# Patient Record
Sex: Female | Born: 1975
Health system: Southern US, Community
[De-identification: ages and names within clinical notes are randomized; demographics above are authoritative.]

## PROBLEM LIST (undated history)

## (undated) DIAGNOSIS — M9252 Juvenile osteochondrosis of tibia and fibula, left leg: Secondary | ICD-10-CM

## (undated) DIAGNOSIS — K0889 Other specified disorders of teeth and supporting structures: Secondary | ICD-10-CM

## (undated) DIAGNOSIS — E282 Polycystic ovarian syndrome: Secondary | ICD-10-CM

## (undated) DIAGNOSIS — G44009 Cluster headache syndrome, unspecified, not intractable: Secondary | ICD-10-CM

## (undated) DIAGNOSIS — D259 Leiomyoma of uterus, unspecified: Secondary | ICD-10-CM

## (undated) DIAGNOSIS — E119 Type 2 diabetes mellitus without complications: Secondary | ICD-10-CM

## (undated) DIAGNOSIS — K219 Gastro-esophageal reflux disease without esophagitis: Secondary | ICD-10-CM

## (undated) DIAGNOSIS — M92522 Juvenile osteochondrosis of tibia tubercle, left leg: Secondary | ICD-10-CM

## (undated) DIAGNOSIS — M1990 Unspecified osteoarthritis, unspecified site: Secondary | ICD-10-CM

## (undated) DIAGNOSIS — J302 Other seasonal allergic rhinitis: Secondary | ICD-10-CM

## (undated) HISTORY — DX: Morbid (severe) obesity due to excess calories: E66.01

## (undated) HISTORY — PX: EXTERNAL FIXATION ARM: SHX1552

## (undated) HISTORY — PX: ABDOMINAL HYSTERECTOMY: SHX81

## (undated) HISTORY — PX: WISDOM TOOTH EXTRACTION: SHX21

## (undated) HISTORY — DX: Polycystic ovarian syndrome: E28.2

---

## 1998-10-24 ENCOUNTER — Emergency Department (HOSPITAL_COMMUNITY): Admission: EM | Admit: 1998-10-24 | Discharge: 1998-10-25 | Payer: Self-pay | Admitting: Emergency Medicine

## 2000-06-11 ENCOUNTER — Encounter: Payer: Self-pay | Admitting: Emergency Medicine

## 2000-06-11 ENCOUNTER — Emergency Department (HOSPITAL_COMMUNITY): Admission: EM | Admit: 2000-06-11 | Discharge: 2000-06-11 | Payer: Self-pay | Admitting: Emergency Medicine

## 2000-08-29 ENCOUNTER — Emergency Department (HOSPITAL_COMMUNITY): Admission: EM | Admit: 2000-08-29 | Discharge: 2000-08-29 | Payer: Self-pay | Admitting: Emergency Medicine

## 2001-04-18 ENCOUNTER — Emergency Department (HOSPITAL_COMMUNITY): Admission: EM | Admit: 2001-04-18 | Discharge: 2001-04-18 | Payer: Self-pay | Admitting: Emergency Medicine

## 2002-07-11 ENCOUNTER — Emergency Department (HOSPITAL_COMMUNITY): Admission: EM | Admit: 2002-07-11 | Discharge: 2002-07-11 | Payer: Self-pay | Admitting: Emergency Medicine

## 2002-09-01 ENCOUNTER — Emergency Department (HOSPITAL_COMMUNITY): Admission: EM | Admit: 2002-09-01 | Discharge: 2002-09-01 | Payer: Self-pay | Admitting: Emergency Medicine

## 2003-05-29 ENCOUNTER — Emergency Department (HOSPITAL_COMMUNITY): Admission: EM | Admit: 2003-05-29 | Discharge: 2003-05-29 | Payer: Self-pay | Admitting: Emergency Medicine

## 2005-05-14 ENCOUNTER — Emergency Department (HOSPITAL_COMMUNITY): Admission: EM | Admit: 2005-05-14 | Discharge: 2005-05-15 | Payer: Self-pay | Admitting: Emergency Medicine

## 2006-08-13 ENCOUNTER — Inpatient Hospital Stay (HOSPITAL_COMMUNITY): Admission: EM | Admit: 2006-08-13 | Discharge: 2006-08-15 | Payer: Self-pay | Admitting: Emergency Medicine

## 2006-08-13 ENCOUNTER — Ambulatory Visit: Payer: Self-pay | Admitting: Internal Medicine

## 2006-08-13 ENCOUNTER — Ambulatory Visit: Payer: Self-pay | Admitting: Infectious Diseases

## 2006-09-01 ENCOUNTER — Ambulatory Visit: Payer: Self-pay | Admitting: Internal Medicine

## 2006-10-20 ENCOUNTER — Ambulatory Visit: Payer: Self-pay | Admitting: Internal Medicine

## 2006-11-30 ENCOUNTER — Ambulatory Visit: Payer: Self-pay | Admitting: Internal Medicine

## 2006-12-07 ENCOUNTER — Ambulatory Visit: Payer: Self-pay | Admitting: Infectious Diseases

## 2007-07-15 ENCOUNTER — Inpatient Hospital Stay (HOSPITAL_COMMUNITY): Admission: AD | Admit: 2007-07-15 | Discharge: 2007-07-15 | Payer: Self-pay | Admitting: Obstetrics & Gynecology

## 2007-07-29 ENCOUNTER — Ambulatory Visit: Payer: Self-pay | Admitting: Obstetrics and Gynecology

## 2007-07-29 ENCOUNTER — Encounter: Payer: Self-pay | Admitting: Obstetrics and Gynecology

## 2007-08-22 ENCOUNTER — Telehealth (INDEPENDENT_AMBULATORY_CARE_PROVIDER_SITE_OTHER): Payer: Self-pay | Admitting: Internal Medicine

## 2007-08-26 ENCOUNTER — Ambulatory Visit: Payer: Self-pay | Admitting: Hospitalist

## 2007-08-26 ENCOUNTER — Telehealth (INDEPENDENT_AMBULATORY_CARE_PROVIDER_SITE_OTHER): Payer: Self-pay | Admitting: *Deleted

## 2007-08-26 DIAGNOSIS — IMO0002 Reserved for concepts with insufficient information to code with codable children: Secondary | ICD-10-CM

## 2007-09-03 ENCOUNTER — Ambulatory Visit: Payer: Self-pay | Admitting: Internal Medicine

## 2007-09-03 ENCOUNTER — Encounter (INDEPENDENT_AMBULATORY_CARE_PROVIDER_SITE_OTHER): Payer: Self-pay | Admitting: *Deleted

## 2007-11-04 ENCOUNTER — Ambulatory Visit: Payer: Self-pay | Admitting: Internal Medicine

## 2007-11-04 ENCOUNTER — Encounter (INDEPENDENT_AMBULATORY_CARE_PROVIDER_SITE_OTHER): Payer: Self-pay | Admitting: Internal Medicine

## 2007-11-04 DIAGNOSIS — R5383 Other fatigue: Secondary | ICD-10-CM

## 2007-11-04 DIAGNOSIS — E282 Polycystic ovarian syndrome: Secondary | ICD-10-CM

## 2007-11-04 DIAGNOSIS — R5381 Other malaise: Secondary | ICD-10-CM

## 2007-11-04 LAB — CONVERTED CEMR LAB
Glucose, Urine, Semiquant: NEGATIVE
Protein, U semiquant: NEGATIVE
Specific Gravity, Urine: >=1.03
Urobilinogen, UA: 0.2
WBC Urine, dipstick: NEGATIVE

## 2007-11-11 ENCOUNTER — Ambulatory Visit: Payer: Self-pay | Admitting: Internal Medicine

## 2007-11-11 ENCOUNTER — Encounter (INDEPENDENT_AMBULATORY_CARE_PROVIDER_SITE_OTHER): Payer: Self-pay | Admitting: Internal Medicine

## 2007-11-12 LAB — CONVERTED CEMR LAB
LH: 3.1 milliintl units/mL
Prolactin: 8.7 ng/mL

## 2007-11-16 ENCOUNTER — Ambulatory Visit (HOSPITAL_COMMUNITY): Admission: RE | Admit: 2007-11-16 | Discharge: 2007-11-16 | Payer: Self-pay | Admitting: Internal Medicine

## 2007-11-16 ENCOUNTER — Ambulatory Visit: Payer: Self-pay | Admitting: Hospitalist

## 2007-11-16 LAB — CONVERTED CEMR LAB: Blood Glucose, Fingerstick: 91

## 2007-11-24 ENCOUNTER — Telehealth: Payer: Self-pay | Admitting: *Deleted

## 2007-11-25 ENCOUNTER — Ambulatory Visit: Payer: Self-pay | Admitting: Internal Medicine

## 2008-01-27 ENCOUNTER — Telehealth: Payer: Self-pay | Admitting: *Deleted

## 2008-02-01 ENCOUNTER — Ambulatory Visit: Payer: Self-pay | Admitting: Internal Medicine

## 2008-02-01 DIAGNOSIS — L0390 Cellulitis, unspecified: Secondary | ICD-10-CM

## 2008-02-01 DIAGNOSIS — R634 Abnormal weight loss: Secondary | ICD-10-CM | POA: Insufficient documentation

## 2008-02-01 DIAGNOSIS — L0291 Cutaneous abscess, unspecified: Secondary | ICD-10-CM | POA: Insufficient documentation

## 2008-02-02 ENCOUNTER — Encounter (INDEPENDENT_AMBULATORY_CARE_PROVIDER_SITE_OTHER): Payer: Self-pay | Admitting: Infectious Diseases

## 2008-02-02 ENCOUNTER — Ambulatory Visit: Payer: Self-pay | Admitting: Internal Medicine

## 2008-02-02 LAB — CONVERTED CEMR LAB
Cholesterol: 165 mg/dL (ref 0–200)
Potassium: 4.3 meq/L (ref 3.5–5.3)
Sodium: 136 meq/L (ref 135–145)
Total CHOL/HDL Ratio: 4.9
Triglycerides: 198 mg/dL — ABNORMAL HIGH (ref <150)
VLDL: 40 mg/dL (ref 0–40)

## 2008-04-13 ENCOUNTER — Telehealth (INDEPENDENT_AMBULATORY_CARE_PROVIDER_SITE_OTHER): Payer: Self-pay | Admitting: *Deleted

## 2008-04-21 ENCOUNTER — Emergency Department (HOSPITAL_COMMUNITY): Admission: EM | Admit: 2008-04-21 | Discharge: 2008-04-21 | Payer: Self-pay | Admitting: Family Medicine

## 2008-04-24 ENCOUNTER — Telehealth: Payer: Self-pay | Admitting: *Deleted

## 2008-04-24 ENCOUNTER — Ambulatory Visit: Payer: Self-pay | Admitting: Internal Medicine

## 2008-04-25 ENCOUNTER — Encounter (INDEPENDENT_AMBULATORY_CARE_PROVIDER_SITE_OTHER): Payer: Self-pay | Admitting: Internal Medicine

## 2008-04-26 ENCOUNTER — Encounter (INDEPENDENT_AMBULATORY_CARE_PROVIDER_SITE_OTHER): Payer: Self-pay | Admitting: *Deleted

## 2008-05-01 ENCOUNTER — Encounter (INDEPENDENT_AMBULATORY_CARE_PROVIDER_SITE_OTHER): Payer: Self-pay | Admitting: Internal Medicine

## 2008-05-04 ENCOUNTER — Ambulatory Visit: Payer: Self-pay | Admitting: *Deleted

## 2008-09-04 ENCOUNTER — Encounter (INDEPENDENT_AMBULATORY_CARE_PROVIDER_SITE_OTHER): Payer: Self-pay | Admitting: Internal Medicine

## 2008-09-04 ENCOUNTER — Ambulatory Visit: Payer: Self-pay | Admitting: Internal Medicine

## 2008-09-04 DIAGNOSIS — R109 Unspecified abdominal pain: Secondary | ICD-10-CM | POA: Insufficient documentation

## 2008-09-04 LAB — HM PAP SMEAR: HM Pap smear: NEGATIVE

## 2008-09-04 LAB — CONVERTED CEMR LAB
CO2: 20 meq/L (ref 19–32)
Calcium: 9.1 mg/dL (ref 8.4–10.5)
Free T4: 1.16 ng/dL (ref 0.89–1.80)
Glucose, Bld: 88 mg/dL (ref 70–99)
HCT: 43.7 % (ref 36.0–46.0)
MCV: 86 fL (ref 78.0–100.0)
Prolactin: 5.1 ng/mL
RBC: 5.08 M/uL (ref 3.87–5.11)
Sodium: 139 meq/L (ref 135–145)
TSH: 2.373 microintl units/mL (ref 0.350–4.50)
Total Bilirubin: 0.5 mg/dL (ref 0.3–1.2)
Total Protein: 7.2 g/dL (ref 6.0–8.3)
WBC: 10.1 10*3/uL (ref 4.0–10.5)

## 2008-09-05 ENCOUNTER — Encounter (INDEPENDENT_AMBULATORY_CARE_PROVIDER_SITE_OTHER): Payer: Self-pay | Admitting: Internal Medicine

## 2008-09-05 LAB — CONVERTED CEMR LAB: Gardnerella vaginalis: NEGATIVE

## 2008-09-13 ENCOUNTER — Encounter (INDEPENDENT_AMBULATORY_CARE_PROVIDER_SITE_OTHER): Payer: Self-pay | Admitting: Internal Medicine

## 2008-09-19 LAB — CONVERTED CEMR LAB: Volume, Urine-CORTUR: 1700 mL

## 2009-03-21 ENCOUNTER — Telehealth (INDEPENDENT_AMBULATORY_CARE_PROVIDER_SITE_OTHER): Payer: Self-pay | Admitting: Internal Medicine

## 2009-03-27 ENCOUNTER — Ambulatory Visit: Payer: Self-pay | Admitting: Internal Medicine

## 2009-03-27 ENCOUNTER — Encounter: Payer: Self-pay | Admitting: Internal Medicine

## 2009-03-28 ENCOUNTER — Encounter: Payer: Self-pay | Admitting: Internal Medicine

## 2009-03-28 LAB — CONVERTED CEMR LAB: Beta hcg, urine, semiquantitative: NEGATIVE

## 2009-03-29 ENCOUNTER — Telehealth: Payer: Self-pay | Admitting: *Deleted

## 2010-03-28 ENCOUNTER — Telehealth: Payer: Self-pay | Admitting: Internal Medicine

## 2010-08-05 ENCOUNTER — Ambulatory Visit: Payer: Self-pay | Admitting: Internal Medicine

## 2010-08-05 DIAGNOSIS — K047 Periapical abscess without sinus: Secondary | ICD-10-CM

## 2010-08-16 ENCOUNTER — Telehealth: Payer: Self-pay | Admitting: *Deleted

## 2011-01-23 NOTE — Progress Notes (Signed)
Summary: refill/gg  Phone Note Refill Request  on Lauria  7, 2011 11:30 AM  Refills Requested: Medication #1:  ULTRAM 50 MG  TABS take one every 4 hours as needed.   Last Refilled: 12/12/2009  Method Requested: Electronic Initial call taken by: Merrie Roof RN,  Mazel  7, 2011 11:30 AM  Follow-up for Phone Call        completed Follow-up by: Mliss Sax MD,  Cyndal  7, 2011 1:14 PM    Prescriptions: ULTRAM 50 MG  TABS (TRAMADOL HCL) take one every 4 hours as needed  #60 x 1   Entered and Authorized by:   Mliss Sax MD   Signed by:   Mliss Sax MD on 03/28/2010   Method used:   Electronically to        [facility] [phone]* (retail)       [address]       [place], [place]  [address]       Ph: [phone]       Fax: [phone]   RxID:   [id number]

## 2011-01-23 NOTE — Assessment & Plan Note (Signed)
Summary: ACUTE/MAGICK/DENTAL PAIN/CH   Vital Signs:  Patient profile:   35 year old female Height:      63.5 inches (161.29 cm) Weight:      271.9 pounds (123.59 kg) BMI:     47.58 Temp:     99.6 degrees F oral Pulse rate:   88 / minute BP sitting:   111 / 70  (right arm)  Vitals Entered By: Chinita Pester RN (August 05, 2010 4:05 PM) CC: Tooth abscess x 2 for 2 months.  Request  ABX/pain med. Needs referral and see Jaynee Eagles to renew card. Is Patient Diabetic? No Pain Assessment Patient in pain? yes     Location: teeth Intensity: 9 Type: aching Onset of pain  Intermittent Nutritional Status BMI of > 30 = obese  Have you ever been in a relationship where you felt threatened, hurt or afraid?No   Does patient need assistance? Functional Status Self care Ambulation Normal   Primary Care Provider:  Carlus Pavlov MD  CC:  Tooth abscess x 2 for 2 months.  Request  ABX/pain med. Needs referral and see Jaynee Eagles to renew card..  History of Present Illness: 35 yo female presents for two really bad abscess toothache for about 1 week.  She was given a trial of Tramadol and Penicillin without any relief from [facility].  She has always been unresponsive to PCN in the past.   She was told to get a referral to see a dentist.    She has a fever off and on, last night 101 with chills.  Neck and shoulder stiffness and cannot eat or sleep in the past week.  She does have a terrible migraine with her period.  Denies N/V, SOB.  Depression History:      The patient denies a depressed mood most of the day and a diminished interest in her usual daily activities.         Preventive Screening-Counseling & Management  Alcohol-Tobacco     Alcohol drinks/day: 3     Alcohol type: SOCIALLY     Smoking Status: current     Packs/Day: 1/2-1     Year Started: started at age 49  Caffeine-Diet-Exercise     Does Patient Exercise: no  Allergies: 1)  ! * Iv Codiene  Review of  Systems       The patient complains of headaches.    Physical Exam  General:  alert, well-developed, well-nourished, and well-hydrated.  alert, well-developed, well-nourished, and well-hydrated.   Mouth:  1 abscess on upper and 1 on lower gingiva with cavities and white exudates and tenderness.  Multiple cavities. Lungs:  normal respiratory effort, no intercostal retractions, no accessory muscle use, normal breath sounds, no crackles, and no wheezes.   Heart:  normal rate, regular rhythm, no murmur, no gallop, no rub, and no JVD.   Abdomen:  soft, non-tender, normal bowel sounds, no distention, no masses, and no guarding.   Neurologic:  alert & oriented X3 and cranial nerves II-XII intact.     Impression & Recommendations:  Problem # 1:  ABSCESS, TOOTH (ICD-522.5) Two tooth abscesses with white exudates.  Tenderness on right mandibular and neck.   Give Clindamycin 150mg  three tablets three times a day x 9 days.   Give Vicodin 5/500 mg take 1-2 tablets by mouth every 4-6 hours as needed for pain Patient will be referred to Dental clinic.  She is waiting for her orange card to be reactivated.  Orders: Dental Referral (  Dentist)  Complete Medication List: 1)  Glucophage 500 Mg Tabs (Metformin hcl) .... Take one tablet three times a day with meals. 2)  Ultram 50 Mg Tabs (Tramadol hcl) .... Take one every 4 hours as needed 3)  Vicodin 5-500 Mg Tabs (Hydrocodone-acetaminophen) .Marland Kitchen.. 1-2 tablet by mouth every 4-6 hours as needed for tooth pain 4)  Cleocin 150 Mg Caps (Clindamycin hcl) .... Take 3 tablets by mouth three times a day  Patient Instructions: 1)  1. Take Clindamycin 150mg  3 tablets 3 times per day x 9 days 2)  2. Take Vicodin 5/500mg  one to two tablets by mouth every 4-6 hours as needed for pain 3)  3. Refer to Dental Clinic Prescriptions: VICODIN 5-500 MG TABS (HYDROCODONE-ACETAMINOPHEN) 1-2 tablet by mouth every 4-6 hours as needed for tooth pain  #60 x 0   Entered and  Authorized by:   Rosana Berger MD   Signed by:   Rosana Berger MD on 08/05/2010   Method used:   Printed then faxed to ...       [facility]  [address] # [phone]* (retail)       [phone]       [place], [place]  [address]       Ph: [phone]       Fax: [phone]   RxID:   [id number] CLEOCIN 150 MG CAPS (CLINDAMYCIN HCL) take 3 tablets by mouth three times a day  #81 x 0   Entered and Authorized by:   Rosana Berger MD   Signed by:   Rosana Berger MD on 08/05/2010   Method used:   Printed then faxed to ...       [facility]  [address] # [phone]* (retail)       [phone] [address]       [place], [place]  [address]       Ph: [phone]       Fax: [phone]   RxID:   [id number]   Prevention & Chronic Care Immunizations   Influenza vaccine: Not documented    Tetanus booster: Not documented    Pneumococcal vaccine: Not documented  Other Screening   Pap smear:  Specimen Adequacy: Satisfactory for evaluation.   Interpretation/Result:Negative for intraepithelial Lesion or Malignancy.     (09/04/2008)   Pap smear due: 08/2009   Smoking status: current  (08/05/2010)   Smoking cessation counseling: yes  (03/27/2009)

## 2011-01-23 NOTE — Progress Notes (Signed)
Summary: dental/ hla  Phone Note Call from Patient   Summary of Call: pt called to ask if dental referral had been done, informed it had and that there is a waiting list and if she cannot wait, possibly another appt in clinic will be needed, she states she will give it some more time and call back if needed Initial call taken by: Marin Roberts RN,  August 16, 2010 12:32 PM

## 2011-02-15 ENCOUNTER — Encounter: Payer: Self-pay | Admitting: Internal Medicine

## 2011-05-06 NOTE — Group Therapy Note (Signed)
NAME:  Whitney Wagner, Whitney Wagner NO.:  [id number]   MEDICAL RECORD NO.:  [id number]          PATIENT TYPE:  WOC   LOCATION:  [facility]                   FACILITY:  [facility]   PHYSICIAN:  Argentina Donovan, MD        DATE OF BIRTH:  01-04-1976   DATE OF SERVICE:  07/29/2007                                  CLINIC NOTE   The patient is a 35 year old gravida 1, para 0-0-1-0, pregnancy 10 years  ago, who comes in having had a cyst on the labia that has since solved  and is just in for a followup. She has had a long history of infections  with MRSA and therefore does not shave her legs.   She said she weighs 269 pounds at 5 feet, 2 inches tall. Blood pressure  is normal at 135/90, pulse 97. Temperature 98.6.  BREASTS: Large pendulous, symmetrical with no masses. No nipple  discharge.  ABDOMEN: Soft and nontender. No masses or organomegaly.  EXTERNAL GENITALIA: Normal.  BUS within normal limits. Vagina is clean,  well-rugated. Cervix is clean. Nulliparous. Uterus is normal size, shape  and consistency. The adnexa could not be well palpated due to the  habitus of the patient. She has many scars on her buttocks and around  the groin area secondary to previous MRSA infection and abscesses.           ______________________________  Argentina Donovan, MD     PR/MEDQ  D:  07/29/2007  T:  07/29/2007  Job:  [id number]

## 2011-05-09 NOTE — Discharge Summary (Signed)
NAME:  Whitney Wagner, Whitney Wagner             ACCOUNT NO.:  [id number]   MEDICAL RECORD NO.:  [id number]          PATIENT TYPE:  INP   LOCATION:  5153                         FACILITY:  [facility]   PHYSICIAN:  C. Ulyess Mort, M.D.DATE OF BIRTH:  19-Aug-1976   DATE OF ADMISSION:  08/13/2006  DATE OF DISCHARGE:  08/15/2006                                 DISCHARGE SUMMARY   ALLERGIES:  CODEINE GIVES PATIENT HALLUCINATIONS.   DISCHARGE DIAGNOSES:  1. Right forearm cellulitis.  2. 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   DISCHARGE MEDICATIONS:  1. TMP-SMX DS 2 tabs p.o. b.i.d. x 10 days  2. Vicodin 5/325 1 tab PO q 4 hours PRN  3. antibacterial soap.   DISPOSITION AND FOLLOWUP:  1. Followup appointment in infectious diseases outpatient clinic with Dr.      Roxan Hockey at [facility].  2. Followup appointment in internal medicine outpatient clinic with Dr.      Reynold Bowen on September 01, 2006 at 3:30 PM.  Check for resolution of      cellulitis and appearance of new lesions.  Assess whether or not more      antibiotic therapy is warranted.   PROCEDURES PERFORMED:  1. EKG showed normal sinus rhythm.  2. CT scan of the right forearm:  Skin and underlying subcutaneous fatty      inflammatory change along the radial side of the forearm distally.  No      evidence of drainable collection or deep space infection.  3. Irrigation and drainage of wrist abscess on the right arm on August 15, 2006.   CONSULTATIONS:  1. Infectious disease, [place]. Roxan Hockey, M.D.  2. General surgery, [place]. Daphine Deutscher, M.D.   ADMITTING HISTORY AND PHYSICAL:  A 35 year old woman with past medical  history significant recurrent MRSA skin infections.  The last outbreak was  in May 2007, at which time patient was put on doxycycline plus Bactrim and  later required an I and D of the right abdominal wall in June 2007 at [facility].  She then had a course of Zyvox.  Lesions reappeared  within  2 weeks of antibiotic discontinuation with a rapidly progressing one on  right forearm.  The extremity was red, warm, indurated, and painful.  Patient still had full usage of her extremity.  No fever, chills, or night  sweats, but had been feeling tired.  Lost 20 pounds since May in the setting  of recurrent skin infections.  Patient had flea-infected cats living inside  her apartment.   PHYSICAL EXAMINATION:  VITAL SIGNS:  Temperature 97.3, blood pressure  112/73, heart rate 76, O2 saturation 99% on room air.  GENERAL:  No acute distress.  HEENT:  PERRLA, EOMI.  No adenopathy.  No thyromegaly.  LUNGS:  CTAB.  CARDIOVASCULAR:  Regular rate and rhythm.  Normal S1, normal S2.  No murmur,  rub, or gallop.  ABDOMEN:  Obese.  Bowel sounds positive.  Soft, nontender, nondistended, no  palpable masses.  EXTREMITIES:  No edema.  Good pulses bilaterally.  SKIN:  Multiple lesions with central scab and  small pus pocket as well as  peripheral erythema plus induration located on abdomen, hips, and legs.  A  similar lesion was observed on the right forearm with a 2 to 3 cm central  induration and about a 10 x 5 cm peripheral erythema surrounding the scab.  LYMPH:  No palpable nodes in axilla or inguinal zones.  NEUROLOGICAL:  Grossly intact and nonfocal.   LABORATORY DATA:  Sodium 138, potassium 3.9, chloride 106, bicarbonate 24,  BUN 6, creatinine 0.7, glucose 120.  White blood count 11.4, hemoglobin  14.5, platelets 331,000, hematocrit 42.3, ANC 8.2, MCV 88.  Bilirubin 0.5,  alkaline phosphatase 51, AST 24, ALT 30, protein 6.7, albumin 3.4, calcium  9.   HOSPITAL COURSE:  Right forearm cellulitis.  Patient was put on IV  vancomycin and Zosyn (piperacillin/tazobactam) empirically.  Underlying  abscess was drained by Dr. Daphine Deutscher with immediate relief of pressure pains on  day #3 of IV antibiotics.  No packing was necessary.  Irrigation and  drainage provided significant symptomatic  relief. Despite the fact that her  cellulitis had somewhat worsened and that she was running a low-grade fever  (was up to 99.0 F when she was discharged), patient insisted that she be  discharged because she was in nicotine withdrawal.   DISCHARGE LABORATORY DATA:  (August 14, 2006)  White blood count 10.2,  hemoglobin 14.2, hematocrit 41.6, platelet count 308,000.  Sodium 140,  potassium 3.9, chloride 108, bicarbonate 27, BUN 5, creatinine 0.8.   VITAL SIGNS:  Temperature 99, heart rate 83 beats per minute, blood pressure  117/68, O2 saturation 96% on room air.      Olene Craven, M.D.  Electronically Signed      C. Ulyess Mort, M.D.  Electronically Signed    MC/MEDQ  D:  08/19/2006  T:  08/19/2006  Job:  [id number]   cc:   [place]. Flavia Shipper., M.D.  [place] Daphine Deutscher, MD

## 2011-05-09 NOTE — Op Note (Signed)
NAME:  Whitney Wagner, Whitney Wagner             ACCOUNT NO.:  [id number]   MEDICAL RECORD NO.:  [id number]          PATIENT TYPE:  INP   LOCATION:  5153                         FACILITY:  [facility]   PHYSICIAN:  [place]. Daphine Deutscher, MD  DATE OF BIRTH:  09-30-1976   DATE OF PROCEDURE:  DATE OF DISCHARGE:  08/15/2006                                 OPERATIVE REPORT   CHIEF COMPLAINT:  History of MRSA infections, recurrent.   PROCEDURE:  I&D of wrist abscess, right arm.   SURGEON:  Dr. Daphine Deutscher   DESCRIPTION OF PROCEDURE:  [name] is a 35 year old white female who  has had multiple recurrent MRSA abscesses and was recently discharge from  [facility] after an abdominal MRSA infection was treated.  She is  admitted here with pain in her arm and over the course of 2 days has  developed bright red, tender area on the wrist.  Despite a CT scan that said  this was not abscess, she was felt to have an abscess by Dr. Roxan Hockey.  I  was called to see her.   I prepped the area with Betadine.  I injected it with 1% lidocaine with a  small needle.  I then unroofed an eschar that was in the midst of this  erythematous region, and then cut down and got pus.  This pus had a grayish  white appearance and was non foul-smelling and appeared like most of MRSA  pus that I have encountered.  The patient achieved immediate relief of pain.  I massaged this and then gently evacuated all I could from this area.  I  dressed it to enable it to drain open into the dressing.  No packing was  used.  The patient will be discharged home on antibiotics and will be  following up with Dr. Roxan Hockey in the ID Clinic.      [place] Daphine Deutscher, MD  Electronically Signed     MBM/MEDQ  D:  08/15/2006  T:  08/17/2006  Job:  [id number]   cc:   [place]. Flavia Shipper., M.D.

## 2011-05-22 ENCOUNTER — Ambulatory Visit (INDEPENDENT_AMBULATORY_CARE_PROVIDER_SITE_OTHER): Payer: Self-pay | Admitting: Internal Medicine

## 2011-05-22 ENCOUNTER — Encounter: Payer: Self-pay | Admitting: Internal Medicine

## 2011-05-22 ENCOUNTER — Telehealth: Payer: Self-pay | Admitting: *Deleted

## 2011-05-22 VITALS — BP 120/60 | HR 108 | Temp 98.4°F | Ht 63.0 in | Wt 265.5 lb

## 2011-05-22 DIAGNOSIS — L0390 Cellulitis, unspecified: Secondary | ICD-10-CM

## 2011-05-22 DIAGNOSIS — A4902 Methicillin resistant Staphylococcus aureus infection, unspecified site: Secondary | ICD-10-CM

## 2011-05-22 DIAGNOSIS — B9562 Methicillin resistant Staphylococcus aureus infection as the cause of diseases classified elsewhere: Secondary | ICD-10-CM | POA: Insufficient documentation

## 2011-05-22 DIAGNOSIS — L0291 Cutaneous abscess, unspecified: Secondary | ICD-10-CM

## 2011-05-22 MED ORDER — SULFAMETHOXAZOLE-TMP DS 800-160 MG PO TABS: 1.0000 | ORAL_TABLET | Freq: Two times a day (BID) | ORAL | Status: AC

## 2011-05-22 MED ORDER — TRAMADOL HCL 50 MG PO TABS: 50.0000 mg | ORAL_TABLET | Freq: Four times a day (QID) | ORAL | Status: DC | PRN

## 2011-05-22 NOTE — Assessment & Plan Note (Signed)
Considering the current and past history and examination showing induration on the medial part of the cellulitis compared to the lateral and no signs of pus drainage now, we'll give her Bactrim double strength twice a day for 10 days for presumed MRSA cellulitis. Explained her to call the clinic if she develops better in next 2 weeks or after completing the antibiotic course. She was understanding and said that she would definitely call because she had this infection multiple times in the past and would know if it's not getting better.

## 2011-05-22 NOTE — Telephone Encounter (Signed)
appt was given

## 2011-05-22 NOTE — Patient Instructions (Signed)
Please make a followup appointment in 3-4 months. If your infection is not controlled in next 2 weeks after completion of Bactrim for 10 days, call the clinic and make an appointment so we can do further procedures. Please take some Advil 400 mg 3 times a day to help the inflammation along with tramadol.

## 2011-05-22 NOTE — Telephone Encounter (Signed)
Pt calls and states she has a boil, cannot talk due to being in workplace, hx MRSA

## 2011-05-22 NOTE — Progress Notes (Signed)
  Subjective:    Patient ID: Whitney Wagner, female    DOB: 05/18/1976, 35 y.o.   MRN: [id number]  HPI Ms. Piasecki is a pleasant 35 year old woman with past medical history of recurrent skin infections who comes to the clinic with right flank boil and rash. She noticed right flank bump on last Saturday, which was tender. There is no rash at that time but then she developed redness in the area which spread laterally and now it's about 8 x 4 cm. She says that this is usual for her to start with a boil than get the rash and she gets antibiotics namely doxycycline or Bactrim, which doesn't work and she develops abscess and finally has to get it drained by I&D. She has been hospitalized for cellulitis requiring IV vancomycin and Zosyn and also Zyvox once. She is nondiabetic but is morbidly obese. She denies any fever, chills, night sweats, recent weight loss, chest pain, shortness of breath, nausea, vomiting, headache or abdominal pain. She does have a few more coils on her right leg but no signs of cellulitis. She uses topical lidocaine spray for it.   Review of Systems    as per history of present illness Objective:   Physical Exam    Constitutional: Vital signs reviewed.  Patient is a well-developed and well-nourished in no acute distress and cooperative with exam. Alert and oriented x3.  Head: Normocephalic and atraumatic Mouth: no erythema or exudates, MMM Eyes: PERRL, EOMI, conjunctivae normal, No scleral icterus.  Neck: Supple, Trachea midline normal ROM, No JVD  Cardiovascular: RRR, S1 normal, S2 normal, no MRG Pulmonary/Chest: CTAB, no wheezes, rales, or rhonchi Abdominal: Soft. Non-tender, non-distended, bowel sounds are normal, no masses, organomegaly, or guarding present.  Musculoskeletal: No joint deformities, erythema, or stiffness, ROM full and no nontender Hematology: no cervical, inginal, or axillary adenopathy.  Neurological: A&O x3, Strenght is normal and symmetric  bilaterally, cranial nerve II-XII are grossly intact, no focal motor deficit, sensory intact to light touch bilaterally.  Skin: Warm, dry and intact. Right flank 8 x 4 cm cellulitis with folliculitis on the medial end of the rash. No open wound or drainage. Moderate tenderness to light touch.  Psychiatric: Normal mood and affect. speech and behavior is normal. Judgment and thought content normal. Cognition and memory are normal.       Assessment & Plan:

## 2011-05-23 ENCOUNTER — Emergency Department (HOSPITAL_COMMUNITY)
Admission: EM | Admit: 2011-05-23 | Discharge: 2011-05-23 | Disposition: A | Discharge status: Home / Self Care | Payer: Self-pay | Attending: Emergency Medicine | Admitting: Emergency Medicine

## 2011-05-23 ENCOUNTER — Emergency Department (HOSPITAL_COMMUNITY): Payer: Self-pay

## 2011-05-23 DIAGNOSIS — R079 Chest pain, unspecified: Secondary | ICD-10-CM | POA: Insufficient documentation

## 2011-05-23 DIAGNOSIS — E669 Obesity, unspecified: Secondary | ICD-10-CM | POA: Insufficient documentation

## 2011-05-23 DIAGNOSIS — R112 Nausea with vomiting, unspecified: Secondary | ICD-10-CM | POA: Insufficient documentation

## 2011-05-23 DIAGNOSIS — L03319 Cellulitis of trunk, unspecified: Secondary | ICD-10-CM | POA: Insufficient documentation

## 2011-05-23 DIAGNOSIS — K5900 Constipation, unspecified: Secondary | ICD-10-CM | POA: Insufficient documentation

## 2011-05-23 DIAGNOSIS — R05 Cough: Secondary | ICD-10-CM | POA: Insufficient documentation

## 2011-05-23 DIAGNOSIS — R059 Cough, unspecified: Secondary | ICD-10-CM | POA: Insufficient documentation

## 2011-05-23 DIAGNOSIS — K2990 Gastroduodenitis, unspecified, without bleeding: Secondary | ICD-10-CM | POA: Insufficient documentation

## 2011-05-23 DIAGNOSIS — K2970 Gastritis, unspecified, without bleeding: Secondary | ICD-10-CM | POA: Insufficient documentation

## 2011-05-23 DIAGNOSIS — L02219 Cutaneous abscess of trunk, unspecified: Secondary | ICD-10-CM | POA: Insufficient documentation

## 2011-05-23 LAB — POCT PREGNANCY, URINE: Preg Test, Ur: NEGATIVE

## 2011-05-23 LAB — COMPREHENSIVE METABOLIC PANEL
ALT: 34 U/L (ref 0–35)
AST: 24 U/L (ref 0–37)
Calcium: 9.1 mg/dL (ref 8.4–10.5)
Creatinine, Ser: 0.54 mg/dL (ref 0.4–1.2)
GFR calc Af Amer: >60 mL/min (ref >60)
Sodium: 137 mEq/L (ref 135–145)
Total Protein: 7.2 g/dL (ref 6.0–8.3)

## 2011-05-23 LAB — TROPONIN I: Troponin I: <0.3 ng/mL (ref <0.30)

## 2011-05-23 LAB — CK TOTAL AND CKMB (NOT AT ARMC): CK, MB: 1.5 ng/mL (ref 0.3–4.0)

## 2011-05-23 LAB — CBC
Hemoglobin: 14.4 g/dL (ref 12.0–15.0)
MCHC: 35 g/dL (ref 30.0–36.0)
Platelets: 293 10*3/uL (ref 150–400)
RDW: 12.1 % (ref 11.5–15.5)

## 2011-08-25 ENCOUNTER — Encounter: Payer: Self-pay | Admitting: Internal Medicine

## 2011-11-21 ENCOUNTER — Ambulatory Visit (INDEPENDENT_AMBULATORY_CARE_PROVIDER_SITE_OTHER): Payer: Self-pay | Admitting: Internal Medicine

## 2011-11-21 ENCOUNTER — Encounter: Payer: Self-pay | Admitting: Internal Medicine

## 2011-11-21 DIAGNOSIS — R109 Unspecified abdominal pain: Secondary | ICD-10-CM

## 2011-11-21 DIAGNOSIS — B9562 Methicillin resistant Staphylococcus aureus infection as the cause of diseases classified elsewhere: Secondary | ICD-10-CM

## 2011-11-21 DIAGNOSIS — R51 Headache: Secondary | ICD-10-CM

## 2011-11-21 DIAGNOSIS — N949 Unspecified condition associated with female genital organs and menstrual cycle: Secondary | ICD-10-CM

## 2011-11-21 DIAGNOSIS — N938 Other specified abnormal uterine and vaginal bleeding: Secondary | ICD-10-CM

## 2011-11-21 DIAGNOSIS — L0390 Cellulitis, unspecified: Secondary | ICD-10-CM

## 2011-11-21 DIAGNOSIS — A4902 Methicillin resistant Staphylococcus aureus infection, unspecified site: Secondary | ICD-10-CM

## 2011-11-21 DIAGNOSIS — L0291 Cutaneous abscess, unspecified: Secondary | ICD-10-CM

## 2011-11-21 DIAGNOSIS — R519 Headache, unspecified: Secondary | ICD-10-CM | POA: Insufficient documentation

## 2011-11-21 MED ORDER — TRAMADOL HCL 50 MG PO TABS: 50.0000 mg | ORAL_TABLET | Freq: Four times a day (QID) | ORAL | Status: DC | PRN

## 2011-11-21 NOTE — Assessment & Plan Note (Signed)
May take Tramadol for headaches associated with heavy periods and PMS

## 2011-11-21 NOTE — Patient Instructions (Addendum)
An appointment will be made at the [facility].  You may take 2 pills of Tramadol every 6 hours as needed for your headaches, abdominal pain, and backaches.

## 2011-11-21 NOTE — Assessment & Plan Note (Signed)
Likely secondary to fibroids will refer to GYN at [facility]

## 2011-11-21 NOTE — Progress Notes (Signed)
  Subjective:    Patient ID: Whitney Wagner, female    DOB: June 26, 1976, 35 y.o.   MRN: [id number]  HPI Reports increased bleeding with periods over the past "year or so".  Mom had fibroids and had hysterectomy at about 35 years old.  Reports "erractic" bleeding, abdominal tenderness and back pains when on period. Period usually starts the first week of the month.  Of note, pt had vaginal ultrasound in 2008 for work-up of possible PCOS which did not demonstrate polycystic ovaries but did show pedunculated fibroid in anterior myometrium.  Reports headaches as well and requesting something stronger than tramadol since it "only takes the edge off". Pt has not seen a gynecologist but would like to "find one" and reports not wanting children the "can have a hysterectomy because I'm not using it anyway".  Her history is significant for chronic abdominal pain and recurrent cellulitis due to MRSA.  Review of Systems  Constitutional: Negative for fever and fatigue.  Respiratory: Negative for shortness of breath.   Genitourinary: Positive for hematuria, vaginal bleeding and pelvic pain.  Musculoskeletal: Positive for back pain.  Neurological: Positive for headaches. Negative for weakness.     Objective:   Physical Exam  Constitutional: She appears well-developed and well-nourished. No distress.  HENT:  Head: Normocephalic and atraumatic.  Cardiovascular: Normal rate, regular rhythm, normal heart sounds and intact distal pulses.   Pulmonary/Chest: Effort normal and breath sounds normal.  Abdominal: Soft. She exhibits no mass. There is tenderness. There is no rebound and no guarding.       Obese, tender to palpation in lower quandrants, left > right  Skin: Skin is warm and dry.       + hirsutism especially on arms          Assessment & Plan:  #1 dysfunctional uterine bleeding:  Pt ruled out for PCOS with prior workup including vaginal ultrasound which demonstrated pedunculated anterior  myometrium fibroid.  Likely increase in number and/or size of fibroids.    #2 abdominal pain and lower back pain: likely secondary to fibroid pain especially given increase in symptoms when patient on her period.  #3 will refer to [facility] for further evaluation and management  #4 declined flu shot

## 2012-03-05 ENCOUNTER — Telehealth: Payer: Self-pay | Admitting: *Deleted

## 2012-03-05 NOTE — Telephone Encounter (Signed)
Pt calls and states she has several of "the places" that look like MRSA, 1 on her R calf is large, red, hard and is tender to touch, others are either healing or very small. She cannot come to clinic today, appt is per chilonb. mon 3/18 at 0815. She is informed that she may use urg care or ED if needed this weekend, she is agreeable

## 2012-03-08 ENCOUNTER — Encounter: Payer: Self-pay | Admitting: Internal Medicine

## 2012-07-26 ENCOUNTER — Telehealth: Payer: Self-pay | Admitting: *Deleted

## 2012-07-26 NOTE — Telephone Encounter (Signed)
Pt calls and request appt, scheduled

## 2012-07-27 ENCOUNTER — Ambulatory Visit (INDEPENDENT_AMBULATORY_CARE_PROVIDER_SITE_OTHER): Payer: Self-pay | Admitting: Internal Medicine

## 2012-07-27 ENCOUNTER — Encounter: Payer: Self-pay | Admitting: Internal Medicine

## 2012-07-27 VITALS — BP 110/78 | HR 82 | Temp 98.5°F | Ht 62.0 in | Wt 259.1 lb

## 2012-07-27 DIAGNOSIS — B9562 Methicillin resistant Staphylococcus aureus infection as the cause of diseases classified elsewhere: Secondary | ICD-10-CM

## 2012-07-27 DIAGNOSIS — L089 Local infection of the skin and subcutaneous tissue, unspecified: Secondary | ICD-10-CM

## 2012-07-27 DIAGNOSIS — Z79899 Other long term (current) drug therapy: Secondary | ICD-10-CM

## 2012-07-27 DIAGNOSIS — A4902 Methicillin resistant Staphylococcus aureus infection, unspecified site: Secondary | ICD-10-CM

## 2012-07-27 DIAGNOSIS — Z8614 Personal history of Methicillin resistant Staphylococcus aureus infection: Secondary | ICD-10-CM

## 2012-07-27 LAB — POCT GLYCOSYLATED HEMOGLOBIN (HGB A1C): Hemoglobin A1C: 5.5

## 2012-07-27 LAB — GLUCOSE, CAPILLARY: Glucose-Capillary: 92 mg/dL (ref 70–99)

## 2012-07-27 MED ORDER — TRAMADOL HCL 50 MG PO TABS: 50.0000 mg | ORAL_TABLET | Freq: Four times a day (QID) | ORAL | Status: DC | PRN

## 2012-07-27 MED ORDER — SULFAMETHOXAZOLE-TRIMETHOPRIM 800-160 MG PO TABS: 1.0000 | ORAL_TABLET | Freq: Two times a day (BID) | ORAL | Status: AC

## 2012-07-27 NOTE — Patient Instructions (Addendum)
1. Will give your Tramadol 60 tablet without refill 2. Bactrim DS 10 day 3. Check your HIV and HGB A1C  Community-Associated MRSA CA-MRSA stands for community-associated methicillin-resistant Staphylococcus aureus. MRSA is a type of bacteria that is resistant to some common antibiotics. It can cause infections in the skin and many other places in the body. Staphylococcus aureus, often called "staph," is a bacteria that normally lives on the skin or in the nose. Staph on the surface of the skin or in the nose does not cause problems. However, if the staph enters the body through a cut, wound, or break in the skin, an infection can happen. Up until recently, infections with the MRSA type of staph mainly occurred in hospitals and other healthcare settings. There are now increasing problems with MRSA infections in the community as well. Infections with MRSA may be very serious or even life-threatening. CA-MRSA is becoming more common. It is known to spread in crowded settings, in jails and prisons, and in situations where there is close skin-to-skin contact, such as during sporting events or in locker rooms. MRSA can be spread through shared items, such as children's toys, razors, towels, or sports equipment.  CAUSES All staph, including MRSA, are normally harmless unless they enter the body through a scratch, cut, or wound, such as with surgery. All staph, including MRSA, can be spread from person-to-person by touching contaminated objects or through direct contact.  MRSA now causes illness in people who have not been in hospitals or other healthcare facilities. Cases of MRSA diseases in the community have been associated with:   Recent antibiotic use.   Sharing contaminated towels or clothes.   Having active skin diseases.   Participating in contact sports.   Living in crowded settings.   Intravenous (IV) drug use.   Community-associated MRSA infections are usually skin infections, but may  cause other severe illnesses.   Staph bacteria are one of the most common causes of skin infection. However, they are also a common cause of pneumonia, bone or joint infections, and bloodstream infections.  DIAGNOSIS Diagnosis of MRSA is done by cultures of fluid samples that may come from:  Swabs taken from cuts or wounds in infected areas.   Nasal swabs.   Saliva or deep cough specimens from the lungs (sputum).   Urine.   Blood.  Many people are "colonized" with MRSA but have no signs of infection. This means that people carry the MRSA germ on their skin or in their nose and may never develop MRSA infection.  TREATMENT  Treatment varies and is based on how serious, how deep, or how extensive the infection is. For example:  Some skin infections, such as a small boil or abscess, may be treated by draining yellowish-white fluid (pus) from the site of the infection.   Deeper or more widespread soft tissue infections are usually treated with surgery to drain pus and with antibiotic medicine given by vein or by mouth. This may be recommended even if you are pregnant.   Serious infections may require a hospital stay.  If antibiotics are given, they may be needed for several weeks. PREVENTION Because many people are colonized with staph, including MRSA, preventing the spread of the bacteria from person-to-person is most important. The best way to prevent the spread of bacteria and other germs is through proper hand washing or by using alcohol-based hand disinfectants. The following are other ways to help prevent MRSA infection within community settings.   Wash your hands  frequently with soap and water for at least 15 seconds. Otherwise, use alcohol-based hand disinfectants when soap and water is not available.   Make sure people who live with you wash their hands often, too.   Do not share personal items. For example, avoid sharing razors and other personal hygiene items, towels, clothing,  and athletic equipment.   Wash and dry your clothes and bedding at the warmest temperatures recommended on the labels.   Keep wounds covered. Pus from infected sores may contain MRSA and other bacteria. Keep cuts and abrasions clean and covered with germ-free (sterile), dry bandages until they are healed.   If you have a wound that appears infected, ask your caregiver if a culture for MRSA and other bacteria should be done.   If you are breastfeeding, talk to your caregiver about MRSA. You may be asked to temporarily stop breastfeeding.  HOME CARE INSTRUCTIONS   Take your antibiotics as directed. Finish them even if you start to feel better.   Avoid close contact with those around you as much as possible. Do not use towels, razors, toothbrushes, bedding, or other items that will be used by others.   To fight the infection, follow your caregiver's instructions for wound care. Wash your hands before and after changing your bandages.   If you have an intravascular device, such as a catheter, make sure you know how to care for it.   Be sure to tell any healthcare providers that you have MRSA so they are aware of your infection.  SEEK IMMEDIATE MEDICAL CARE IF:  The infection appears to be getting worse. Signs include:   Increased warmth, redness, or tenderness around the wound site.   A red line that extends from the infection site.   A dark color in the area around the infection.   Wound drainage that is tan, yellow, or green.   A bad smell coming from the wound.   You feel sick to your stomach (nauseous) and throw up (vomit) or cannot keep medicine down.   You have a fever.   Your baby is older than 3 months with a rectal temperature of 102 F (38.9 C) or higher.   Your baby is 80 months old or younger with a rectal temperature of 100.4 F (38 C) or higher.   You have difficulty breathing.  MAKE SURE YOU:   Understand these instructions.   Will watch your condition.    Will get help right away if you are not doing well or get worse.  Document Released: 03/13/2006 Document Revised: 11/27/2011 Document Reviewed: 03/13/2011 [employer] Patient Information 2012 [employer]eck your HIV and HGB A1C

## 2012-07-27 NOTE — Progress Notes (Signed)
Patient ID: Whitney Wagner, female   DOB: [birth year], 36 y.o.   MRN: [id number]  Subjective:   Patient ID: Josalin JARELYN Wagner female   DOB: Nov 23, 1976 36 y.o.   MRN: [id number]  HPI: Ms.Kristalynn L Kelsay is a 36 y.o. woman with PMH significant recurrent MRSA skin infections/cellulitis/abscesses hospitalized in 2007 with I+D and IV vancomycin and Zosyn tx. She was discharged with Bactrim DS. She reports intermittent mild outbreaks which has been treated with Bactrim DS oral ABX. Last treatment was in May, 2013. She says that this is usual for her to start with a boil than get the rash and she gets antibiotics Bactrim which works well for her. She has tried many antibacterial soap/bath/cream without effect in the past. She has followed with ID Dr. Herbie Baltimore in the past.     She noticed multiple (5-6) skin boils for 2 weeks. Her skin boils are red, tender without drainage, located under her arms and left flank area. The biggest one is measured 2x3 cm induration. No signes of cellulitis.    She is nondiabetic but is morbidly obese. She denies history of sexual transmitted disease. She agrees to check her HIV.  She denies any fever, chills, night sweats, recent weight loss, chest pain, shortness of breath, nausea, vomiting, headache or abdominal pain.   Review of Systems:  Constitutional:  Denies fever, chills, diaphoresis, appetite change and fatigue.   HEENT:  Denies congestion, sore throat, rhinorrhea, sneezing, mouth sores, trouble swallowing, neck pain   Respiratory:  Denies SOB, DOE, cough, and wheezing.   Cardiovascular:  Denies palpitations and leg swelling.   Gastrointestinal:  Denies nausea, vomiting, abdominal pain, diarrhea, constipation, blood in stool and abdominal distention.   Genitourinary:  Denies dysuria, urgency, frequency, hematuria, flank pain and difficulty urinating.   Musculoskeletal:  Denies myalgias, back pain, joint swelling, arthralgias and gait problem.   Skin:  Denies  pallor, rash and wound. Multiple small boils  Neurological:  Denies dizziness, seizures, syncope, weakness, light-headedness, numbness and headaches.    .       Past Medical History  Diagnosis Date  . PCOS (polycystic ovarian syndrome)    Current Outpatient Prescriptions  Medication Sig Dispense Refill  . traMADol (ULTRAM) 50 MG tablet Take 1-2 tablets (50-100 mg total) by mouth every 6 (six) hours as needed.  60 tablet  2   Family History  Problem Relation Age of Onset  . Diabetes Brother   . Hyperlipidemia Brother    History   Social History  . Marital Status: Single    Spouse Name: N/A    Number of Children: N/A  . Years of Education: N/A   Social History Main Topics  . Smoking status: Current Everyday Smoker -- 0.5 packs/day    Types: Cigarettes  . Smokeless tobacco: None  . Alcohol Use: No  . Drug Use: No  . Sexually Active: None   Other Topics Concern  . None   Social History Narrative  . None   Review of Systems: See HPI  Objective:  Physical Exam: Filed Vitals:   07/27/12 1029 07/27/12 1031  BP:  110/78  Pulse: 82   Temp: 98.5 F (36.9 C)   TempSrc: Oral   Height: 5\' 2"  (1.575 m)   Weight: 259 lb 1.6 oz (117.527 kg)   SpO2:  96%   General: alert, well-developed, and cooperative to examination.  Head: normocephalic and atraumatic.  Eyes: vision grossly intact, pupils equal, pupils round, pupils reactive to light, no injection  and anicteric.  Mouth: pharynx pink and moist, no erythema, and no exudates.  Neck: supple, full ROM, no thyromegaly, no JVD, and no carotid bruits.  Lungs: normal respiratory effort, no accessory muscle use, normal breath sounds, no crackles, and no wheezes. Heart: normal rate, regular rhythm, no murmur, no gallop, and no rub.  Abdomen: soft, non-tender, normal bowel sounds, no distention, no guarding, no rebound tenderness, no hepatomegaly, and no splenomegaly.  Msk: no joint swelling, no joint warmth, and no redness  over joints.  Pulses: 2+ DP/PT pulses bilaterally Extremities: No cyanosis, clubbing, edema Neurologic: alert & oriented X3, cranial nerves II-XII intact, strength normal in all extremities, sensation intact to light touch, and gait normal.  Skin: turgor normal. Multiple skin boils noted under her arm and left flank area. Erythema,Tender to touch. No warm to touch or drainage noted. The biggest one measured with 2x3 cm induration. No Fluctuance noted on all of her boils. Psych: Oriented X3, memory intact for recent and remote, normally interactive, good eye contact, not anxious appearing, and not depressed appearing.   Assessment & Plan:

## 2012-07-27 NOTE — Assessment & Plan Note (Addendum)
Multiple skin boils noted without any furtherance. Erythma, tender to touch. No warmth or drainage.  - patient declines any skin cream, bath, or lotion since " They did not work for me."  - Bactrim DS 800/160 1 Tab po BID x 10 days - patient is instructed to follow up as needed.

## 2013-04-28 ENCOUNTER — Inpatient Hospital Stay (HOSPITAL_COMMUNITY): Payer: Self-pay

## 2013-04-28 ENCOUNTER — Encounter (HOSPITAL_COMMUNITY): Payer: Self-pay | Admitting: Family

## 2013-04-28 ENCOUNTER — Inpatient Hospital Stay (HOSPITAL_COMMUNITY)
Admission: AD | Admit: 2013-04-28 | Discharge: 2013-04-28 | Disposition: A | Discharge status: Hospice | Payer: Self-pay | Source: Ambulatory Visit | Attending: Obstetrics & Gynecology | Admitting: Obstetrics & Gynecology

## 2013-04-28 DIAGNOSIS — D219 Benign neoplasm of connective and other soft tissue, unspecified: Secondary | ICD-10-CM

## 2013-04-28 DIAGNOSIS — R109 Unspecified abdominal pain: Secondary | ICD-10-CM | POA: Insufficient documentation

## 2013-04-28 DIAGNOSIS — D259 Leiomyoma of uterus, unspecified: Secondary | ICD-10-CM | POA: Insufficient documentation

## 2013-04-28 LAB — URINALYSIS, ROUTINE W REFLEX MICROSCOPIC
Bilirubin Urine: NEGATIVE
Glucose, UA: NEGATIVE mg/dL
Hgb urine dipstick: NEGATIVE
Ketones, ur: NEGATIVE mg/dL
Leukocytes, UA: NEGATIVE
pH: 6 (ref 5.0–8.0)

## 2013-04-28 LAB — POCT PREGNANCY, URINE: Preg Test, Ur: NEGATIVE

## 2013-04-28 MED ORDER — HYDROMORPHONE HCL PF 1 MG/ML IJ SOLN
1.0000 mg | Freq: Once | INTRAMUSCULAR | Status: AC
  Administered 2013-04-28: 1 mg via INTRAMUSCULAR
  Filled 2013-04-28: qty 1

## 2013-04-28 MED ORDER — OXYCODONE-ACETAMINOPHEN 5-325 MG PO TABS: 2.0000 | ORAL_TABLET | ORAL | Status: DC | PRN

## 2013-04-28 NOTE — MAU Note (Signed)
Severe abd pain, hurts all the way through to back.  Started a couple days ago.  Was told has fibroids.  Has been waiting on Obama care stuff to come through, pain has gotten worse and just can't wait.

## 2013-04-28 NOTE — MAU Note (Signed)
Patient presents to MAU with c/o constant lower abdominal pain radiating to back since yesterday 1400. Reports hx of uterine fibroids. LMP 04/18/13.

## 2013-04-28 NOTE — MAU Provider Note (Signed)
History     CSN: [id number]  Arrival date and time: 04/28/13 1240   First Provider Initiated Contact with Patient 04/28/13 1438      Chief Complaint  Patient presents with  . Abdominal Pain   HPI Whitney Wagner is a 37 y.o. female who presents to MAU today with complaint of severe lower abdominal pain since mid-day yesterday. She states that the pain is getting worse. It is all across her lower abdomen and radiating into her lower back. She has known fibroids, but has been waiting for Obama care to be approved to see a Careers adviser at [facility] clinic. She denies vaginal bleeding or discharge. She has some nausea without vomiting. She has diarrhea, but this is common and chronic for her because of previously diagnosed IBS. She denies fever. She has pain with urination and BM that she describes as lower pelvic pressure.    OB History   Grav Para Term Preterm Abortions TAB SAB Ect Mult Living                  Past Medical History  Diagnosis Date  . PCOS (polycystic ovarian syndrome)     Past Surgical History  Procedure Laterality Date  . External fixation arm    . Knee surgery      Family History  Problem Relation Age of Onset  . Diabetes Brother   . Hyperlipidemia Brother     History  Substance Use Topics  . Smoking status: Current Every Day Smoker -- 0.50 packs/day    Types: Cigarettes  . Smokeless tobacco: Not on file  . Alcohol Use: No    Allergies:  Allergies  Allergen Reactions  . Codeine Hives    No prescriptions prior to admission    Review of Systems  Constitutional: Negative for fever, chills and malaise/fatigue.  Gastrointestinal: Positive for nausea, abdominal pain and diarrhea. Negative for vomiting and constipation.  Genitourinary: Positive for dysuria. Negative for urgency and frequency.       Neg - vaginal bleeding, discharge   Physical Exam   Blood pressure 120/81, pulse 84, temperature 98.4 F (36.9 C), temperature source Oral, resp. rate  18, height 5\' 2"  (1.575 m), weight 260 lb (117.935 kg), last menstrual period 04/18/2013.  Physical Exam  Constitutional: She is oriented to person, place, and time. She appears well-developed and well-nourished. No distress.  HENT:  Head: Normocephalic and atraumatic.  Cardiovascular: Normal rate, regular rhythm and normal heart sounds.   Respiratory: Effort normal and breath sounds normal. No respiratory distress.  GI: Soft. Bowel sounds are normal. She exhibits no distension and no mass. There is tenderness (moderate tenderness to palpation of the lower abdomen). There is no rebound and no guarding.  Neurological: She is alert and oriented to person, place, and time.  Skin: Skin is warm and dry. No erythema.  Psychiatric: She has a normal mood and affect.   Results for orders placed during the hospital encounter of 04/28/13 (from the past 24 hour(s))  URINALYSIS, ROUTINE W REFLEX MICROSCOPIC     Status: None   Collection Time    04/28/13  1:19 PM      Result Value Range   Color, Urine YELLOW  YELLOW   APPearance CLEAR  CLEAR   Specific Gravity, Urine 1.015  1.005 - 1.030   pH 6.0  5.0 - 8.0   Glucose, UA NEGATIVE  NEGATIVE mg/dL   Hgb urine dipstick NEGATIVE  NEGATIVE   Bilirubin Urine NEGATIVE  NEGATIVE  Ketones, ur NEGATIVE  NEGATIVE mg/dL   Protein, ur NEGATIVE  NEGATIVE mg/dL   Urobilinogen, UA 0.2  0.0 - 1.0 mg/dL   Nitrite NEGATIVE  NEGATIVE   Leukocytes, UA NEGATIVE  NEGATIVE  POCT PREGNANCY, URINE     Status: None   Collection Time    04/28/13  1:26 PM      Result Value Range   Preg Test, Ur NEGATIVE  NEGATIVE   US Transvaginal Non-ob  04/28/2013  *RADIOLOGY REPORT*  Clinical Data: Lower abdominal pain.  History of fibroids.  TRANSABDOMINAL AND TRANSVAGINAL ULTRASOUND OF PELVIS Technique:  Both transabdominal and transvaginal ultrasound examinations of the pelvis were performed. Transabdominal technique was performed for global imaging of the pelvis including uterus,  ovaries, adnexal regions, and pelvic cul-de-sac.  It was necessary to proceed with endovaginal exam following the transabdominal exam to visualize the uterus, endometrium, ovaries, and adnexa.  Comparison:  Ultrasound dated 11/16/2007  Findings:  Uterus: 11.1 x 6.3 x 7.2 cm.  There is a 5.6 94.6 x 5.6 cm fibroid in the uterine fundus.  There is also a 6.0 x 5.3 x 5.7 cm fibroid in the lower uterine segment.  Endometrium: Normal.  11.1 cm in thickness.  Right ovary:  Normal.  4.1 x 2.2 x 2.4 cm.  Left ovary: Normal.  2.7 x 2.4 x 1.8 cm.  Other findings: Trace free fluid.  IMPRESSION:  The patient now has two large fibroids in the uterus, a marked change since the prior exam.   Original Report Authenticated By: Francene Boyers, M.D.    US Pelvis Complete  04/28/2013  *RADIOLOGY REPORT*  Clinical Data: Lower abdominal pain.  History of fibroids.  TRANSABDOMINAL AND TRANSVAGINAL ULTRASOUND OF PELVIS Technique:  Both transabdominal and transvaginal ultrasound examinations of the pelvis were performed. Transabdominal technique was performed for global imaging of the pelvis including uterus, ovaries, adnexal regions, and pelvic cul-de-sac.  It was necessary to proceed with endovaginal exam following the transabdominal exam to visualize the uterus, endometrium, ovaries, and adnexa.  Comparison:  Ultrasound dated 11/16/2007  Findings:  Uterus: 11.1 x 6.3 x 7.2 cm.  There is a 5.6 94.6 x 5.6 cm fibroid in the uterine fundus.  There is also a 6.0 x 5.3 x 5.7 cm fibroid in the lower uterine segment.  Endometrium: Normal.  11.1 cm in thickness.  Right ovary:  Normal.  4.1 x 2.2 x 2.4 cm.  Left ovary: Normal.  2.7 x 2.4 x 1.8 cm.  Other findings: Trace free fluid.  IMPRESSION:  The patient now has two large fibroids in the uterus, a marked change since the prior exam.   Original Report Authenticated By: Francene Boyers, M.D.     MAU Course  Procedures None  MDM UA, [place] today 1 mg IM dilaudid given in MAU - some relief of  discomfort Assessment and Plan  A: Uterine fibroids  P: Discharge home Rx for Percocet given to patient. Encouraged Ibuprofen for breakthrough pain Referral to [facility] clinic for follow-up with MD Patient may return to MAU as needed or if her condition were to change or worsen  Freddi Starr, PA-C  04/28/2013, 6:16 PM

## 2013-05-02 NOTE — MAU Provider Note (Signed)
Attestation of Attending Supervision of Advanced Practitioner (CNM/NP): Evaluation and management procedures were performed by the Advanced Practitioner under my supervision and collaboration.  I have reviewed the Advanced Practitioner's note and chart, and I agree with the management and plan.  HARRAWAY-SMITH, Mayanna Garlitz 10:56 AM

## 2013-05-23 ENCOUNTER — Ambulatory Visit (INDEPENDENT_AMBULATORY_CARE_PROVIDER_SITE_OTHER): Payer: BC Managed Care – PPO | Admitting: Obstetrics & Gynecology

## 2013-05-23 ENCOUNTER — Encounter: Payer: Self-pay | Admitting: Obstetrics & Gynecology

## 2013-05-23 VITALS — BP 124/88 | HR 93 | Temp 98.9°F | Ht 62.0 in | Wt 266.2 lb

## 2013-05-23 DIAGNOSIS — N949 Unspecified condition associated with female genital organs and menstrual cycle: Secondary | ICD-10-CM

## 2013-05-23 DIAGNOSIS — D219 Benign neoplasm of connective and other soft tissue, unspecified: Secondary | ICD-10-CM

## 2013-05-23 DIAGNOSIS — D259 Leiomyoma of uterus, unspecified: Secondary | ICD-10-CM

## 2013-05-23 DIAGNOSIS — R102 Pelvic and perineal pain: Secondary | ICD-10-CM

## 2013-05-23 DIAGNOSIS — Z01419 Encounter for gynecological examination (general) (routine) without abnormal findings: Secondary | ICD-10-CM

## 2013-05-23 MED ORDER — TRAMADOL HCL 50 MG PO TABS: 50.0000 mg | ORAL_TABLET | Freq: Four times a day (QID) | ORAL | Status: DC | PRN

## 2013-05-23 NOTE — Progress Notes (Signed)
Subjective:     Patient ID: Whitney Wagner, female   DOB: [birth year], 37 y.o.   MRN: [id number]  HPI Pt is a G0 with h/o heavy bleeding and severely painful menses. PAP 2009.  No h/o mammogram. Patient denies bleeding between cycles. She wants definitive treatment with surgery.  Pt reports that she is unable to tolerate OCP's.      Past Medical History  Diagnosis Date  . PCOS (polycystic ovarian syndrome)   . Fibroids    Past Surgical History  Procedure Laterality Date  . External fixation arm    . Knee surgery     Current Outpatient Prescriptions on File Prior to Visit  Medication Sig Dispense Refill  . cetirizine (ZYRTEC) 10 MG tablet Take 10 mg by mouth daily as needed for allergies.      Marland Kitchen ibuprofen (ADVIL,MOTRIN) 200 MG tablet Take 600-800 mg by mouth every 6 (six) hours as needed for pain.      Marland Kitchen oxyCODONE-acetaminophen (PERCOCET/ROXICET) 5-325 MG per tablet Take 2 tablets by mouth every 4 (four) hours as needed for pain.  15 tablet  0   No current facility-administered medications on file prior to visit.   Allergies  Allergen Reactions  . Codeine Hives   History   Social History  . Marital Status: Single    Spouse Name: N/A    Number of Children: N/A  . Years of Education: N/A   Occupational History  . Not on file.   Social History Main Topics  . Smoking status: Current Every Day Smoker -- 0.50 packs/day    Types: Cigarettes  . Smokeless tobacco: Never Used  . Alcohol Use: No  . Drug Use: No  . Sexually Active: Not on file   Other Topics Concern  . Not on file   Social History Narrative  . No narrative on file   Family History  Problem Relation Age of Onset  . Diabetes Brother   . Hyperlipidemia Brother   . Fibromyalgia Mother   . Cancer Maternal Aunt     breast cancer         Review of Systems     Objective:   Physical Exam BP 124/88  Pulse 93  Temp(Src) 98.9 F (37.2 C) (Oral)  Ht 5\' 2"  (1.575 m)  Wt 266 lb 3.2 oz (120.748 kg)   BMI 48.68 kg/m2  LMP 05/08/2013 PT in NAD Lungs: CTA CV: RRR Abd: soft, NT, ND, obese GU: EGBUS: no lesions Vagina: no blood in vault Cervix: no lesion; no mucopurulent d/c Uterus: palpable uterine mass; difficult to appreciate due to body habitus Adnexa: no masses; non tender       04/28/2013 sono Comparison: Ultrasound dated 11/16/2007  Findings:  Uterus: 11.1 x 6.3 x 7.2 cm. There is a 5.6 94.6 x 5.6 cm fibroid  in the uterine fundus. There is also a 6.0 x 5.3 x 5.7 cm fibroid  in the lower uterine segment.  Endometrium: Normal. 11.1 cm in thickness.  Right ovary: Normal. 4.1 x 2.2 x 2.4 cm.  Left ovary: Normal. 2.7 x 2.4 x 1.8 cm.  Other findings: Trace free fluid.  IMPRESSION:  The patient now has two large fibroids in the uterus, a marked  change since the prior exam.      Assessment:     Pelvic pain, symptomatic uterine fibroids most likely the cause of her menorrhagia pt wants definitve treatment with surgery.  I have informed the patient that I will not be able to  operate on her until August but, she wants robotic surgery and want to wait until that time.     Plan:     Schedule Robot assisted Hyst F/u PAP Ultram prn pain #60 F/u prior vist to discuss surgery in detail prior to hyst

## 2013-05-23 NOTE — Patient Instructions (Signed)
Uterine Fibroid A uterine fibroid is a growth (tumor) that occurs in a woman's uterus. This type of tumor is not cancerous and does not spread out of the uterus. A woman can have one or many fibroids, and the fiboid(s) can become quite large. A fibroid can vary in size, weight, and where it grows in the uterus. Most fibroids do not require medical treatment, but some can cause pain or heavy bleeding during and between periods. CAUSES  A fibroid is the result of a single uterine cell that keeps growing (unregulated), which is different than most cells in the human body. Most cells have a control mechanism that keeps them from reproducing without control.  SYMPTOMS   Bleeding.  Pelvic pain and pressure.  Bladder problems due to the size of the fibroid.  Infertility and miscarriages depending on the size and location of the fibroid. DIAGNOSIS  A diagnosis is made by physical exam. Your caregiver may feel the lumpy tumors during a pelvic exam. Important information regarding size, location, and number of tumors can be gained by having an ultrasound. It is rare that other tests, such as a CT scan or MRI, are needed. TREATMENT   Your caregiver may recommend watchful waiting. This involves getting the fibroid checked by your caregiver to see if the fibroids grow or shrink.   Hormonal treatment or an intrauterine device (IUD) may be prescribed.   Surgery may be needed to remove the fibroids (myomectomy) or the uterus (hysterectomy). This depends on your situation. When fibroids interfere with fertility and a woman wants to become pregnant, a caregiver may recommend having the fibroids removed.  HOME CARE INSTRUCTIONS  Home care depends on how you were treated. In general:   Keep all follow-up appointments with your caregiver.   Only take medicine as told by your caregiver. Do not take aspirin. It can cause bleeding.   If you have excessive periods and soak tampons or pads in a half hour or  less, contact your caregiver immediately. If your periods are troublesome but not so heavy, lie down with your feet raised slightly above your heart. Place cold packs on your lower abdomen.   If your periods are heavy, write down the number of pads or tampons you use per month. Bring this information to your caregiver.   Talk to your caregiver about taking iron pills.   Include green vegetables in your diet.   If you were prescribed a hormonal treatment, take the hormonal medicines as directed.   If you need surgery, ask your caregiver for information on your specific surgery.  SEEK IMMEDIATE MEDICAL CARE IF:  You have pelvic pain or cramps not controlled with medicines.   You have a sudden increase in pelvic pain.   You have an increase of bleeding between and during periods.   You feel lightheaded or have fainting episodes.  MAKE SURE YOU:  Understand these instructions.  Will watch your condition.  Will get help right away if you are not doing well or get worse. Document Released: 12/05/2000 Document Revised: 03/01/2012 Document Reviewed: 12/29/2011 [employer] Patient Information 2014 [employer].  

## 2013-05-24 ENCOUNTER — Encounter: Payer: Self-pay | Admitting: Obstetrics & Gynecology

## 2013-05-26 ENCOUNTER — Inpatient Hospital Stay (HOSPITAL_COMMUNITY): Payer: BC Managed Care – PPO

## 2013-05-26 ENCOUNTER — Observation Stay (HOSPITAL_COMMUNITY)
Admission: AD | Admit: 2013-05-26 | Discharge: 2013-05-27 | Disposition: A | Discharge status: Home / Self Care | Payer: BC Managed Care – PPO | Source: Ambulatory Visit | Attending: Internal Medicine | Admitting: Internal Medicine

## 2013-05-26 ENCOUNTER — Encounter (HOSPITAL_COMMUNITY): Payer: Self-pay | Admitting: General Practice

## 2013-05-26 ENCOUNTER — Ambulatory Visit (INDEPENDENT_AMBULATORY_CARE_PROVIDER_SITE_OTHER): Payer: BC Managed Care – PPO | Admitting: Internal Medicine

## 2013-05-26 VITALS — BP 127/84 | HR 96 | Temp 97.8°F | Ht 62.0 in | Wt 266.2 lb

## 2013-05-26 DIAGNOSIS — R51 Headache: Secondary | ICD-10-CM

## 2013-05-26 DIAGNOSIS — Z720 Tobacco use: Secondary | ICD-10-CM

## 2013-05-26 DIAGNOSIS — Z8614 Personal history of Methicillin resistant Staphylococcus aureus infection: Secondary | ICD-10-CM

## 2013-05-26 DIAGNOSIS — K089 Disorder of teeth and supporting structures, unspecified: Secondary | ICD-10-CM

## 2013-05-26 DIAGNOSIS — K0889 Other specified disorders of teeth and supporting structures: Secondary | ICD-10-CM

## 2013-05-26 DIAGNOSIS — F17200 Nicotine dependence, unspecified, uncomplicated: Secondary | ICD-10-CM

## 2013-05-26 DIAGNOSIS — K029 Dental caries, unspecified: Secondary | ICD-10-CM | POA: Insufficient documentation

## 2013-05-26 DIAGNOSIS — R6884 Jaw pain: Principal | ICD-10-CM | POA: Diagnosis present

## 2013-05-26 HISTORY — DX: Other specified disorders of teeth and supporting structures: K08.89

## 2013-05-26 HISTORY — DX: Leiomyoma of uterus, unspecified: D25.9

## 2013-05-26 HISTORY — DX: Juvenile osteochondrosis of tibia tubercle, left leg: M92.522

## 2013-05-26 HISTORY — DX: Unspecified osteoarthritis, unspecified site: M19.90

## 2013-05-26 HISTORY — DX: Cluster headache syndrome, unspecified, not intractable: G44.009

## 2013-05-26 HISTORY — DX: Juvenile osteochondrosis of tibia and fibula, left leg: M92.52

## 2013-05-26 HISTORY — DX: Gastro-esophageal reflux disease without esophagitis: K21.9

## 2013-05-26 LAB — CBC WITH DIFFERENTIAL/PLATELET
Eosinophils Absolute: 0.3 10*3/uL (ref 0.0–0.7)
Hemoglobin: 14.3 g/dL (ref 12.0–15.0)
Lymphs Abs: 2.7 10*3/uL (ref 0.7–4.0)
MCH: 29.7 pg (ref 26.0–34.0)
Monocytes Relative: 6 % (ref 3–12)
Neutro Abs: 5.6 10*3/uL (ref 1.7–7.7)
Neutrophils Relative %: 60 % (ref 43–77)
Platelets: 244 10*3/uL (ref 150–400)
RBC: 4.81 MIL/uL (ref 3.87–5.11)
WBC: 9.2 10*3/uL (ref 4.0–10.5)

## 2013-05-26 LAB — BASIC METABOLIC PANEL
BUN: 10 mg/dL (ref 6–23)
Chloride: 101 mEq/L (ref 96–112)
GFR calc Af Amer: >90 mL/min (ref >90)
Potassium: 3.4 mEq/L — ABNORMAL LOW (ref 3.5–5.1)

## 2013-05-26 LAB — APTT: aPTT: 26 seconds (ref 24–37)

## 2013-05-26 MED ORDER — NICOTINE 21 MG/24HR TD PT24
21.0000 mg | MEDICATED_PATCH | Freq: Every day | TRANSDERMAL | Status: DC
  Administered 2013-05-26 – 2013-05-27 (×2): 21 mg via TRANSDERMAL
  Filled 2013-05-26 (×3): qty 1

## 2013-05-26 MED ORDER — HEPARIN SODIUM (PORCINE) 5000 UNIT/ML IJ SOLN
5000.0000 [IU] | Freq: Three times a day (TID) | INTRAMUSCULAR | Status: DC
  Administered 2013-05-26: 5000 [IU] via SUBCUTANEOUS
  Filled 2013-05-26 (×3): qty 1

## 2013-05-26 MED ORDER — SODIUM CHLORIDE 0.9 % IJ SOLN: 3.0000 mL | INTRAMUSCULAR | Status: DC | PRN

## 2013-05-26 MED ORDER — SODIUM CHLORIDE 0.9 % IV SOLN: 250.0000 mL | INTRAVENOUS | Status: DC | PRN

## 2013-05-26 MED ORDER — SODIUM CHLORIDE 0.9 % IJ SOLN
3.0000 mL | Freq: Two times a day (BID) | INTRAMUSCULAR | Status: DC
  Administered 2013-05-26: 3 mL via INTRAVENOUS

## 2013-05-26 MED ORDER — HYDROMORPHONE HCL PF 1 MG/ML IJ SOLN
1.0000 mg | INTRAMUSCULAR | Status: DC | PRN
  Administered 2013-05-26 – 2013-05-27 (×2): 1 mg via INTRAVENOUS
  Filled 2013-05-26 (×3): qty 1

## 2013-05-26 NOTE — H&P (Signed)
Hospital Admission Note Date: 05/26/2013  Patient name: Whitney Wagner Medical record number: [id number] Date of birth: 1976-05-24 Age: 37 y.o. Gender: female PCP: Almyra Deforest, MD  Medical Service: IMTS  Attending physician: Dr. Governor Specking Hours (7AM-5PM):  1st Contact: Dr. Elenor Legato Pager: [phone] 2nd Contact: Dr. Suszanne Conners Pager: [phone]  ** If no return call within 15 minutes (after trying both pagers listed below), please call after hours pagers.  After 5 pm or weekends: 1st Contact: Pager: [phone] 2nd Contact: Pager: [phone]   Chief Complaint: Jaw and tooth pain on the right side.  History of Present Illness:  Whitney Wagner is a 37 y.o. female with a history of tobacco abuse and poor dentition requiring multiple extractions (4 teeth and 2 wisdom teeth), who presents with right sided jaw and tooth pain.   Patient reports that she started having right jaw pain at 3 days ago. She describes having a "drilling" sensation in her right jaw that began 3 days ago and has been progressively getting worse. It is radiating to the ear at the same side. There is no ear discharge. The pain is currently a 10/10 pain, unalleviated by hydrocodone or clove oil. Other oral treatments do not help the pain. In fact, patient notes that a tooth brush bristle which "went inside" a cavity in a right molar sent a shooting pain through the same distribution as her current pain. Pain is made better only by a Administrator, sports. Pain is made worse by talking, eating, smoking, and lying down. She reports new onset tinnitus without an apparent decrease in ability to hear. Patient has loss of appetite secondary to difficulty with chewing. She has headaches at baseline which are currently no worse than usually. She acknowledges some right sided neck and shoulder pains. 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   Patient does not have history of diabetes mellitus. She denies fever, chills, headaches,  cough, chest pain, SOB,  abdominal pain,diarrhea, constipation, dysuria, urgency, frequency, hematuria.   Meds:  Medications Prior to Admission  Medication Sig Dispense Refill  . traMADol (ULTRAM) 50 MG tablet Take 1 tablet (50 mg total) by mouth every 6 (six) hours as needed for pain.  60 tablet  0   Allergies: Allergies as of 05/26/2013 - Review Complete 05/26/2013  Allergen Reaction Noted  . Codeine Hives 02/15/2011   Past Medical History  Diagnosis Date  . PCOS (polycystic ovarian syndrome)   . Fibroids    Past Surgical History  Procedure Laterality Date  . External fixation arm    . Knee surgery     Family History  Problem Relation Age of Onset  . Diabetes Brother   . Hyperlipidemia Brother   . Fibromyalgia Mother   . Cancer Maternal Aunt     breast cancer   History   Social History  . Marital Status: Single    Spouse Name: N/A    Number of Children: N/A  . Years of Education: N/A   Occupational History  . Not on file.   Social History Main Topics  . Smoking status: Current Every Day Smoker -- 0.50 packs/day    Types: Cigarettes  . Smokeless tobacco: Never Used  . Alcohol Use: No  . Drug Use: No  . Sexually Active: Not on file  Other Topics Concern  . Not on file   Social History Narrative  . No narrative on file    Review of Systems: Full 14-point review of systems otherwise negative except as noted above in HPI.  Physical Exam: Filed Vitals:     05/26/13 1525   BP:  127/84   Pulse:  96   Temp:  97.8 F (36.6 C)   TempSrc:  Oral   Height:  5\' 2"  (1.575 m)   Weight:  266 lb 3.2 oz (120.748 kg)   SpO2:  98%    General: Not in acute distress, but in obvious pain  HEENT: Normocephalic, without obvious abnormality, atraumatic, normal TM's and external ear canals  both ears, moist mucus membranes. There are multiple dental caries present, missing 4 teeth. no neck adenopathy. There is tenderness to palpation along the right jaw and at the right TMJ. No warmth locally. There is no obvious swelling or abscess over the right jaw.   Cardiac: S1/S2, RRR, No murmurs, gallops or rubs Pulm: Good air movement bilaterally. Clear to auscultation bilaterally. No rales, wheezing, rhonchi or rubs. Abd: Soft, nondistended, nontender, no rebound pain, no organomegaly, BS present Ext: No edema. 2+DP/PT pulse bilaterally Musculoskeletal: No joint deformities, erythema, or stiffness, ROM full Skin: No rashes.  Neuro: Alert and oriented X3, cranial nerves II-XII grossly intact, muscle strength 5/5 in all extremeties, sensation to light touch intact. Brachial reflex 2+ bilaterally. Knee reflex 1+ bilaterally. Psych: Patient is not psychotic, no suicidal or hemocidal ideation.  Lab results: Basic Metabolic Panel: No results found for this basename: NA, K, CL, CO2, GLUCOSE, BUN, CREATININE, CALCIUM, MG, PHOS,  in the last 72 hours Liver Function Tests: No results found for this basename: AST, ALT, ALKPHOS, BILITOT, PROT, ALBUMIN,  in the last 72 hours No results found for this basename: LIPASE, AMYLASE,  in the last 72 hours No results found for this basename: AMMONIA,  in the last 72 hours CBC: No results found for this basename: WBC, NEUTROABS, HGB, HCT, MCV, PLT,  in the last 72 hours Cardiac Enzymes: No results found for this basename: CKTOTAL, CKMB, CKMBINDEX, TROPONINI,  in the last 72 hours BNP: No results found for this basename: PROBNP,  in the last 72 hours D-Dimer: No results found for this basename: DDIMER,  in the last 72 hours CBG: No results found for this basename: GLUCAP,  in the last 72 hours Hemoglobin A1C: No results found for this basename: HGBA1C,  in the last 72 hours Fasting Lipid Panel: No results found for this basename: CHOL, HDL, LDLCALC,  TRIG, CHOLHDL, LDLDIRECT,  in the last 72 hours Thyroid Function Tests: No results found for this basename: TSH, T4TOTAL, FREET4, T3FREE, THYROIDAB,  in the last 72 hours Anemia Panel: No results found for this basename: VITAMINB12, FOLATE, FERRITIN, TIBC, IRON, RETICCTPCT,  in the last 72 hours Coagulation: No results found for this basename: LABPROT, INR,  in the last 72 hours Urine Drug Screen: Drugs of Abuse  No results found for this basename: labopia,  cocainscrnur,  labbenz,  amphetmu,  thcu,  labbarb    Alcohol Level: No results found for this basename: ETH,  in the last 72 hours Urinalysis: No results found for this basename: COLORURINE, APPERANCEUR, LABSPEC, PHURINE, GLUCOSEU, HGBUR, BILIRUBINUR, KETONESUR, PROTEINUR, UROBILINOGEN, NITRITE, LEUKOCYTESUR,  in the last 72 hours Misc. Labs:  Imaging results:  No results found.  Other results:  EKG:   Assessment & Plan by Problem: Principal Problem:   Jaw  pain Active Problems:   Tobacco abuse  Patient is a 37 year old lady with past medical history of tobacco abuse and poor dentition, who presents with a right side jaw pain for 3 days. Since this is direct admission from clinic,  No lab results are available yet.  #: Jaw and tooth pain: Patient failed clinic management for severe pain with oral opioid. The pt has BCBS but it doesn't provide dental coverage. She is not eligible for the dental clinic as she doesn't have the orange card. Currently patient does not have fever or chills. No obvious signs for infection. No signs of abscess formation. CBC is pending. Patient does not have a history of diabetes. No indication for antibiotics at this time.   Plan;  - Admit Med-Surg bed - pain control with IV dilaudid - Obtain Pan-Orex, - Obtain oral surgery consult in the AM. Since There is no oral surgeon on call today, we did not call for consult.  - Pending BMP, CBC, INR and PTT - NPO after midnight.  #: Tobacco abuse:  Patient smoked 0.5-1 pack a day at home. -Nicotine patch -SW consult  #  F/E/N  -NSL -Monitor electrolytes by checking BMP -NPO after MN  # DVT px: Heparin sq  Dispo: Disposition is deferred at this time, awaiting improvement of current medical problems. Anticipated discharge in approximately 3 day(s).   The patient does have a current PCP Almyra Deforest, MD), therefore is requiring [facility]PC follow-up after discharge.   The patient does not have transportation limitations that hinder transportation to clinic appointments.  Signed:  Lorretta Harp, MD PGY2, Internal Medicine Teaching Service Pager: [phone]  05/26/2013, 6:53 PM

## 2013-05-26 NOTE — Progress Notes (Signed)
Subjective:   Patient ID: Whitney Wagner female   DOB: [birth year] 37 y.o.   MRN: [id number]  HPI: [name] is a 37 y.o. female without sig PMHx. She had R molars [facility] a few years ago [facility]. About 6 months ago, she had L molars removed at a dentist's office. Her employer paid for that visit. At that time, an Xray was obtained and she states that fragments from the R molars were present. Pt has had no dental pain for about 6 months ago until 2-34 days ago when she developed pain assoc with the L lower most posterior tooth. The tooth doesn't hurt when touched but is causing pain in her jaw, check, TMJ, and R ear.  When her toothbrush bristle went down into a "hole" in the tooth it caused excruciating pain. Eating also makes the pain worse. The only thing that provides relief is a hand held vibrator applied to the cheek area. She has hydrocodone 5/325 and took up to 2 at a time and it didn't touch the pain. She has not had a fever nor noticed any swelling. She has chronic HA but they are not worse. She has no allergy sxs. Her swallowing is not affected. She has tried clove oil without relief.   She is also noticing R shoulder pain and R arm weakness. 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 A few days prior to the tooth pain, she used that arm more to stuff cushions and her arm pain and weakness has been progressing.   Past Medical History  Diagnosis Date  . PCOS (polycystic ovarian syndrome)   . Fibroids    Current Outpatient Prescriptions  Medication Sig Dispense Refill  . cetirizine (ZYRTEC) 10 MG tablet Take 10 mg by mouth daily as needed for allergies.      Marland Kitchen ibuprofen (ADVIL,MOTRIN) 200 MG tablet Take 600-800 mg by mouth every 6 (six) hours as needed for pain.      Marland Kitchen oxyCODONE-acetaminophen (PERCOCET/ROXICET) 5-325 MG per tablet Take 2 tablets by mouth every 4 (four) hours as needed for pain.  15 tablet  0  . traMADol (ULTRAM) 50 MG  tablet Take 1 tablet (50 mg total) by mouth every 6 (six) hours as needed for pain.  60 tablet  0   No current facility-administered medications for this visit.   Family History  Problem Relation Age of Onset  . Diabetes Brother   . Hyperlipidemia Brother   . Fibromyalgia Mother   . Cancer Maternal Aunt     breast cancer   History   Social History  . Marital Status: Single    Spouse Name: N/A    Number of Children: N/A  . Years of Education: N/A   Social History Main Topics  . Smoking status: Current Every Day Smoker -- 0.50 packs/day    Types: Cigarettes  . Smokeless tobacco: Never Used  . Alcohol Use: No  . Drug Use: No  . Sexually Active: None   Other Topics Concern  . None   Social History Narrative  . None    Objective:  Physical Exam: Filed Vitals:   05/26/13 1525  BP: 127/84  Pulse: 96  Temp: 97.8 F (36.6 C)  TempSrc: Oral  Height: 5\' 2"  (1.575 m)  Weight: 266 lb 3.2 oz (120.748 kg)  SpO2: 98%   General appearance: alert, cooperative and mild distress Head: Normocephalic, without obvious abnormality, atraumatic Eyes: Sclera clear, EOMI  Ears: Nl TM on R, no pain with manipulation of R pinna Nose: Nares normal. Septum midline. Mucosa normal. No drainage or sinus tenderness. Throat: normal findings: lips normal without lesions, buccal mucosa normal, palate normal, tongue midline and normal and oropharynx pink & moist without lesions or evidence of thrush and abnormal findings: dentition: poor Neck: no adenopathy and supple, symmetrical, trachea midline Lungs: clear to auscultation bilaterally Heart: regular rate and rhythm, S1, S2 normal, no murmur, click, rub or gallop Abdomen: + BS Extremities: extremities normal, atraumatic, no cyanosis or edema Skin: Skin color, texture, turgor normal. No rashes or lesions Lymph nodes: Cervical, supraclavicular, and axillary nodes normal. and No cervical, post auricular, mandibular LAD Neurologic: Grossly normal  except 4/5 strength all RUE muscles  Assessment & Plan:   1. Tooth pain - Ideally, we would manage this as an outpt. The pt has BCBS but is doesn't provide dental coverage. She is not eligible for the dental clinic as she doesn't have the orange card. She has tried to manage the pain with oral opioids and has failed. Therefore, admit, obtain panorex, and obtain oral surgery consult in the AM. ABX are not required as there is no evidence of an infxn - no abscess on exam, no fever, no overt swelling.  NPO after midnight just in case. She has never required ABX prior to dental surgery. She has no known cardiac or pul issues although she does smoke  PPD.   2. Tobacco use - can cont to counsel cessation as an outpt.

## 2013-05-26 NOTE — H&P (Signed)
Date: 05/26/2013  Patient name: Whitney Wagner  Medical record number: [id number]  Date of birth: [birth year]   I have seen and evaluated Whitney Wagner Shingles and discussed their care with the Residency Team. [name] is a 37 y.o. female without sig PMHx. She had R molars [facility]. About 6 months ago, she had L molars removed at a dentist's office. Her employer paid for that visit. At that time, an Xray was obtained and she states that fragments from the R molars were present. Pt has had no dental pain for about 6 months ago until 2-34 days ago when she developed pain assoc with the L lower most posterior tooth. The tooth doesn't hurt when touched but is causing pain in her jaw, check, TMJ, and R ear. When her toothbrush bristle went down into a "hole" in the tooth it caused excruciating pain. Eating also makes the pain worse. The only thing that provides relief is a hand held vibrator applied to the cheek area. She has hydrocodone 5/325 and took up to 2 at a time and it didn't touch the pain. She has not had a fever nor noticed any swelling. She has chronic HA but they are not worse. She has no allergy sxs. Her swallowing is not affected. She has tried clove oil without relief.   She is also noticing R shoulder pain and R arm weakness. 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 A few days prior to the tooth pain, she used that arm more to stuff cushions and her arm pain and weakness has been progressing.  Physical Exam: Blood pressure 121/77, pulse 85, temperature 98.6 F (37 C), temperature source Oral, resp. rate 17, last menstrual period 05/08/2013, SpO2 98.00%. General appearance: alert, cooperative and mild distress  Head: Normocephalic, without obvious abnormality, atraumatic  Eyes: Sclera clear, EOMI  Ears: Nl TM on R, no pain with manipulation of R pinna  Nose: Nares normal. Septum midline. Mucosa normal. No drainage or  sinus tenderness.  Throat: normal findings: lips normal without lesions, buccal mucosa normal, palate normal, tongue midline and normal and oropharynx pink & moist without lesions or evidence of thrush and abnormal findings: dentition: poor  Neck: no adenopathy and supple, symmetrical, trachea midline  Lungs: clear to auscultation bilaterally  Heart: regular rate and rhythm, S1, S2 normal, no murmur, click, rub or gallop  Abdomen: + BS  Extremities: extremities normal, atraumatic, no cyanosis or edema  Skin: Skin color, texture, turgor normal. No rashes or lesions  Lymph nodes: Cervical, supraclavicular, and axillary nodes normal. and No cervical, post auricular, mandibular LAD  Neurologic: Grossly normal except 4/5 strength all RUE muscles  Lab results: No results found for this or any previous visit (from the past 24 hour(s)).  Imaging results:  No results found.  Assessment and Plan: I have seen and evaluated the patient as outlined above. I agree with the formulated Assessment and Plan as detailed in the residents' admission note, with the following changes:   1. Uncontrolled tooth pain - Ideally, we would manage this as an outpt. The pt has BCBS but is doesn't provide dental coverage. She is not eligible for the dental clinic as she doesn't have the orange card. She has tried to manage the pain with oral opioids and has failed. Therefore, admit, obtain panorex, and obtain oral surgery consult in the AM. ABX are not required as there is no  evidence of an infxn - no abscess on exam, no fever, no overt swelling.  NPO after midnight just in case. She has never required ABX prior to dental surgery. She has no known cardiac or pul issues although she does smoke  PPD.   2. Tobacco use - can cont to counsel cessation as an outpt.    Burns Spain, MD 6/5/20148:55 PM

## 2013-05-26 NOTE — Progress Notes (Signed)
Report was called to Lauren on [address] [place].  Pt was transferred via wheelchair to [address].  Angelina Ok, RN 05/26/2013 5:35 PM.

## 2013-05-26 NOTE — Progress Notes (Signed)
This is a Psychologist, occupational Note.  The care of the patient was discussed with Dr. Rogelia Boga and the assessment and plan was formulated with their assistance.  Please see their note for official documentation of the patient encounter.   Subjective:   Patient ID: Whitney Wagner female   DOB: [birth year] 37 y.o.   MRN: [id number]  HPI: Whitney Wagner is a 37 y.o. female with a history of poor dentition requiring multiple extractions who presents with 3 days of right sided jaw and ear pains. Whitney Wagner reports a "drilling" sensation in her right ear that began 3 days ago and has been progressively getting worse. The pain is currently a 10/10 pain, unalleviated by hydrocodone or clove oil. Other oral treatments do not help the pain. In fact, patient notes that a tooth brush bristle which "went inside" a cavity in a right molar sent a shooting pain through the same distribution as her current pain. Pain is made better only by a Administrator, sports. Pain is made worse by talking, eating, smoking, and lying down. She reports new onset tinnitus without an apparent decrease in ability to hear. Patient has loss of appetite secondary to difficulty with chewing. She has headaches at baseline which are currently no worse than usually. She acknowledges some right sided neck and shoulder pains. 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 She denies fevers, chills, chest pain, or shortness of breath. There is no associated itchy eyes or rhinorrhea.   Past Medical History  Diagnosis Date  . PCOS (polycystic ovarian syndrome)   . Fibroids    Current Outpatient Prescriptions  Medication Sig Dispense Refill  . cetirizine (ZYRTEC) 10 MG tablet Take 10 mg by mouth daily as needed for allergies.      Marland Kitchen ibuprofen (ADVIL,MOTRIN) 200 MG tablet Take  600-800 mg by mouth every 6 (six) hours as needed for pain.      Marland Kitchen oxyCODONE-acetaminophen (PERCOCET/ROXICET) 5-325 MG per tablet Take 2 tablets by mouth every 4 (four) hours as needed for pain.  15 tablet  0  . traMADol (ULTRAM) 50 MG tablet Take 1 tablet (50 mg total) by mouth every 6 (six) hours as needed for pain.  60 tablet  0   No current facility-administered medications for this visit.   Family History  Problem Relation Age of Onset  . Diabetes Brother   . Hyperlipidemia Brother   . Fibromyalgia Mother   . Cancer Maternal Aunt     breast cancer   History   Social History  . Marital Status: Single    Spouse Name: N/A    Number of Children: N/A  . Years of Education: N/A   Social History Main Topics  . Smoking status: Current Every Day Smoker -- 0.50 packs/day    Types: Cigarettes  . Smokeless tobacco: Never Used  . Alcohol Use: No  . Drug Use: No  . Sexually Active: None   Other Topics Concern  . None   Social History Narrative  . None   Review of Systems: A comprehensive 12 point review of systems was performed and is negative except as stated above. Objective:  Physical Exam: Filed Vitals:   05/26/13 1525  BP: 127/84  Pulse: 96  Temp: 97.8 F (36.6 C)  TempSrc: Oral  Height: 5\' 2"  (1.575 m)  Weight: 266 lb 3.2 oz (120.748 kg)  SpO2: 98%   General appearance: alert, cooperative and mild distress Head: Normocephalic, without obvious abnormality, atraumatic Ears: normal TM's and external ear canals both ears Nose: Nares normal. Septum midline. Mucosa normal. No drainage or sinus tenderness. Throat: multiple dental caries present, specifically noting caries on right molar (30). moist mucus membranes Neck: no adenopathy, supple, symmetrical, trachea midline, thyroid not enlarged, symmetric, no tenderness/mass/nodules and tender to palpation along the right jaw and at the right TMJ Lungs: clear to auscultation bilaterally and normal work of  breathing Heart: regular rate and rhythm, S1, S2 normal, no murmur, click, rub or gallop Abdomen: obese, soft, nontender. bowel sounds present Pulses: 2+ and symmetric Skin: Skin color, texture, turgor normal. No rashes or lesions Neurologic: Motor: strength 4+/5 in right SCM, trapezius, deltoid, bicep, and tricep. Strength 5/5 in left SCM, trapezius, deltoid, bicep, tricep.  Motor exam was limited due to patient reported pain. Assessment & Plan:  Whitney Wagner is a 37 y.o. female with a history of multiple dental caries requiring extraction presenting with 3 days of increasingly severe right sided jaw and ear pain.  Tooth pain Assessment Patient has a history of poor dentition requiring multiple extractions now presenting with 3-4 days of increasing pain in jaw and radiating to her ear. Pain is unrelieved by 10mg  hydrocodone. Patient is unable to obtain dental care in the immediate future due to finances.  Plan Given the patient's lack of dental insurance or orange card in the face of unalleviated pain, we will admit the patient for observation. We suggest IV analgesics to manage her pain and a Panorex (panoramic radiograph) to further evaluate her condition. Would consult oral surgery first thing in the morning for possible extraction.

## 2013-05-26 NOTE — Assessment & Plan Note (Signed)
Assessment Patient has a history of poor dentition requiring multiple extractions now presenting with 3-4 days of increasing pain in jaw and radiating to her ear. Pain is unrelieved by 10mg  hydrocodone. Patient is unable to obtain dental care in the immediate future due to finances.  Plan Given the patient's lack of dental insurance or orange card in the face of unalleviated pain, we will admit the patient for observation. We suggest IV analgesics to manage her pain and a Panorex (panoramic radiograph) to further evaluate her condition. Would consult oral surgery first thing in the morning for possible extraction.

## 2013-05-27 DIAGNOSIS — R51 Headache: Secondary | ICD-10-CM

## 2013-05-27 DIAGNOSIS — Z8614 Personal history of Methicillin resistant Staphylococcus aureus infection: Secondary | ICD-10-CM

## 2013-05-27 LAB — CBC
MCHC: 34.8 g/dL (ref 30.0–36.0)
Platelets: 248 10*3/uL (ref 150–400)
RDW: 12.6 % (ref 11.5–15.5)
WBC: 9.1 10*3/uL (ref 4.0–10.5)

## 2013-05-27 LAB — SURGICAL PCR SCREEN
MRSA, PCR: POSITIVE — AB
Staphylococcus aureus: POSITIVE — AB

## 2013-05-27 MED ORDER — HYDROMORPHONE HCL 2 MG PO TABS: 1.0000 mg | ORAL_TABLET | ORAL | Status: DC | PRN

## 2013-05-27 MED ORDER — MUPIROCIN 2 % EX OINT
1.0000 "application " | TOPICAL_OINTMENT | Freq: Two times a day (BID) | CUTANEOUS | Status: DC
  Administered 2013-05-27 (×2): 1 via NASAL
  Filled 2013-05-27: qty 22

## 2013-05-27 MED ORDER — LIDOCAINE VISCOUS 2 % MT SOLN
15.0000 mL | OROMUCOSAL | Status: DC | PRN
  Filled 2013-05-27: qty 15

## 2013-05-27 MED ORDER — HYDROCODONE-ACETAMINOPHEN 5-325 MG PO TABS
1.0000 | ORAL_TABLET | ORAL | Status: DC | PRN
  Administered 2013-05-27: 2 via ORAL
  Filled 2013-05-27: qty 2

## 2013-05-27 MED ORDER — OXYCODONE-ACETAMINOPHEN 5-325 MG PO TABS
1.0000 | ORAL_TABLET | ORAL | Status: DC | PRN
  Filled 2013-05-27: qty 2

## 2013-05-27 MED ORDER — IBUPROFEN 800 MG PO TABS: 800.0000 mg | ORAL_TABLET | Freq: Four times a day (QID) | ORAL | Status: DC | PRN

## 2013-05-27 MED ORDER — AMOXICILLIN 500 MG PO CAPS: 500.0000 mg | ORAL_CAPSULE | Freq: Three times a day (TID) | ORAL | Status: DC

## 2013-05-27 MED ORDER — CHLORHEXIDINE GLUCONATE CLOTH 2 % EX PADS
6.0000 | MEDICATED_PAD | Freq: Every day | CUTANEOUS | Status: DC
  Administered 2013-05-27: 6 via TOPICAL

## 2013-05-27 MED ORDER — LIDOCAINE VISCOUS 2 % MT SOLN: 15.0000 mL | OROMUCOSAL | Status: DC | PRN

## 2013-05-27 NOTE — Care Management Note (Signed)
  Page 1 of 1   05/27/2013     11:59:00 AM   CARE MANAGEMENT NOTE 05/27/2013  Patient:  Whitney Wagner, Whitney Wagner   Account Number:  [id number]  Date Initiated:  05/27/2013  Documentation initiated by:  Ronny Flurry  Subjective/Objective Assessment:     Action/Plan:   Anticipated DC Date:     Anticipated DC Plan:           Choice offered to / List presented to:             Status of service:   Medicare Important Message given?   (If response is "NO", the following Medicare IM given date fields will be blank) Date Medicare IM given:   Date Additional Medicare IM given:    Discharge Disposition:    Per UR Regulation:    If discussed at Long Length of Stay Meetings, dates discussed:    Comments:  05-27-13 Referral from bedside nurse for follow up dental appointment.  Confirmed with patient she does have BCBS , but does not have dental coverage.  [facility] [phone] . Patient already aware of clinic and has been seen there .  Ronny Flurry RN BSN [phone]

## 2013-05-27 NOTE — Progress Notes (Signed)
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  Protocol implemented.  Lashan Gluth Hayford.RN

## 2013-05-27 NOTE — Progress Notes (Signed)
Patient was given discharge instructions along with prescription. Medications have been called in to pharmacy of choice by discharging physician. Follow up appointment and contact information provided. Patient verbalized understanding of discharge instructions.

## 2013-05-27 NOTE — Progress Notes (Signed)
I spoke with patient in reference to medication allergy and she states percocet causes the hive and itching not hydrocodone. She states she was taking hydrocodone at home for pain prior to admission without any adverse reactions.

## 2013-05-27 NOTE — Progress Notes (Signed)
Internal Medicine Attending  Date: 05/27/2013  Patient name: Whitney Wagner Medical record number: [id number] Date of birth: 12/22/1976 Age: 37 y.o. Gender: female  I saw and evaluated the patient. I reviewed the resident's note by Dr. Lavena Bullion and I agree with the resident's findings and plans as documented in his note.  Dental x-rays showed no definite evidence of periapical abscess; no evidence of fracture  or dislocation.  As noted by Dr. Lavena Bullion, we were unable to arrange dental care as inpatient, and requested case management assistance in arranging outpatient care.

## 2013-05-27 NOTE — Progress Notes (Addendum)
Subjective:    Pt complains of persistent R mandibular molar pain radiating to her R mandible and R TMJ area. Denies fever. No other complaints.   Interval Events: No acute events.    Objective:    Vital Signs:   Temp:  [97.8 F (36.6 C)-98.6 F (37 C)] 98.2 F (36.8 C) (06/06 0516) Pulse Rate:  [74-96] 74 (06/06 0516) Resp:  [16-17] 16 (06/06 0516) BP: (105-127)/(58-84) 105/58 mmHg (06/06 0516) SpO2:  [98 %] 98 % (06/06 0516) Weight:  [266 lb (120.657 kg)-266 lb 3.2 oz (120.748 kg)] 266 lb (120.657 kg) (06/06 0500) Last BM Date: 05/27/13  24-hour weight change: Weight change:   Intake/Output:   Intake/Output Summary (Last 24 hours) at 05/27/13 1231 Last data filed at 05/27/13 0943  Gross per 24 hour  Intake    500 ml  Output      0 ml  Net    500 ml      Physical Exam: General: Vital signs reviewed and noted. Well-developed, well-nourished, in no acute distress; alert, appropriate and cooperative throughout examination.  HEENT: Tenderness to percussion of R mandibular molar. No oral/oropharyngeal edema or erythema.   Lungs:  Normal respiratory effort. Clear to auscultation BL without crackles or wheezes.  Heart: RRR. S1 and S2 normal without gallop, murmur, or rubs.  Abdomen:  BS normoactive. Soft, Nondistended, non-tender.  No masses or organomegaly.  Extremities: No pretibial edema.     Labs:  Basic Metabolic Panel:  Recent Labs Lab 05/26/13 2116  NA 138  K 3.4*  CL 101  CO2 27  GLUCOSE 143*  BUN 10  CREATININE 0.58  CALCIUM 9.4   CBC:  Recent Labs Lab 05/26/13 2000 05/27/13 0450  WBC 9.2 9.1  NEUTROABS 5.6  --   HGB 14.3 13.5  HCT 41.1 38.8  MCV 85.4 86.0  PLT 244 248   Microbiology: Results for orders placed during the hospital encounter of 05/26/13  SURGICAL PCR SCREEN     Status: Abnormal   Collection Time    05/26/13 11:16 PM      Result Value Range Status   MRSA, PCR POSITIVE (*) NEGATIVE Final   Comment: RESULT CALLED TO,  READ BACK BY AND VERIFIED WITH:     S.HAYFORD RN 0124 05/27/13 E.GADDY   Staphylococcus aureus POSITIVE (*) NEGATIVE Final   Comment:            The Xpert SA Assay (FDA     approved for NASAL specimens     in patients over 20 years of age),     is one component of     a comprehensive surveillance     program.  Test performance has     been validated by [employer] for patients greater     than or equal to 57 year old.     It is not intended     to diagnose infection nor to     guide or monitor treatment.     RESULT CALLED TO, READ BACK BY AND VERIFIED WITH:     S.HAYFORD RN 0124 05/27/13 E.GADDY    Coagulation Studies:  Recent Labs  05/26/13 2116  LABPROT 12.8  INR 0.97    Imaging: Dg Orthopantogram  05/26/2013   *RADIOLOGY REPORT*  Clinical Data: Right jaw and neck pain.  ORTHOPANTOGRAM/PANORAMIC  Comparison: None.  Findings: There is no evidence of fracture or dislocation.  The mandible appears intact.  No periapical abscesses are identified.  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  The visualized paranasal sinuses and mastoid air cells are well- aerated.  IMPRESSION: No definite evidence of periapical abscess; no evidence of fracture or dislocation.   Original Report Authenticated By: Tonia Ghent, M.D.       Medications:    Infusions:    Scheduled Medications: . Chlorhexidine Gluconate Cloth  6 each Topical Q0600  . heparin  5,000 Units Subcutaneous Q8H  . mupirocin ointment  1 application Nasal BID  . nicotine  21 mg Transdermal Daily  . sodium chloride  3 mL Intravenous Q12H    PRN Medications: sodium chloride, HYDROmorphone, lidocaine, sodium chloride   Assessment/ Plan:   Tooth pain - Pt appears to have caries of her remaining R mandibular molar. No evidence of abscess. No oral surgeon on call today. Discussed the case with the dentist on call, Dr. Oswaldo Done, who instructs that the patient should  be discharged home with pain medication and instructed to f/u with his clinic on Monday. He does not feel any inpatient intervention would be warranted for this issue. Care management and social work were consulted, and pt was informed of available options for outpatient treatment of this issue, which include returning to the health department versus calling Dr. Jacqualin Combes clinic where she might be able to receive discounted care if an oral surgeon is available on Monday (per Dr. Oswaldo Done) - oral dilaudid PRN (pt states hydrocodone completely ineffective and allergic to percocet; also insists dilaudid is the only pain medication that works for this issue) - ibuprofen PRN - lidocaine viscous solution PRN - f/u with dentistry   DVT PPX - heparin  CODE STATUS - full  CONSULTS PLACED - N/A  DISPO - Discharge home today with close dental f/u as unfortunately the pt's dental issues were unable to be fully addressed during her hospital stay.   SERVICE NEEDED AT DISCHARGE - TO BE DETERMINED DURING HOSPITAL COURSE         Y = Yes, Blank = No PT:   OT:   RN:   Equipment:   Other:      Length of Stay: 1 day(s)   Signed: Elfredia Nevins, MD  PGY-1, Internal Medicine Resident Pager: [phone] (7AM-5PM) 05/27/2013, 12:31 PM     Addendum: discussed the patient's case with Dr. Kristin Bruins who will be attempting to have the patient follow-up in his office within 1-2 weeks to be seen for possible extraction. Dr. Kristin Bruins also recommended a 10-day course of amoxicillin which will be prescribed at time of discharge today. Greatly appreciate Dr. Luretha Murphy helpful input regarding this patient's care and follow-up.

## 2013-05-27 NOTE — Discharge Summary (Signed)
Patient Name: Whitney Wagner  MRN:  [id number]   DOB: 21-Oct-1976   PCP: Almyra Deforest, MD         Date of Admission: 05/26/2013  Date of Discharge: 05/27/2013        Attending Physician: Dr. Meredith Pel      DISCHARGE DIAGNOSES: Tooth pain    DISPOSITION AND FOLLOW-UP: Nery L Carbone is to follow-up with the listed providers as detailed below, at which time, the following should be addressed:   1. Assess patient's complaints of R mandibular pain and consider R mandibular molar extraction.   2. Labs / imaging needed at time of follow-up: N/A  3. Pending labs/ test needing follow-up: N/A    DISCHARGE INSTRUCTIONS: Follow-up Information   Follow up with Sedonia Small., DDS. Call on 05/27/2013. ([facility] (as instructed by care management) to see if you can be seen today. If you cannot be seen today, call or walk-in to Dr. Jacqualin Combes clinic on 05/30/2013 (Monday) to be seen for your dental cavity. )    Contact information:   [address] [place] [address] [phone]       Follow up with Charlynne Pander, DDS. (You will be called with an appointment to be seen within 1-2 weeks. Please be sure to make this appointment if the issue has not already been addressed by another dentist. )    Contact information:   [address] [place] [address] [phone]      Discharge Orders   Future Orders Complete By Expires     Diet - low sodium heart healthy  As directed     Increase activity slowly  As directed         DISCHARGE MEDICATIONS:   Medication List    TAKE these medications       amoxicillin 500 MG capsule  Commonly known as:  AMOXIL  Take 1 capsule (500 mg total) by mouth 3 (three) times daily.     HYDROmorphone 2 MG tablet  Commonly known as:  DILAUDID  Take 0.5-1 tablets (1-2 mg total) by mouth every 4 (four) hours as needed.     ibuprofen 800 MG tablet  Commonly known as:  ADVIL,MOTRIN  Take  1 tablet (800 mg total) by mouth every 6 (six) hours as needed for pain.     lidocaine 2 % solution  Commonly known as:  XYLOCAINE  Take 15 mLs by mouth every 3 (three) hours as needed.     traMADol 50 MG tablet  Commonly known as:  ULTRAM  Take 1 tablet (50 mg total) by mouth every 6 (six) hours as needed for pain.        PROCEDURES PERFORMED:  Dg Orthopantogram  05/26/2013   *RADIOLOGY REPORT*  Clinical Data: Right jaw and neck pain.  ORTHOPANTOGRAM/PANORAMIC  Comparison: None.  Findings: There is no evidence of fracture or dislocation.  The mandible appears intact.  No periapical abscesses are identified. 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  The visualized paranasal sinuses and mastoid air cells are well- aerated.  IMPRESSION: No definite evidence of periapical abscess; no evidence of fracture or dislocation.   Original Report Authenticated By: Tonia Ghent, M.D.   US Transvaginal Non-ob  04/28/2013   *RADIOLOGY REPORT*  Clinical Data: Lower abdominal pain.  History of fibroids.  TRANSABDOMINAL AND TRANSVAGINAL ULTRASOUND OF PELVIS Technique:  Both transabdominal and transvaginal ultrasound examinations  of the pelvis were performed. Transabdominal technique was performed for global imaging of the pelvis including uterus, ovaries, adnexal regions, and pelvic cul-de-sac.  It was necessary to proceed with endovaginal exam following the transabdominal exam to visualize the uterus, endometrium, ovaries, and adnexa.  Comparison:  Ultrasound dated 11/16/2007  Findings:  Uterus: 11.1 x 6.3 x 7.2 cm.  There is a 5.6 94.6 x 5.6 cm fibroid in the uterine fundus.  There is also a 6.0 x 5.3 x 5.7 cm fibroid in the lower uterine segment.  Endometrium: Normal.  11.1 cm in thickness.  Right ovary:  Normal.  4.1 x 2.2 x 2.4 cm.  Left ovary: Normal.  2.7 x 2.4 x 1.8 cm.  Other findings: Trace free fluid.  IMPRESSION:  The patient now has two large  fibroids in the uterus, a marked change since the prior exam.   Original Report Authenticated By: Francene Boyers, M.D.   US Pelvis Complete  04/28/2013   *RADIOLOGY REPORT*  Clinical Data: Lower abdominal pain.  History of fibroids.  TRANSABDOMINAL AND TRANSVAGINAL ULTRASOUND OF PELVIS Technique:  Both transabdominal and transvaginal ultrasound examinations of the pelvis were performed. Transabdominal technique was performed for global imaging of the pelvis including uterus, ovaries, adnexal regions, and pelvic cul-de-sac.  It was necessary to proceed with endovaginal exam following the transabdominal exam to visualize the uterus, endometrium, ovaries, and adnexa.  Comparison:  Ultrasound dated 11/16/2007  Findings:  Uterus: 11.1 x 6.3 x 7.2 cm.  There is a 5.6 94.6 x 5.6 cm fibroid in the uterine fundus.  There is also a 6.0 x 5.3 x 5.7 cm fibroid in the lower uterine segment.  Endometrium: Normal.  11.1 cm in thickness.  Right ovary:  Normal.  4.1 x 2.2 x 2.4 cm.  Left ovary: Normal.  2.7 x 2.4 x 1.8 cm.  Other findings: Trace free fluid.  IMPRESSION:  The patient now has two large fibroids in the uterus, a marked change since the prior exam.   Original Report Authenticated By: Francene Boyers, M.D.       ADMISSION DATA: H&P: Whitney Wagner is a 37 y.o. female with a history of tobacco abuse and poor dentition requiring multiple extractions (4 teeth and 2 wisdom teeth), who presents with right sided jaw and tooth pain.  Patient reports that she started having right jaw pain at 3 days ago. She describes having a "drilling" sensation in her right jaw that began 3 days ago and has been progressively getting worse. It is radiating to the ear at the same side. There is no ear discharge. The pain is currently a 10/10 pain, unalleviated by hydrocodone or clove oil. Other oral treatments do not help the pain. In fact, patient notes that a tooth brush bristle which "went inside" a cavity in a right molar sent  a shooting pain through the same distribution as her current pain. Pain is made better only by a Administrator, sports. Pain is made worse by talking, eating, smoking, and lying down. She reports new onset tinnitus without an apparent decrease in ability to hear. Patient has loss of appetite secondary to difficulty with chewing. She has headaches at baseline which are currently no worse than usually. She acknowledges some right sided neck and shoulder pains. 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  Patient  does not have history of diabetes mellitus. She denies fever, chills, headaches, cough, chest pain, SOB, abdominal pain,diarrhea, constipation, dysuria, urgency, frequency, hematuria.    Physical Exam: General: Not in acute distress, but in obvious pain  HEENT: Normocephalic, without obvious abnormality, atraumatic, normal TM's and external ear canals both ears, moist mucus membranes. There are multiple dental caries present, missing 4 teeth. no neck adenopathy. There is tenderness to palpation along the right jaw and at the right TMJ. No warmth locally. There is no obvious swelling or abscess over the right jaw.  Cardiac: S1/S2, RRR, No murmurs, gallops or rubs  Pulm: Good air movement bilaterally. Clear to auscultation bilaterally. No rales, wheezing, rhonchi or rubs.  Abd: Soft, nondistended, nontender, no rebound pain, no organomegaly, BS present  Ext: No edema. 2+DP/PT pulse bilaterally  Musculoskeletal: No joint deformities, erythema, or stiffness, ROM full  Skin: No rashes.  Neuro: Alert and oriented X3, cranial nerves II-XII grossly intact, muscle strength 5/5 in all extremeties, sensation to light touch intact. Brachial reflex 2+ bilaterally. Knee reflex 1+ bilaterally.  Psych: Patient is not psychotic, no suicidal or hemocidal  ideation.   HOSPITAL COURSE: Tooth pain - patient presented with pain in her remaining R mandibular molar radiating to the R mandible/TMJ. Exam revealed dental caries the R mandibular molar with tenderness to percussion, but was unrevealing of any facial swelling, oral/oropharyngeal edema/erythema or any other abnormalities that would suggest infection outside of the tooth itself. Pt had a panorex performed which was unrevealing of periapical abscess, fracture, or dislocation. No oral surgeon was available on call. Dr. Oswaldo Done who was on call for dentistry recommended discharge and outpatient follow-up at his office on Monday (case discussed mid-day on Friday).  Care management and CSW were consulted to provide additional options, and informed the patient that the only service they were aware of that would fit her needs would be the dental clinic [facility], where the patient has had previous extractions. The case was also discussed with Dr. Lynnell Catalan who stated that as the patient has no dental insurance he would try to have her seen in his clinic ASAP, however this could be up to 1-2 weeks.  The patient has an allergy to oxycodone and states that hydrocodone is ineffective. She insists that dilaudid is the only pain medication that worked for the issue, and was discharged with a small supply of PO dilaudid to take PRN until the extraction can be performed. She was also given a prescription for ibuprofen and lidocaine gel to use as needed. As recommended by Dr. Kristin Bruins, she was discharged on a 10-day course of amoxicillin. At the time of discharge the patient continued to complain of pain but appeared very comfortable in bed while talking on the phone and scrolling through her mobile phone. This was noted while she had received only norco in recent hours (IV dilaudid was d/c'd this AM), thus suspect her pain should be well-controlled on her discharge regimen until she is able to be seen for the  extraction.   DISCHARGE DATA: Vital Signs: BP 105/58  Pulse 74  Temp(Src) 98.2 F (36.8 C) (Oral)  Resp 16  Wt 266 lb (120.657 kg)  BMI 48.64 kg/m2  SpO2 98%  LMP 05/08/2013  Labs: Results for orders placed during the hospital encounter of 05/26/13 (from the past 24 hour(s))  CBC WITH DIFFERENTIAL     Status: None   Collection Time    05/26/13  8:00 PM  Result Value Range   WBC 9.2  4.0 - 10.5 K/uL   RBC 4.81  3.87 - 5.11 MIL/uL   Hemoglobin 14.3  12.0 - 15.0 g/dL   HCT 95.6  21.3 - 08.6 %   MCV 85.4  78.0 - 100.0 fL   MCH 29.7  26.0 - 34.0 pg   MCHC 34.8  30.0 - 36.0 g/dL   RDW 57.8  46.9 - 62.9 %   Platelets 244  150 - 400 K/uL   Neutrophils Relative % 60  43 - 77 %   Neutro Abs 5.6  1.7 - 7.7 K/uL   Lymphocytes Relative 29  12 - 46 %   Lymphs Abs 2.7  0.7 - 4.0 K/uL   Monocytes Relative 6  3 - 12 %   Monocytes Absolute 0.6  0.1 - 1.0 K/uL   Eosinophils Relative 4  0 - 5 %   Eosinophils Absolute 0.3  0.0 - 0.7 K/uL   Basophils Relative 1  0 - 1 %   Basophils Absolute 0.1  0.0 - 0.1 K/uL  BASIC METABOLIC PANEL     Status: Abnormal   Collection Time    05/26/13  9:16 PM      Result Value Range   Sodium 138  135 - 145 mEq/L   Potassium 3.4 (*) 3.5 - 5.1 mEq/L   Chloride 101  96 - 112 mEq/L   CO2 27  19 - 32 mEq/L   Glucose, Bld 143 (*) 70 - 99 mg/dL   BUN 10  6 - 23 mg/dL   Creatinine, Ser 5.28  0.50 - 1.10 mg/dL   Calcium 9.4  8.4 - 41.3 mg/dL   GFR calc non Af Amer >90  >90 mL/min   GFR calc Af Amer >90  >90 mL/min  PROTIME-INR     Status: None   Collection Time    05/26/13  9:16 PM      Result Value Range   Prothrombin Time 12.8  11.6 - 15.2 seconds   INR 0.97  0.00 - 1.49  APTT     Status: None   Collection Time    05/26/13  9:16 PM      Result Value Range   aPTT 26  24 - 37 seconds  SURGICAL PCR SCREEN     Status: Abnormal   Collection Time    05/26/13 11:16 PM      Result Value Range   MRSA, PCR POSITIVE (*) NEGATIVE   Staphylococcus  aureus POSITIVE (*) NEGATIVE  CBC     Status: None   Collection Time    05/27/13  4:50 AM      Result Value Range   WBC 9.1  4.0 - 10.5 K/uL   RBC 4.51  3.87 - 5.11 MIL/uL   Hemoglobin 13.5  12.0 - 15.0 g/dL   HCT 24.4  01.0 - 27.2 %   MCV 86.0  78.0 - 100.0 fL   MCH 29.9  26.0 - 34.0 pg   MCHC 34.8  30.0 - 36.0 g/dL   RDW 53.6  64.4 - 03.4 %   Platelets 248  150 - 400 K/uL     Time spent on discharge: 35 minutes  Services Ordered on Discharge: Y = Yes; Blank = No PT:   OT:   RN:   Equipment:   Other:     Signed: Elfredia Nevins, MD   PGY 1, Internal Medicine Resident 05/27/2013, 5:50 PM

## 2013-06-01 ENCOUNTER — Ambulatory Visit (HOSPITAL_COMMUNITY): Payer: BC Managed Care – PPO | Admitting: Dentistry

## 2013-06-01 ENCOUNTER — Encounter (HOSPITAL_COMMUNITY): Payer: Self-pay | Admitting: Dentistry

## 2013-06-01 VITALS — BP 126/83 | HR 85 | Temp 98.6°F | Ht 62.0 in | Wt 266.0 lb

## 2013-06-01 DIAGNOSIS — K045 Chronic apical periodontitis: Secondary | ICD-10-CM

## 2013-06-01 DIAGNOSIS — K083 Retained dental root: Secondary | ICD-10-CM

## 2013-06-01 DIAGNOSIS — K036 Deposits [accretions] on teeth: Secondary | ICD-10-CM

## 2013-06-01 DIAGNOSIS — K029 Dental caries, unspecified: Secondary | ICD-10-CM

## 2013-06-01 DIAGNOSIS — K0401 Reversible pulpitis: Secondary | ICD-10-CM

## 2013-06-01 DIAGNOSIS — K0530 Chronic periodontitis, unspecified: Secondary | ICD-10-CM

## 2013-06-01 DIAGNOSIS — K08409 Partial loss of teeth, unspecified cause, unspecified class: Secondary | ICD-10-CM

## 2013-06-01 DIAGNOSIS — M264 Malocclusion, unspecified: Secondary | ICD-10-CM

## 2013-06-01 HISTORY — PX: OTHER SURGICAL HISTORY: SHX169

## 2013-06-01 MED ORDER — HYDROMORPHONE HCL 2 MG PO TABS: 1.0000 mg | ORAL_TABLET | ORAL | Status: DC | PRN

## 2013-06-01 NOTE — Progress Notes (Signed)
Patient:            Whitney Wagner Date of Birth:  August 31, 1976 MRN:                [id number]   DATE OF PROCEDURE:  06/01/2013               OPERATIVE REPORT   PREOPERATIVE DIAGNOSES: 1. Acute pulpitis 2. Apical periodontitis 3. Dental caries 4. Multiple retained root segments  POSTOPERATIVE DIAGNOSES: 1. Acute pulpitis 2. Apical periodontitis 3. Dental caries 4. Multiple retained root segments  OPERATIONS: 1. Multiple extraction of tooth numbers 5,12,and 30 with alveoloplasty.   SURGEON: Charlynne Pander, DDS  ASSISTANT: Zettie Pho, (dental assistant)  ANESTHESIA: Local anesthesia only.   MEDICATIONS: 1. Local anesthesia with a total utilization of 4 carpules each containing 34 mg of lidocaine with 0.017 mg of epinephrine.  SPECIMENS: There are three teeth that were discarded.  DRAINS: None  CULTURES: None  COMPLICATIONS: None   ESTIMATED BLOOD LOSS: Less than 10 mls.  INTRAVENOUS FLUIDS: None  INDICATIONS: The patient was recently diagnosed with acute pulpitis with apical periodontitis.  A dental consultation was then requested to evaluate the acute dental pain and provide treatment is indicated.  The patient was examined and treatment planned for extraction tooth numbers 5, 12, and 30 with alveoloplasty as needed.  This treatment plan was formulated to decrease the risks and complications associated with dental infection from affecting the patient's systemic health.  OPERATIVE FINDINGS: Patient was examined operatory #1.  The teeth were identified for extraction. The patient was noted be affected by acute pulpitis, apical periodontitis, multiple retained root segments, and extensive dental caries.   DESCRIPTION OF PROCEDURE: Patient was brought to the main operatory #1. Patient was then placed in the sitting position and the dental chair. Informed consent was obtained and witnessed. The patient was then prepped and draped in the usual manner for  dental medicine procedure. A timeout was performed. The patient was identified and procedures were verified. The oral cavity was then thoroughly examined with the findings noted above. The patient was then ready for dental medicine procedure as follows:  Local anesthesia was then administered sequentially with a total utilization of four carpules each containing 34 mg of lidocaine with 0.017 mg of epinephrine.  The Maxillary left and right quadrants were first approached. Anesthesia was then delivered utilizing infiltration with lidocaine with epinephrine.  A mandibular right inferior alveolar nerve block and long buccal nerve block were then given utilizing the lidocaine with epinephrine. Further infiltration was then utilized with the lidocaine with epinephrine.  At this point in time tooth #30 was approached.  A 15 blade incision was made from the distal of #32 and extended the mesial of #29.  A  surgical flap was then carefully reflected. Appropriate amounts of buccal and interseptal bone were then removed utilizing a surgical handpiece and bur and copious amounts of sterile saline.  Tooth #30 was then subluxated with a series of straight elevators. Tooth #30 that had the coronal portion and mesial root removed with a 23 forceps leaving the distal root remaining. Further bone was then removed with a surgical handpiece and bur and copious amounts of sterile water. The remaining root was then removed with a 151 forceps without further complication. Alveoloplasty some performed utilizing a rongeur and bone file. The tissues were approximated and trimmed appropriately. The surgical site was then irrigated with copious amounts sterile saline. The surgical site was then closed from  the distal of #32 and extended to the distal of #29 utilizing 3-0 chromic gut suture in a continuous interrupted suture technique x1.   At this point in time tooth #5 was approached. A 15 blade incision was made around tooth #5 in a  sulcular fashion. Tooth #5 was then subluxated with a series straight elevators. At this point time a surgical handpiece and bur and copious of sterile water were utilized to remove buccal and palatal bone as needed. The retained root was then removed with a 150 forceps without complications. Alveoloplasty was then performed utilizing a rongeur and bone file. The surgical site was then irrigated with copious amounts sterile saline. The surgical site was then closed from the mesial numbers 4 and extended to the distal of #6 utilizing 3-0 chromic gut suture in a continuous interrupted suture technique x1.  At this point time tooth #12 was approached. 15 blade incision was used in a sulcular fashion around tooth #12.  Tooth #12 was then subluxated with a series straight elevators. Tooth #12 was then removed with a 150 forceps without complications. The socket was curetted and compressed appropriately. Surgical site was then irrigated with copious amounts of sterile saline. The surgical site was then closed in a figure-of-eight fashion utilizing 3-0 chromic gut material.    At this point time, the entire mouth was irrigated with copious amounts of sterile saline. The patient was exam for complications, seeing none, the dental medicine procedure was deemed to be complete.  A series of 4 x 4 gauze were placed in the mouth to aid hemostasis. All counts were correct for the dental medicine procedure. The post-operative blood pressure was 126/84 with a pulse of 84.  Postop instructions were provided in a written and verbal format. Patient was given a prescription for Dilaudid 2 mg #20 tablets. Patient is to take one half to one tablet every 4-6 hours as needed for pain. Patient is to return to clinic in aproximately one week for evaluation for suture removal. Patient is to complete the amoxicillin antibiotic therapy as initially prescribed. After an appropriate amount of time, the patient was discharged to home in a  stable condition.  Charlynne Pander, DDS.

## 2013-06-01 NOTE — Progress Notes (Signed)
DENTAL CONSULTATION  Date of Consultation:  06/01/2013 Patient Name:   Whitney Wagner Date of Birth:   03/04/1976 Medical Record Number: [id number]  VITALS: BP 126/83  Pulse 85  Temp(Src) 98.6 F (37 C) (Oral)  Ht 5\' 2"  (1.575 m)  Wt 266 lb (120.657 kg)  BMI 48.64 kg/m2  LMP 05/08/2013   CHIEF COMPLAINT: Patient referred by Dr. Lavena Bullion for evaluation of acute toothache symptoms coming from the lower right molar.  HPI: Whitney Wagner is a 37 year-old female referred by Dr. Lavena Bullion for a dental consultation. The patient has a history of acute pulpitis symptoms coming from the lower right molar (tooth #30). This has been occurring off and on for the past 2-3 months. The pain increased in severity over the past week.  Patient currently is experiencing acute pain coming from tooth #30. Patient indicates it is currently at an intensity of 7/10 and was previously 10 out of 10 in intensity.The patient was seen By Dr. Rogelia Boga in the internal medicine clinic. The patient was subsequently admitted to [facility] on 05/26/2013. Patient was subsequently discharged on amoxicillin 500 mg every 8 hours as well as Dilaudid pain medication. Patient now presents for followup evaluation and treatment as indicated.   The patient last saw a dentist approximately 6-7 months ago to have several teeth extracted. Patient denies having any complications from those dental extractions. She does not have a regular primary dentist. The patient did not have any partial dentures. The patient is interested in having the lower right molar extracted and possibly the upper right and upper left premolars if possible.   PMH: Past Medical History  Diagnosis Date  . PCOS (polycystic ovarian syndrome)   . Osgood-Schlatter's disease of left knee   . Uterine fibroid   . GERD (gastroesophageal reflux disease)   . Migraines, neuralgic     '~ 3/week" (05/26/2013)  . Arthritis     "beginnings in my hands" (05/26/2013)   . Tooth pain 05/26/2013    "bottom right" (05/26/2013)    PSH: Past Surgical History  Procedure Laterality Date  . External fixation arm Right 1980's  . Knee arthroscopy Left 1980's    "from Osgood-Schlatter disease" (05/26/2013)    ALLERGIES: Allergies  Allergen Reactions  . Codeine Hives    MEDICATIONS: Current Outpatient Prescriptions  Medication Sig Dispense Refill  . amoxicillin (AMOXIL) 500 MG capsule Take 1 capsule (500 mg total) by mouth 3 (three) times daily.  30 capsule  0  . ibuprofen (ADVIL,MOTRIN) 800 MG tablet Take 1 tablet (800 mg total) by mouth every 6 (six) hours as needed for pain.  30 tablet  0  . lidocaine (XYLOCAINE) 2 % solution Take 15 mLs by mouth every 3 (three) hours as needed.  100 mL  0  . HYDROmorphone (DILAUDID) 2 MG tablet Take 0.5-1 tablets (1-2 mg total) by mouth every 4 (four) hours as needed for pain.  20 tablet  0  . traMADol (ULTRAM) 50 MG tablet Take 1 tablet (50 mg total) by mouth every 6 (six) hours as needed for pain.  60 tablet  0   No current facility-administered medications for this visit.    LABS: Lab Results  Component Value Date   WBC 9.1 05/27/2013   HGB 13.5 05/27/2013   HCT 38.8 05/27/2013   MCV 86.0 05/27/2013   PLT 248 05/27/2013      Component Value Date/Time   NA 138 05/26/2013 2116   K 3.4* 05/26/2013 2116  CL 101 05/26/2013 2116   CO2 27 05/26/2013 2116   GLUCOSE 143* 05/26/2013 2116   BUN 10 05/26/2013 2116   CREATININE 0.58 05/26/2013 2116   CALCIUM 9.4 05/26/2013 2116   GFRNONAA >90 05/26/2013 2116   GFRAA >90 05/26/2013 2116   Lab Results  Component Value Date   INR 0.97 05/26/2013   No results found for this basename: PTT    SOCIAL HISTORY: History   Social History  . Marital Status: Single    Spouse Name: N/A    Number of Children: N/A  . Years of Education: N/A   Occupational History  . Not on file.   Social History Main Topics  . Smoking status: Current Every Day Smoker -- 0.50 packs/day for 21 years    Types:  Cigarettes  . Smokeless tobacco: Never Used  . Alcohol Use: No  . Drug Use: No  . Sexually Active: Not Currently   Other Topics Concern  . Not on file   Social History Narrative  . No narrative on file    FAMILY HISTORY: Family History  Problem Relation Age of Onset  . Diabetes Brother   . Hyperlipidemia Brother   . Fibromyalgia Mother   . Cancer Maternal Aunt     breast cancer    DENTAL HISTORY: CHIEF COMPLAINT: Patient referred by Dr. Lavena Bullion for evaluation of acute toothache symptoms coming from the lower right molar.  HPI: Whitney Wagner is a 37 year-old female referred by Dr. Lavena Bullion for a dental consultation. The patient has a history of acute pulpitis symptoms coming from the lower right molar (tooth #30). This has been occurring off and on for the past 2-3 months. The pain increased in severity over the past week.  Patient currently is experiencing acute pain coming from tooth #30. Patient indicates it is currently at an intensity of 7/10 and was previously 10 out of 10 in intensity.The patient was seen By Dr. Rogelia Boga in the internal medicine clinic. The patient was subsequently admitted to [facility] on 05/26/2013. Patient was subsequently discharged on amoxicillin 500 mg every 8 hours as well as Dilaudid pain medication. Patient now presents for followup evaluation and treatment as indicated.   The patient last saw a dentist approximately 6-7 months ago to have several teeth extracted. Patient denies having any complications from those dental extractions. She does not have a regular primary dentist. The patient did not have any partial dentures. The patient is interested in having the lower right molar extracted and possibly the upper right and upper left premolars if possible.   DENTAL EXAMINATION:  GENERAL: The patient is a well-developed, obese female in no acute distress. HEAD AND NECK: There is no palpable submandibular lymphadenopathy. The patient has  some right TMJ tenderness today. INTRAORAL EXAM: The patient has normal saliva. There is no intraoral abscess formation noted. DENTITION: The patient is missing tooth numbers 1, 2, 3, 16, 17, 18, 19, 31, and 32. Tooth numbers 5 and 12 are present as retained root segments. PERIODONTAL: Patient has chronic periodontitis with plaque and calculus accumulations, selective areas gingival recession and no obvious tooth mobility. DENTAL CARIES/SUBOPTIMAL RESTORATIONS: Multiple dental caries are noted. I would need a full series of dental radiographs to rule out other incipient dental caries. ENDODONTIC: Patient with a history of acute pulpitis symptoms. There is periapical pathology and radiolucency associated with tooth #30.  Retained roots numbers 5 and 13 have caries extending into the pulp. CROWN AND BRIDGE: Patient has a  crown on tooth #9 that has fallen off and is lost. PROSTHODONTIC: No partial dentures OCCLUSION: Patient has a poor occlusal scheme secondary to multiple missing teeth, multiple retained root segments, and lack of replacement of missing teeth with dental prostheses.  RADIOGRAPHIC INTERPRETATION: An orthopantogram was obtained along with a periapical radiograph of tooth #30. There are multiple missing teeth. There are multiple retained root segments. Multiple dental caries are noted. Multiple areas of apical pathology and radiolucency are noted. There is incipient to moderate bone loss noted. There is supra-eruption and drifting of the unopposed teeth into the edentulous areas.   ASSESSMENTS: 1. Acute pulpitis symptoms 2. Chronic apical periodontitis 3. Dental caries 4. Multiple retained root segments 5. Chronic periodontitis with bone loss 6. Selective areas of gingival recession 7. Multiple missing teeth 8. Supra-eruption and drifting of the unopposed teeth into the edentulous area 9. Poor occlusal scheme   PLAN/RECOMMENDATIONS: 1. I discussed the risks, benefits, and  complications of various treatment options with the patient in relationship to her medical and dental conditions. We discussed various treatment options to include no treatment, multiple extractions with alveoloplasty, pre-prosthetic surgery as indicated, periodontal therapy, dental restorations, root canal therapy, crown and bridge therapy, implant therapy, and replacement of missing teeth as indicated. The patient currently wishes to proceed with extraction of tooth numbers  5, 12. and 30 with alveoloplasty as indicated.  The patient wishes to proceed with this treatment today. Patient will then followup with a primary dentist of her choice for comprehensive exam, dental radiographs, and treatment plan discussion concerning the additional dental caries noted, periodontal problems, and replacement of missing teeth as indicated.  2. Discussion of findings with medical team and coordination of future medical and dental care.  Charlynne Pander, DDS

## 2013-06-01 NOTE — Patient Instructions (Signed)

## 2013-06-08 ENCOUNTER — Ambulatory Visit (HOSPITAL_COMMUNITY): Payer: Self-pay | Admitting: Dentistry

## 2013-06-08 ENCOUNTER — Encounter (HOSPITAL_COMMUNITY): Payer: Self-pay | Admitting: Dentistry

## 2013-06-08 VITALS — BP 123/78 | HR 105 | Temp 98.8°F

## 2013-06-08 DIAGNOSIS — K029 Dental caries, unspecified: Secondary | ICD-10-CM

## 2013-06-08 DIAGNOSIS — M264 Malocclusion, unspecified: Secondary | ICD-10-CM

## 2013-06-08 DIAGNOSIS — K08409 Partial loss of teeth, unspecified cause, unspecified class: Secondary | ICD-10-CM

## 2013-06-08 DIAGNOSIS — K0530 Chronic periodontitis, unspecified: Secondary | ICD-10-CM

## 2013-06-08 DIAGNOSIS — K036 Deposits [accretions] on teeth: Secondary | ICD-10-CM

## 2013-06-08 NOTE — Patient Instructions (Addendum)
Patient to continue to use salt water rinses as needed to aid healing. Patient was instructed to followup with a primary dentist of her choice for exam, dental x-rays, and treatment plan and discussion to include dental restorations, periodontal therapy, and evaluation for replacement of missing teeth. Patient to call if acute problems arise with existing healing extraction sites.  Charlynne Pander, DDS

## 2013-06-08 NOTE — Progress Notes (Addendum)
POST OPERATIVE NOTE:  06/08/2013 Whitney Wagner [id number]  VITALS: BP 123/78  Pulse 105  Temp(Src) 98.8 F (37.1 C) (Oral)  LMP 05/08/2013  Patient is status post extraction of tooth numbers 5, 12, and 30 with alveoloplasty on 06/01/2013. The patient now presents for evaluation of healing and suture removal as indicated.  SUBJECTIVE: The patient currently denies acute toothache, swellings, or abscesses. Patient with some tenderness to the right mandibular area, but this may be possible tenderness from her temporal mandibular joint dysfunction. Patient is using her ibuprofen 800 mg as directed with good results and good pain relief.  EXAM: No sign of infection, heme, or ooze. Sutures are loosely intact in the mandibular right surgical site. Extraction site of tooth numbers 5 and 12 are healing in by secondary intention. The mandibular right surgical site is healing in with generalized primary closure.  The patient still has multiple dental caries, and multiple missing teeth and need for evaluation by a new primary dentist for an exam, continue periodontal therapy, and evaluation for replacement of missing teeth.  Procedure: 30 second chlorhexidine gluconate rinse. Sutures removed without complications.  ASSESSMENT: Post operative course is consistent with dental procedures performed on 06/01/2013.   PLAN: 1. Continue salt water rinses as needed to aid healing. 2. Schedule appointment with general dentist for an exam, dental x-rays, and treatment planned discussion to include periodontal therapy and replacement of missing teeth as indicated. 3. Call if problems arise with healing from her current dental extraction sites.  Charlynne Pander, DDS

## 2013-07-19 ENCOUNTER — Encounter (HOSPITAL_COMMUNITY): Payer: Self-pay | Admitting: *Deleted

## 2013-07-20 ENCOUNTER — Ambulatory Visit (INDEPENDENT_AMBULATORY_CARE_PROVIDER_SITE_OTHER): Payer: BC Managed Care – PPO | Admitting: Obstetrics & Gynecology

## 2013-07-20 ENCOUNTER — Encounter: Payer: Self-pay | Admitting: Obstetrics & Gynecology

## 2013-07-20 VITALS — BP 125/87 | HR 90 | Ht 62.0 in | Wt 264.5 lb

## 2013-07-20 DIAGNOSIS — D219 Benign neoplasm of connective and other soft tissue, unspecified: Secondary | ICD-10-CM

## 2013-07-20 DIAGNOSIS — D259 Leiomyoma of uterus, unspecified: Secondary | ICD-10-CM

## 2013-07-20 NOTE — Progress Notes (Signed)
  Subjective:    Patient ID: Whitney Wagner, female    DOB: Jan 03, 1976, 37 y.o.   MRN: [id number]  HPI  37 yo S W G0 who is here for her pre op appt. She is scheduled for a RATH/bilateral salpingectomy on 08-01-13 with Dr. Erin Fulling.  She understands the risks of surgery, including, but not to infection, bleeding, DVTs, damage to bowel, bladder, ureters. She wishes to proceed. She is in a same sex relationship and reports dyspareunia with intercourse (with a dildo) for a few years. She is able to achieve orgasm. She reports only rare occasions of GSUI.    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  Her pap smear was normal 6/14. Her periods are heavy and she has 2 occasions of bleeding per month.    Objective:   Physical Exam        Assessment & Plan:  Fibroids and pelvic pain. She is scheduled for RATH/bl salpingectomy. I have recommended that she drink a small bottle of Miralax the night before surgery. All questions were answered.

## 2013-07-27 ENCOUNTER — Encounter (HOSPITAL_COMMUNITY): Payer: Self-pay | Admitting: Pharmacist

## 2013-07-30 ENCOUNTER — Encounter (HOSPITAL_COMMUNITY): Payer: Self-pay | Admitting: Anesthesiology

## 2013-07-30 NOTE — Anesthesia Preprocedure Evaluation (Addendum)
Anesthesia Evaluation  Patient identified by MRN, date of birth, ID band Patient awake    Reviewed: Allergy & Precautions, H&P , NPO status   Airway Mallampati: III TM Distance: >3 FB Neck ROM: Full    Dental no notable dental hx. (+) Teeth Intact and Poor Dentition   Pulmonary former smoker,  Recent smoking cessation 7/14 Snores breath sounds clear to auscultation  Pulmonary exam normal       Cardiovascular negative cardio ROS  Rhythm:Regular Rate:Normal     Neuro/Psych  Headaches, Depression    GI/Hepatic Neg liver ROS, GERD-  Medicated and Controlled,  Endo/Other  Morbid obesity  Renal/GU negative Renal ROS     Musculoskeletal  (+) Arthritis -, Osteoarthritis,    Abdominal (+) + obese,   Peds  Hematology   Anesthesia Other Findings   Reproductive/Obstetrics Menorrhagia Uterine Fibroids PCOS Pelvic Pain                          Anesthesia Physical Anesthesia Plan  ASA: III  Anesthesia Plan: General   Post-op Pain Management:    Induction: Intravenous  Airway Management Planned: Oral ETT  Additional Equipment:   Intra-op Plan:   Post-operative Plan: Extubation in OR  Informed Consent: I have reviewed the patients History and Physical, chart, labs and discussed the procedure including the risks, benefits and alternatives for the proposed anesthesia with the patient or authorized representative who has indicated his/her understanding and acceptance.   Dental advisory given  Plan Discussed with: Anesthesiologist, Surgeon and CRNA  Anesthesia Plan Comments:         Anesthesia Quick Evaluation

## 2013-07-31 MED ORDER — DEXTROSE 5 % IV SOLN
3.0000 g | INTRAVENOUS | Status: DC
  Filled 2013-07-31: qty 3000

## 2013-08-01 ENCOUNTER — Encounter (HOSPITAL_COMMUNITY): Payer: Self-pay | Admitting: Anesthesiology

## 2013-08-01 ENCOUNTER — Encounter (HOSPITAL_COMMUNITY): Payer: Self-pay

## 2013-08-01 ENCOUNTER — Inpatient Hospital Stay (HOSPITAL_COMMUNITY)
Admission: AD | Admit: 2013-08-01 | Discharge: 2013-08-03 | Disposition: A | Discharge status: Home / Self Care | Payer: BC Managed Care – PPO | Source: Ambulatory Visit | Attending: Internal Medicine | Admitting: Internal Medicine

## 2013-08-01 ENCOUNTER — Ambulatory Visit (HOSPITAL_COMMUNITY): Payer: BC Managed Care – PPO

## 2013-08-01 ENCOUNTER — Inpatient Hospital Stay (HOSPITAL_COMMUNITY)
Admission: RE | Admit: 2013-08-01 | Discharge: 2013-08-01 | DRG: 572 | Discharge status: Against Medical Advice | Payer: BC Managed Care – PPO | Source: Ambulatory Visit | Attending: Obstetrics & Gynecology | Admitting: Obstetrics & Gynecology

## 2013-08-01 ENCOUNTER — Ambulatory Visit (HOSPITAL_COMMUNITY): Payer: BC Managed Care – PPO | Admitting: Anesthesiology

## 2013-08-01 ENCOUNTER — Encounter (HOSPITAL_COMMUNITY)
Admission: RE | Disposition: A | Discharge status: Still In Hospital | Payer: Self-pay | Source: Ambulatory Visit | Attending: Obstetrics & Gynecology

## 2013-08-01 ENCOUNTER — Inpatient Hospital Stay (HOSPITAL_COMMUNITY): Payer: BC Managed Care – PPO

## 2013-08-01 DIAGNOSIS — N938 Other specified abnormal uterine and vaginal bleeding: Secondary | ICD-10-CM

## 2013-08-01 DIAGNOSIS — Z8674 Personal history of sudden cardiac arrest: Secondary | ICD-10-CM | POA: Diagnosis not present

## 2013-08-01 DIAGNOSIS — I498 Other specified cardiac arrhythmias: Secondary | ICD-10-CM | POA: Diagnosis present

## 2013-08-01 DIAGNOSIS — I469 Cardiac arrest, cause unspecified: Secondary | ICD-10-CM

## 2013-08-01 DIAGNOSIS — R578 Other shock: Secondary | ICD-10-CM

## 2013-08-01 DIAGNOSIS — D259 Leiomyoma of uterus, unspecified: Principal | ICD-10-CM | POA: Diagnosis present

## 2013-08-01 DIAGNOSIS — J9600 Acute respiratory failure, unspecified whether with hypoxia or hypercapnia: Secondary | ICD-10-CM | POA: Diagnosis not present

## 2013-08-01 DIAGNOSIS — F17200 Nicotine dependence, unspecified, uncomplicated: Secondary | ICD-10-CM | POA: Diagnosis present

## 2013-08-01 DIAGNOSIS — Z720 Tobacco use: Secondary | ICD-10-CM

## 2013-08-01 DIAGNOSIS — D649 Anemia, unspecified: Secondary | ICD-10-CM | POA: Diagnosis present

## 2013-08-01 DIAGNOSIS — Y838 Other surgical procedures as the cause of abnormal reaction of the patient, or of later complication, without mention of misadventure at the time of the procedure: Secondary | ICD-10-CM | POA: Diagnosis not present

## 2013-08-01 DIAGNOSIS — Y9210 Unspecified residential institution as the place of occurrence of the external cause: Secondary | ICD-10-CM | POA: Diagnosis not present

## 2013-08-01 DIAGNOSIS — Z6841 Body Mass Index (BMI) 40.0 and over, adult: Secondary | ICD-10-CM

## 2013-08-01 DIAGNOSIS — E282 Polycystic ovarian syndrome: Secondary | ICD-10-CM | POA: Diagnosis present

## 2013-08-01 DIAGNOSIS — I959 Hypotension, unspecified: Secondary | ICD-10-CM | POA: Diagnosis present

## 2013-08-01 DIAGNOSIS — IMO0002 Reserved for concepts with insufficient information to code with codable children: Secondary | ICD-10-CM | POA: Diagnosis not present

## 2013-08-01 DIAGNOSIS — G8918 Other acute postprocedural pain: Secondary | ICD-10-CM

## 2013-08-01 DIAGNOSIS — K219 Gastro-esophageal reflux disease without esophagitis: Secondary | ICD-10-CM | POA: Diagnosis present

## 2013-08-01 DIAGNOSIS — K661 Hemoperitoneum: Secondary | ICD-10-CM | POA: Diagnosis present

## 2013-08-01 DIAGNOSIS — N920 Excessive and frequent menstruation with regular cycle: Secondary | ICD-10-CM | POA: Diagnosis present

## 2013-08-01 HISTORY — PX: ROBOTIC ASSISTED TOTAL HYSTERECTOMY: SHX6085

## 2013-08-01 HISTORY — DX: Other seasonal allergic rhinitis: J30.2

## 2013-08-01 LAB — COMPREHENSIVE METABOLIC PANEL
ALT: 260 U/L — ABNORMAL HIGH (ref 0–35)
Alkaline Phosphatase: 61 U/L (ref 39–117)
BUN: 10 mg/dL (ref 6–23)
CO2: 18 mEq/L — ABNORMAL LOW (ref 19–32)
CO2: 20 mEq/L (ref 19–32)
Calcium: 8 mg/dL — ABNORMAL LOW (ref 8.4–10.5)
Chloride: 100 mEq/L (ref 96–112)
Creatinine, Ser: 0.68 mg/dL (ref 0.50–1.10)
GFR calc Af Amer: >90 mL/min (ref >90)
GFR calc Af Amer: >90 mL/min (ref >90)
GFR calc non Af Amer: 86 mL/min — ABNORMAL LOW (ref >90)
GFR calc non Af Amer: >90 mL/min (ref >90)
Glucose, Bld: 368 mg/dL — ABNORMAL HIGH (ref 70–99)
Glucose, Bld: 197 mg/dL — ABNORMAL HIGH (ref 70–99)
Potassium: 4.2 mEq/L (ref 3.5–5.1)
Sodium: 132 mEq/L — ABNORMAL LOW (ref 135–145)
Total Bilirubin: 0.1 mg/dL — ABNORMAL LOW (ref 0.3–1.2)
Total Protein: 5.2 g/dL — ABNORMAL LOW (ref 6.0–8.3)

## 2013-08-01 LAB — CBC
Hemoglobin: 10.1 g/dL — ABNORMAL LOW (ref 12.0–15.0)
MCH: 30.4 pg (ref 26.0–34.0)
MCHC: 35.4 g/dL (ref 30.0–36.0)
MCV: 85.2 fL (ref 78.0–100.0)
MCV: 85.9 fL (ref 78.0–100.0)
MCV: 84.9 fL (ref 78.0–100.0)
Platelets: 292 10*3/uL (ref 150–400)
Platelets: 353 10*3/uL (ref 150–400)
Platelets: 373 10*3/uL (ref 150–400)
RBC: 5 MIL/uL (ref 3.87–5.11)
RBC: 3.39 MIL/uL — ABNORMAL LOW (ref 3.87–5.11)
RBC: 3.26 MIL/uL — ABNORMAL LOW (ref 3.87–5.11)
RBC: 3.25 MIL/uL — ABNORMAL LOW (ref 3.87–5.11)
RDW: 12.6 % (ref 11.5–15.5)
RDW: 12.7 % (ref 11.5–15.5)
WBC: 9.4 10*3/uL (ref 4.0–10.5)
WBC: 22 10*3/uL — ABNORMAL HIGH (ref 4.0–10.5)

## 2013-08-01 LAB — GLUCOSE, CAPILLARY
Glucose-Capillary: 255 mg/dL — ABNORMAL HIGH (ref 70–99)
Glucose-Capillary: 174 mg/dL — ABNORMAL HIGH (ref 70–99)
Glucose-Capillary: 175 mg/dL — ABNORMAL HIGH (ref 70–99)

## 2013-08-01 LAB — ABO/RH: ABO/RH(D): B POS

## 2013-08-01 LAB — TROPONIN I
Troponin I: <0.3 ng/mL (ref <0.30)
Troponin I: <0.3 ng/mL (ref <0.30)

## 2013-08-01 LAB — CK TOTAL AND CKMB (NOT AT ARMC)
CK, MB: 1.7 ng/mL (ref 0.3–4.0)
Relative Index: INVALID (ref 0.0–2.5)

## 2013-08-01 LAB — LACTIC ACID, PLASMA
Lactic Acid, Venous: 4 mmol/L — ABNORMAL HIGH (ref 0.5–2.2)
Lactic Acid, Venous: 3.3 mmol/L — ABNORMAL HIGH (ref 0.5–2.2)

## 2013-08-01 LAB — TYPE AND SCREEN
ABO/RH(D): B POS
Antibody Screen: NEGATIVE

## 2013-08-01 LAB — D-DIMER, QUANTITATIVE: D-Dimer, Quant: 1.53 ug/mL-FEU — ABNORMAL HIGH (ref 0.00–0.48)

## 2013-08-01 LAB — PREGNANCY, URINE: Preg Test, Ur: NEGATIVE

## 2013-08-01 LAB — HEMOGLOBIN A1C: Hgb A1c MFr Bld: 5.4 % (ref <5.7)

## 2013-08-01 LAB — MAGNESIUM: Magnesium: 1.7 mg/dL (ref 1.5–2.5)

## 2013-08-01 SURGERY — ROBOTIC ASSISTED TOTAL HYSTERECTOMY
Anesthesia: General | Site: Abdomen | Wound class: Clean Contaminated

## 2013-08-01 MED ORDER — INSULIN ASPART 100 UNIT/ML ~~LOC~~ SOLN
2.0000 [IU] | SUBCUTANEOUS | Status: DC
  Administered 2013-08-01 (×2): 4 [IU] via SUBCUTANEOUS
  Administered 2013-08-02 (×3): 2 [IU] via SUBCUTANEOUS
  Administered 2013-08-02: 4 [IU] via SUBCUTANEOUS
  Administered 2013-08-02: 2 [IU] via SUBCUTANEOUS
  Administered 2013-08-02: 4 [IU] via SUBCUTANEOUS

## 2013-08-01 MED ORDER — MORPHINE SULFATE 2 MG/ML IJ SOLN
1.0000 mg | INTRAMUSCULAR | Status: DC | PRN
  Administered 2013-08-01 – 2013-08-02 (×6): 1 mg via INTRAVENOUS
  Filled 2013-08-01 (×6): qty 1

## 2013-08-01 MED ORDER — EPINEPHRINE HCL 1 MG/ML IJ SOLN
INTRAMUSCULAR | Status: DC | PRN
  Administered 2013-08-01: 1 mg via INTRAVENOUS

## 2013-08-01 MED ORDER — SCOPOLAMINE 1 MG/3DAYS TD PT72
MEDICATED_PATCH | TRANSDERMAL | Status: AC
  Administered 2013-08-01: 1.5 mg via TRANSDERMAL
  Filled 2013-08-01: qty 1

## 2013-08-01 MED ORDER — PHENYLEPHRINE HCL 10 MG/ML IJ SOLN
30.0000 ug/min | INTRAVENOUS | Status: DC
  Filled 2013-08-01: qty 1

## 2013-08-01 MED ORDER — LACTATED RINGERS IV SOLN
INTRAVENOUS | Status: DC
  Administered 2013-08-01 (×3): via INTRAVENOUS

## 2013-08-01 MED ORDER — MIDAZOLAM HCL 2 MG/2ML IJ SOLN
INTRAMUSCULAR | Status: AC
  Filled 2013-08-01: qty 2

## 2013-08-01 MED ORDER — PHENYLEPHRINE HCL 10 MG/ML IJ SOLN
20.0000 mg | INTRAVENOUS | Status: DC | PRN
  Administered 2013-08-01: 100 ug/min via INTRAVENOUS

## 2013-08-01 MED ORDER — PHENYLEPHRINE 40 MCG/ML (10ML) SYRINGE FOR IV PUSH (FOR BLOOD PRESSURE SUPPORT)
PREFILLED_SYRINGE | INTRAVENOUS | Status: AC
  Filled 2013-08-01: qty 5

## 2013-08-01 MED ORDER — INSULIN ASPART 100 UNIT/ML ~~LOC~~ SOLN
2.0000 [IU] | Freq: Once | SUBCUTANEOUS | Status: AC
  Administered 2013-08-01: 2 [IU] via SUBCUTANEOUS

## 2013-08-01 MED ORDER — SODIUM CHLORIDE 0.9 % IV SOLN: INTRAVENOUS | Status: DC

## 2013-08-01 MED ORDER — GLYCOPYRROLATE 0.2 MG/ML IJ SOLN
INTRAMUSCULAR | Status: DC | PRN
  Administered 2013-08-01: 0.4 mg via INTRAVENOUS

## 2013-08-01 MED ORDER — SUCCINYLCHOLINE CHLORIDE 20 MG/ML IJ SOLN
INTRAMUSCULAR | Status: AC
  Filled 2013-08-01: qty 10

## 2013-08-01 MED ORDER — PHENYLEPHRINE HCL 10 MG/ML IJ SOLN
30.0000 ug/min | INTRAMUSCULAR | Status: DC
  Administered 2013-08-01: 30 ug/min via INTRAVENOUS
  Administered 2013-08-01: 45 ug/min via INTRAVENOUS
  Filled 2013-08-01 (×2): qty 1

## 2013-08-01 MED ORDER — PHENYLEPHRINE 40 MCG/ML (10ML) SYRINGE FOR IV PUSH (FOR BLOOD PRESSURE SUPPORT)
PREFILLED_SYRINGE | INTRAVENOUS | Status: AC
  Filled 2013-08-01: qty 15

## 2013-08-01 MED ORDER — SODIUM CHLORIDE 0.9 % IV SOLN: 250.0000 mL | INTRAVENOUS | Status: DC | PRN

## 2013-08-01 MED ORDER — SODIUM CHLORIDE 0.9 % IV SOLN
INTRAVENOUS | Status: DC
  Administered 2013-08-01: 100 mL/h via INTRAVENOUS
  Administered 2013-08-03: 05:00:00 via INTRAVENOUS

## 2013-08-01 MED ORDER — DEXAMETHASONE SODIUM PHOSPHATE 10 MG/ML IJ SOLN
INTRAMUSCULAR | Status: AC
  Filled 2013-08-01: qty 1

## 2013-08-01 MED ORDER — MUPIROCIN 2 % EX OINT: TOPICAL_OINTMENT | Freq: Once | CUTANEOUS | Status: AC

## 2013-08-01 MED ORDER — DEXAMETHASONE SODIUM PHOSPHATE 10 MG/ML IJ SOLN
INTRAMUSCULAR | Status: DC | PRN
  Administered 2013-08-01 (×2): 10 mg via INTRAVENOUS

## 2013-08-01 MED ORDER — MEPERIDINE HCL 25 MG/ML IJ SOLN: 6.2500 mg | INTRAMUSCULAR | Status: DC | PRN

## 2013-08-01 MED ORDER — FENTANYL CITRATE 0.05 MG/ML IJ SOLN
INTRAMUSCULAR | Status: AC
  Filled 2013-08-01: qty 5

## 2013-08-01 MED ORDER — INSULIN ASPART 100 UNIT/ML ~~LOC~~ SOLN: 2.0000 [IU] | SUBCUTANEOUS | Status: DC

## 2013-08-01 MED ORDER — ROPIVACAINE HCL 5 MG/ML IJ SOLN
INTRAMUSCULAR | Status: AC
  Filled 2013-08-01: qty 30

## 2013-08-01 MED ORDER — LIDOCAINE HCL (CARDIAC) 20 MG/ML IV SOLN
INTRAVENOUS | Status: DC | PRN
  Administered 2013-08-01: 100 mg via INTRAVENOUS

## 2013-08-01 MED ORDER — PHENYLEPHRINE HCL 10 MG/ML IJ SOLN
INTRAMUSCULAR | Status: DC | PRN
  Administered 2013-08-01 (×9): 80 ug via INTRAVENOUS

## 2013-08-01 MED ORDER — LACTATED RINGERS IR SOLN
Status: DC | PRN
  Administered 2013-08-01: 3000 mL

## 2013-08-01 MED ORDER — SUCCINYLCHOLINE CHLORIDE 20 MG/ML IJ SOLN
INTRAMUSCULAR | Status: DC | PRN
  Administered 2013-08-01: 100 mg via INTRAVENOUS

## 2013-08-01 MED ORDER — PANTOPRAZOLE SODIUM 40 MG PO TBEC
DELAYED_RELEASE_TABLET | ORAL | Status: AC
  Administered 2013-08-01: 40 mg via ORAL
  Filled 2013-08-01: qty 1

## 2013-08-01 MED ORDER — FAMOTIDINE IN NACL 20-0.9 MG/50ML-% IV SOLN
20.0000 mg | Freq: Two times a day (BID) | INTRAVENOUS | Status: DC
  Administered 2013-08-01 (×2): 20 mg via INTRAVENOUS
  Filled 2013-08-01 (×4): qty 50

## 2013-08-01 MED ORDER — FAMOTIDINE IN NACL 20-0.9 MG/50ML-% IV SOLN: 20.0000 mg | Freq: Two times a day (BID) | INTRAVENOUS | Status: DC

## 2013-08-01 MED ORDER — PHENYLEPHRINE HCL 10 MG/ML IJ SOLN
INTRAMUSCULAR | Status: AC
  Filled 2013-08-01: qty 1

## 2013-08-01 MED ORDER — BUPIVACAINE HCL (PF) 0.5 % IJ SOLN
INTRAMUSCULAR | Status: AC
  Filled 2013-08-01: qty 30

## 2013-08-01 MED ORDER — SCOPOLAMINE 1 MG/3DAYS TD PT72: 1.0000 | MEDICATED_PATCH | TRANSDERMAL | Status: DC

## 2013-08-01 MED ORDER — FENTANYL CITRATE 0.05 MG/ML IJ SOLN: 25.0000 ug | INTRAMUSCULAR | Status: DC | PRN

## 2013-08-01 MED ORDER — MIDAZOLAM HCL 5 MG/5ML IJ SOLN
INTRAMUSCULAR | Status: DC | PRN
  Administered 2013-08-01: 2 mg via INTRAVENOUS
  Administered 2013-08-01 (×2): 1 mg via INTRAVENOUS

## 2013-08-01 MED ORDER — ATROPINE SULFATE 0.4 MG/ML IJ SOLN
INTRAMUSCULAR | Status: DC | PRN
  Administered 2013-08-01: 0.4 mg via INTRAVENOUS

## 2013-08-01 MED ORDER — ONDANSETRON HCL 4 MG/2ML IJ SOLN
INTRAMUSCULAR | Status: DC | PRN
  Administered 2013-08-01: 4 mg via INTRAVENOUS

## 2013-08-01 MED ORDER — NEOSTIGMINE METHYLSULFATE 1 MG/ML IJ SOLN
INTRAMUSCULAR | Status: DC | PRN
  Administered 2013-08-01: 3 mg via INTRAVENOUS

## 2013-08-01 MED ORDER — BUPIVACAINE HCL (PF) 0.5 % IJ SOLN
INTRAMUSCULAR | Status: DC | PRN
  Administered 2013-08-01: 30 mL

## 2013-08-01 MED ORDER — PROPOFOL 10 MG/ML IV EMUL
INTRAVENOUS | Status: AC
  Filled 2013-08-01: qty 20

## 2013-08-01 MED ORDER — MUPIROCIN 2 % EX OINT
TOPICAL_OINTMENT | CUTANEOUS | Status: AC
  Administered 2013-08-01: 1 via NASAL
  Filled 2013-08-01: qty 22

## 2013-08-01 MED ORDER — EPINEPHRINE HCL 0.1 MG/ML IJ SOSY
PREFILLED_SYRINGE | INTRAMUSCULAR | Status: AC
  Filled 2013-08-01: qty 10

## 2013-08-01 MED ORDER — ROCURONIUM BROMIDE 100 MG/10ML IV SOLN
INTRAVENOUS | Status: DC | PRN
  Administered 2013-08-01: 50 mg via INTRAVENOUS

## 2013-08-01 MED ORDER — METOCLOPRAMIDE HCL 5 MG/ML IJ SOLN: 10.0000 mg | Freq: Once | INTRAMUSCULAR | Status: DC | PRN

## 2013-08-01 MED ORDER — OXYCODONE-ACETAMINOPHEN 5-325 MG PO TABS
1.0000 | ORAL_TABLET | Freq: Four times a day (QID) | ORAL | Status: DC | PRN
  Administered 2013-08-01 – 2013-08-02 (×3): 2 via ORAL
  Administered 2013-08-02 (×2): 1 via ORAL
  Administered 2013-08-03 (×2): 2 via ORAL
  Filled 2013-08-01 (×2): qty 2
  Filled 2013-08-01: qty 1
  Filled 2013-08-01 (×2): qty 2
  Filled 2013-08-01: qty 1
  Filled 2013-08-01: qty 2

## 2013-08-01 MED ORDER — LIDOCAINE HCL (CARDIAC) 20 MG/ML IV SOLN
INTRAVENOUS | Status: AC
  Filled 2013-08-01: qty 5

## 2013-08-01 MED ORDER — LACTATED RINGERS IV SOLN
INTRAVENOUS | Status: DC
  Administered 2013-08-01: 125 mL/h via INTRAVENOUS
  Administered 2013-08-01 (×2): via INTRAVENOUS

## 2013-08-01 MED ORDER — DEXTROSE 5 % IV SOLN
3.0000 g | INTRAVENOUS | Status: DC | PRN
  Administered 2013-08-01: 3 g via INTRAVENOUS

## 2013-08-01 MED ORDER — PROPOFOL 10 MG/ML IV BOLUS
INTRAVENOUS | Status: DC | PRN
  Administered 2013-08-01: 200 mg via INTRAVENOUS

## 2013-08-01 MED ORDER — PHENYLEPHRINE HCL 10 MG/ML IJ SOLN
30.0000 ug/min | INTRAVENOUS | Status: DC
  Administered 2013-08-01: 150 ug/min via INTRAVENOUS
  Filled 2013-08-01: qty 2

## 2013-08-01 MED ORDER — FENTANYL CITRATE 0.05 MG/ML IJ SOLN
INTRAMUSCULAR | Status: DC | PRN
  Administered 2013-08-01: 25 ug via INTRAVENOUS
  Administered 2013-08-01: 50 ug via INTRAVENOUS
  Administered 2013-08-01 (×2): 25 ug via INTRAVENOUS
  Administered 2013-08-01 (×2): 50 ug via INTRAVENOUS
  Administered 2013-08-01: 25 ug via INTRAVENOUS

## 2013-08-01 MED ORDER — ROCURONIUM BROMIDE 50 MG/5ML IV SOLN
INTRAVENOUS | Status: AC
  Filled 2013-08-01: qty 1

## 2013-08-01 MED ORDER — PANTOPRAZOLE SODIUM 40 MG PO TBEC: 40.0000 mg | DELAYED_RELEASE_TABLET | Freq: Once | ORAL | Status: AC

## 2013-08-01 MED ORDER — ONDANSETRON HCL 4 MG/2ML IJ SOLN
INTRAMUSCULAR | Status: AC
  Filled 2013-08-01: qty 2

## 2013-08-01 SURGICAL SUPPLY — 66 items
ADH SKN CLS APL DERMABOND .7 (GAUZE/BANDAGES/DRESSINGS) ×1
APL SKNCLS STERI-STRIP NONHPOA (GAUZE/BANDAGES/DRESSINGS) ×1
BAG URINE DRAINAGE (UROLOGICAL SUPPLIES) ×2 IMPLANT
BARRIER ADHS 3X4 INTERCEED (GAUZE/BANDAGES/DRESSINGS) IMPLANT
BENZOIN TINCTURE PRP APPL 2/3 (GAUZE/BANDAGES/DRESSINGS) ×2 IMPLANT
BRR ADH 4X3 ABS CNTRL BYND (GAUZE/BANDAGES/DRESSINGS)
CATH FOLEY 3WAY  5CC 16FR (CATHETERS) ×1
CATH FOLEY 3WAY 5CC 16FR (CATHETERS) ×1 IMPLANT
CLOTH BEACON ORANGE TIMEOUT ST (SAFETY) ×2 IMPLANT
CONT PATH 16OZ SNAP LID 3702 (MISCELLANEOUS) ×2 IMPLANT
COVER MAYO STAND STRL (DRAPES) ×2 IMPLANT
COVER TABLE BACK 60X90 (DRAPES) ×4 IMPLANT
COVER TIP SHEARS 8 DVNC (MISCELLANEOUS) ×1 IMPLANT
COVER TIP SHEARS 8MM DA VINCI (MISCELLANEOUS) ×1
DECANTER SPIKE VIAL GLASS SM (MISCELLANEOUS) ×2 IMPLANT
DERMABOND ADVANCED (GAUZE/BANDAGES/DRESSINGS) ×1
DERMABOND ADVANCED .7 DNX12 (GAUZE/BANDAGES/DRESSINGS) ×1 IMPLANT
DEVICE TROCAR PUNCTURE CLOSURE (ENDOMECHANICALS) IMPLANT
DRAPE HUG U DISPOSABLE (DRAPE) ×2 IMPLANT
DRAPE LG THREE QUARTER DISP (DRAPES) ×4 IMPLANT
DRAPE WARM FLUID 44X44 (DRAPE) ×2 IMPLANT
ELECT REM PT RETURN 9FT ADLT (ELECTROSURGICAL) ×2
ELECTRODE REM PT RTRN 9FT ADLT (ELECTROSURGICAL) ×1 IMPLANT
EVACUATOR SMOKE 8.L (FILTER) ×2 IMPLANT
GAUZE VASELINE 3X9 (GAUZE/BANDAGES/DRESSINGS) IMPLANT
GLOVE BIO SURGEON STRL SZ7 (GLOVE) ×4 IMPLANT
GLOVE BIOGEL PI IND STRL 7.0 (GLOVE) ×2 IMPLANT
GLOVE BIOGEL PI INDICATOR 7.0 (GLOVE) ×2
GOWN STRL REIN XL XLG (GOWN DISPOSABLE) ×12 IMPLANT
KIT ACCESSORY DA VINCI DISP (KITS) ×1
KIT ACCESSORY DVNC DISP (KITS) ×1 IMPLANT
LEGGING LITHOTOMY PAIR STRL (DRAPES) ×2 IMPLANT
NEEDLE INSUFFLATION 120MM (ENDOMECHANICALS) ×2 IMPLANT
OCCLUDER COLPOPNEUMO (BALLOONS) ×2 IMPLANT
PACK LAVH (CUSTOM PROCEDURE TRAY) ×2 IMPLANT
PAD PREP 24X48 CUFFED NSTRL (MISCELLANEOUS) ×4 IMPLANT
PLUG CATH AND CAP STER (CATHETERS) ×2 IMPLANT
PROTECTOR NERVE ULNAR (MISCELLANEOUS) ×4 IMPLANT
SET CYSTO W/LG BORE CLAMP LF (SET/KITS/TRAYS/PACK) IMPLANT
SET IRRIG TUBING LAPAROSCOPIC (IRRIGATION / IRRIGATOR) ×2 IMPLANT
SOLUTION ELECTROLUBE (MISCELLANEOUS) ×2 IMPLANT
STRIP CLOSURE SKIN 1/2X4 (GAUZE/BANDAGES/DRESSINGS) ×2 IMPLANT
SURGIFLO W/THROMBIN 8M KIT (HEMOSTASIS) IMPLANT
SUT MNCRL AB 4-0 PS2 18 (SUTURE) ×4 IMPLANT
SUT VIC AB 0 CT1 27 (SUTURE) ×4
SUT VIC AB 0 CT1 27XBRD ANBCTR (SUTURE) ×2 IMPLANT
SUT VIC AB 0 CT1 27XBRD ANTBC (SUTURE) IMPLANT
SUT VICRYL 0 UR6 27IN ABS (SUTURE) ×4 IMPLANT
SUT VLOC 180 0 9IN  GS21 (SUTURE)
SUT VLOC 180 0 9IN GS21 (SUTURE) IMPLANT
SYR 50ML LL SCALE MARK (SYRINGE) ×2 IMPLANT
SYSTEM CONVERTIBLE TROCAR (TROCAR) IMPLANT
TIP RUMI ORANGE 6.7MMX12CM (TIP) IMPLANT
TIP UTERINE 5.1X6CM LAV DISP (MISCELLANEOUS) IMPLANT
TIP UTERINE 6.7X10CM GRN DISP (MISCELLANEOUS) IMPLANT
TIP UTERINE 6.7X6CM WHT DISP (MISCELLANEOUS) IMPLANT
TIP UTERINE 6.7X8CM BLUE DISP (MISCELLANEOUS) IMPLANT
TOWEL OR 17X24 6PK STRL BLUE (TOWEL DISPOSABLE) ×4 IMPLANT
TROCAR 12M 150ML BLUNT (TROCAR) ×2 IMPLANT
TROCAR DILATING TIP 12MM 150MM (ENDOMECHANICALS) ×2 IMPLANT
TROCAR DISP BLADELESS 8 DVNC (TROCAR) ×2 IMPLANT
TROCAR DISP BLADELESS 8MM (TROCAR) ×2
TROCAR XCEL 12X100 BLDLESS (ENDOMECHANICALS) IMPLANT
TROCAR XCEL NON-BLD 5MMX100MML (ENDOMECHANICALS) ×2 IMPLANT
TUBING FILTER THERMOFLATOR (ELECTROSURGICAL) ×2 IMPLANT
WATER STERILE IRR 1000ML POUR (IV SOLUTION) ×6 IMPLANT

## 2013-08-01 NOTE — Transfer of Care (Signed)
Immediate Anesthesia Transfer of Care Note  Patient: Whitney Wagner  Procedure(s) Performed: Procedure(s): ROBOTIC ASSISTED TOTAL HYSTERECTOMY (N/A)  Patient Location: PACU  Anesthesia Type:General  Level of Consciousness: awake, alert  and oriented  Airway & Oxygen Therapy: Patient Spontanous Breathing and Patient connected to face mask oxygen  Post-op Assessment: Report given to PACU RN, Post -op Vital signs reviewed and stable and Patient moving all extremities  Post vital signs: Reviewed and stable  Complications: No apparent anesthesia complications

## 2013-08-01 NOTE — Progress Notes (Signed)
[facility] here to take over care of patient.  Additional report given.

## 2013-08-01 NOTE — Op Note (Signed)
08/01/2013  10:41 AM  PATIENT:  Whitney Wagner  37 y.o. female  PRE-OPERATIVE DIAGNOSIS:  cpt [phone] - Pelvic pain, symptomatice uterine fibroids most likely the cause of her menorrhagia  POST-OPERATIVE DIAGNOSIS:  cpt #81191 - Pelvic pain, symptomatice uterine fibroids  most likely the cause of herPEA  of  unknown  etiology menorrhagia  PROCEDURE:  Procedure(s): ROBOTIC ASSISTED TOTAL HYSTERECTOMY (N/A)- PROCEDURE NOT PERFORMED  SURGEON:  Surgeon(s) and Role:    * Willodean Rosenthal, MD - Primary    * Allie Bossier, MD - Assisting   ANESTHESIA:   general  EBL:  Total I/O In: 2000 [I.V.:2000] Out: 10 [Blood:10]  BLOOD ADMINISTERED:none  DRAINS: none   LOCAL MEDICATIONS USED:  MARCAINE     SPECIMEN:  No Specimen  DISPOSITION OF SPECIMEN:  N/A  COUNTS:  YES  TOURNIQUET:  * No tourniquets in log *  DICTATION: .Note written in EPIC  PLAN OF CARE: Transferred to [facility] ICU   PATIENT DISPOSITION:  PACU - guarded condition.   Delay start of Pharmacological VTE agent (>24hrs) due to surgical blood loss or risk of bleeding: yes   The risks, benefits, and alternatives of surgery were explained, understood, and accepted. Consents were signed. All questions were answered. She was taken to the operating room and general anesthesia was applied without complication. She was placed in the dorsal lithotomy position and her abdomen and vagina were prepped and draped after she had been carefully positioned on the table. A bimanual exam revealed a 12 week sized uterus that was mobile. Her adnexa were difficult to palpate due to the patients body habitus. The cervix was measured and the uterus was sounded to 8 cm. A Rumi uterine manipulator was placed without difficulty. A Foley catheter was placed in the patients bladder. Gloves were changed and attention was turned to the abdomen. A small incision was made in the umbilicus and a Veress needle was placed intraperitoneally.  Confirmation of placement was made with use of a water-filled syringe.  CO2 was used to insufflate the abdomen to approximately 5 L. After good pneumoperitoneum was established, a 12 mm trocar was placed approximately 10cm above the umbilicus. A 5mm asistannt port was placed in the LUQ.  Laparoscopy confirmed correct placement. She was placed in Trendelenburg position.  At this point anesthesia informed us that pt did not have a pulse.  There was no bleeding noted in the patients abdomen.  The pressure was released from the patients abdomen.  The ports were subsequently removed and the port sites were repaired with Dermabond.  30cc of 0.5% Marcaine was injected into the port sites. The entire report of patients response was dictated by anesthesia. A pelvic sonogram was obtained and no significant hemoperitoneum was noted.    Due to patients cardiac status.  She is being transferred to [facility] ICU.

## 2013-08-01 NOTE — Preoperative (Signed)
Beta Blockers   Reason not to administer Beta Blockers:Not Applicable 

## 2013-08-01 NOTE — Progress Notes (Signed)
Report given to [facility] RN.  En route to [facility] Unit to transport patient.

## 2013-08-01 NOTE — Consult Note (Signed)
CARDIOLOGY CONSULT NOTE   Patient ID: Whitney Wagner MRN: [id number] DOB/AGE: 37-Apr-1977 37 y.o.  Admit date: 08/01/2013 Primary Physician Windell Hummingbird, MD Primary Cardiologist   None Chief Complaint  Questionable cardiac arrest  HPI:  The patient presents for evaluation post reported PEA arrest.  She was admitted to [facility] for TAH for treatement of pelvic pain with symptomatic uterine fibroids and menorrhagia.  Surgery was robotic assisted with general anesthesia.  The procedure was started with unsufflation of the abdoment with 5 L of CO2.  She subsequently was reported to be pulseless.  Pressure was released from the abdomen and the procedure aborted with all hardware removed.  There was no evidence of any surgical trauma.  The patient was reported to have PEA with a heart rate of 130 on the monitor but no BP or pulse.  She was treated during the event with atropine.  She did have chest compressions and was intubated with subsequent spontaneous return of ventilation over the vent settings.  She was subsequently following commands and able to be extubated prior to transfer.    Post event labs included elevated AST/ALT.  Cardiac enzymes x 1 are normal.  EKG apparently after the event demonstrated no acute ST T wave changes.   The patient denies any prior cardiac history.  She has no history of palpitations, presyncope or syncope.  She is active at a job that requires heavy lifting.  With this she has had no recent complaints.  She might get some dyspnea related to tobacco use but she has recently stopped this.    Past Medical History  Diagnosis Date  . PCOS (polycystic ovarian syndrome)   . Osgood-Schlatter's disease of left knee     left knee  . Uterine fibroid   . Arthritis     "beginnings in my hands" (05/26/2013)  . Tooth pain 05/26/2013    "bottom right" (05/26/2013)  . Seasonal allergies   . GERD (gastroesophageal reflux disease)     otc meds prn  . Migraines,  neuralgic     '~ 3/week" (05/26/2013) - otc meds prn  . Morbid obesity     Past Surgical History  Procedure Laterality Date  . External fixation arm Right 1980's  . S/p multiple extractions with alveoloplasty  06/01/2013    Extraction of tooth #'s 5,12,30 with alveoloplasty-Dr. Kristin Bruins  . Wisdom tooth extraction      Allergies  Allergen Reactions  . Codeine Other (See Comments)    Codeine liquid iv  drip only as a child - caused hallucinations..  Can tolerate percocet, vicodin     Prescriptions prior to admission  Medication Sig Dispense Refill  . nicotine (NICODERM CQ - DOSED IN MG/24 HOURS) 21 mg/24hr patch Place 1 patch onto the skin daily.      . [DISCONTINUED] nicotine (NICODERM CQ - DOSED IN MG/24 HOURS) 21 mg/24hr patch Place 1 patch onto the skin daily.       Family History  Problem Relation Age of Onset  . Diabetes Brother   . Hyperlipidemia Brother   . Fibromyalgia Mother   . Cancer Maternal Aunt     breast cancer    History   Social History  . Marital Status: Single    Spouse Name: N/A    Number of Children: N/A  . Years of Education: N/A   Occupational History  . Seamstress    Social History Main Topics  . Smoking status: Former Smoker -- 1.00 packs/day for 21  years    Types: Cigarettes    Quit date: 07/08/2013  . Smokeless tobacco: Former Neurosurgeon    Quit date: 07/01/2013  . Alcohol Use: No  . Drug Use: No  . Sexually Active: Not Currently   Other Topics Concern  . Not on file   Social History Narrative   Lives with partner.      ROS:  As stated in the HPI and negative for all other systems.  Physical Exam: Blood pressure 110/49, pulse 102, temperature 98.4 F (36.9 C), temperature source Oral, resp. rate 28, height 5\' 2"  (1.575 m), weight 270 lb 11.6 oz (122.8 kg), SpO2 100.00%.  GENERAL:  Moderate discomfort secondary surgical incisions and chest compressions.  HEENT:  Pupils equal round and reactive, fundi not visualized, oral mucosa  unremarkable, poor dentition. NECK:  No jugular venous distention, waveform within normal limits, carotid upstroke brisk and symmetric, no bruits, no thyromegaly LYMPHATICS:  No cervical, inguinal adenopathy LUNGS:  Clear to auscultation bilaterally BACK:  No CVA tenderness CHEST:  Unremarkable, diffuse tenderness to palpation.  HEART:  PMI not displaced or sustained,S1 and S2 within normal limits, no S3, no S4, no clicks, no rubs, no murmurs ABD:  Flat, absent bowel sounds , no bruits, no rebound, no guarding, no midline pulsatile mass, no hepatomegaly, no splenomegaly, fresh surgical wounds.  EXT:  2 plus pulses throughout, no edema, no cyanosis no clubbing SKIN:  No rashes no nodules NEURO:  Cranial nerves II through XII grossly intact, motor grossly intact throughout PSYCH:  Cognitively intact, oriented to person place and time  Labs: Lab Results  Component Value Date   BUN 9 08/01/2013   Lab Results  Component Value Date   CREATININE 0.85 08/01/2013   Lab Results  Component Value Date   NA 132* 08/01/2013   K 4.2 08/01/2013   CL 100 08/01/2013   CO2 18* 08/01/2013   Lab Results  Component Value Date   CKTOTAL 75 08/01/2013   CKMB 1.7 08/01/2013   TROPONINI <0.30 08/01/2013   Lab Results  Component Value Date   WBC 11.3* 08/01/2013   HGB 10.1* 08/01/2013   HCT 29.8* 08/01/2013   MCV 87.9 08/01/2013   PLT 347 08/01/2013    Lab Results  Component Value Date   ALT 260* 08/01/2013   AST 216* 08/01/2013   ALKPHOS 61 08/01/2013   BILITOT 0.1* 08/01/2013    EKG:   Sinus rhythm, rate 85, axis within normal limits, intervals within normal limits, no acute ST-T wave changes.  08/01/2013   ASSESSMENT AND PLAN:   CARDIAC ARREST:  The etiology is unclear although the situation would fit with vagal response.  I agree with cycling enzymes and checking an echocardiogram.  If these are normal no further in patient work up would be indicated.  I would follow up with an outpatient event  monitor.  SNORING:  The patient likely has some sleep apnea.  This can be evaluated with out patient sleep study.   ELEVATED LIVER ENZYMES:  Follow up in AM.   Signed: Rollene Rotunda 08/01/2013, 1:58 PM

## 2013-08-01 NOTE — H&P (Signed)
HPI Pt is a G0 with h/o heavy bleeding and severely painful menses. PAP 2014.  No h/o mammogram. Patient denies bleeding between cycles. She wants definitive treatment with surgery.  Pt reports that she is unable to tolerate OCP's.        Past Medical History   Diagnosis  Date   .  PCOS (polycystic ovarian syndrome)     .  Fibroids         Past Surgical History   Procedure  Laterality  Date   .  External fixation arm-1982                  Current Outpatient Prescriptions on File Prior to Visit   Medication  Sig  Dispense  Refill   .  cetirizine (ZYRTEC) 10 MG tablet  Take 10 mg by mouth daily as needed for allergies.                  No current facility-administered medications on file prior to visit.       Allergies   Allergen  Reactions   .  Codeine  Hives       History       Social History   .  Marital Status:  Single       Spouse Name:  N/A       Number of Children:  N/A   .  Years of Education:  N/A       Occupational History   .  Not on file.       Social History Main Topics   .  Smoking status:  Current Every Day Smoker -- 0.50 packs/day . Last use 1 month previously      Types:  Cigarettes   .  Smokeless tobacco:  Never Used   .  Alcohol Use:  No   .  Drug Use:  No   .  Sexually Active:  Not on file       Other Topics  Concern   .  Not on file       Social History Narrative   .  No narrative on file       Family History   Problem  Relation  Age of Onset   .  Diabetes  Brother     .  Hyperlipidemia  Brother     .  Fibromyalgia  Mother     .  Cancer  Maternal Aunt         breast cancer        Review of Systems       Objective:     Physical Exam BP 124/88  Pulse 93  Temp(Src) 98.9 F (37.2 C) (Oral)  Ht 5\' 2"  (1.575 m)  Wt 266 lb 3.2 oz (120.748 kg)  BMI 48.68 kg/m2  LMP 05/08/2013 PT in NAD Lungs: CTA CV: RRR Abd: soft, NT, ND, obese GU: EGBUS: no lesions Vagina: no blood in vault Cervix: no lesion; no mucopurulent  d/c Uterus: palpable uterine mass; difficult to appreciate due to body habitus Adnexa: no masses; non tender       04/28/2013 sono Comparison: Ultrasound dated 11/16/2007   Findings:   Uterus: 11.1 x 6.3 x 7.2 cm. There is a 5.6 94.6 x 5.6 cm fibroid   in the uterine fundus. There is also a 6.0 x 5.3 x 5.7 cm fibroid   in the lower uterine segment.   Endometrium: Normal. 11.1 cm in thickness.   Right ovary:  Normal. 4.1 x 2.2 x 2.4 cm.   Left ovary: Normal. 2.7 x 2.4 x 1.8 cm.   Other findings: Trace free fluid.   IMPRESSION:   The patient now has two large fibroids in the uterus, a marked   change since the prior exam.        Assessment/Plan: Patient desires surgical management with robotic assisted hysterectomy with bilateral salpingectomy .  The risks of surgery were discussed in detail with the patient including but not limited to: bleeding which may require transfusion or reoperation; infection which may require prolonged hospitalization or re-hospitalization and antibiotic therapy; injury to bowel, bladder, ureters and major vessels or other surrounding organs; need for additional procedures including laparotomy; thromboembolic phenomenon, incisional problems and other postoperative or anesthesia complications.  Patient was told that the likelihood that her condition and symptoms will be treated effectively with this surgical management was very high; the postoperative expectations were also discussed in detail. The patient also understands the alternative treatment options which were discussed in full. All questions were answered. Will proceed as scheduled.

## 2013-08-01 NOTE — Progress Notes (Signed)
  Echocardiogram 2D Echocardiogram has been performed.  Whitney Wagner 08/01/2013, 4:12 PM

## 2013-08-01 NOTE — Progress Notes (Signed)
Report given to Rosanne Sack, RN on unit 2900.  To assume care from [facility].  Updated on CKMB/Troponin needing to be redrawn upon arrival.  Was to be redrawn in PACU before transfer, but [facility] instructed nurse that they needed to transfer patient now and that labs needed to be redrawn when at [facility].

## 2013-08-01 NOTE — Brief Op Note (Signed)
08/01/2013  10:41 AM  PATIENT:  Whitney Wagner  37 y.o. female  PRE-OPERATIVE DIAGNOSIS:  cpt [phone] - Pelvic pain, symptomatice uterine fibroids most likely the cause of her menorrhagia  POST-OPERATIVE DIAGNOSIS:  cpt #[id number] - Pelvic pain, symptomatice uterine fibroids  most likely the cause of herPEA  of  unknown  etiology menorrhagia  PROCEDURE:  Procedure(s): ROBOTIC ASSISTED TOTAL HYSTERECTOMY (N/A)- PROCEDURE NOT PERFORMED  SURGEON:  Surgeon(s) and Role:    * Willodean Rosenthal, MD - Primary    * Allie Bossier, MD - Assisting   ANESTHESIA:   general  EBL:  Total I/O In: 2000 [I.V.:2000] Out: 10 [Blood:10]  BLOOD ADMINISTERED:none  DRAINS: none   LOCAL MEDICATIONS USED:  MARCAINE     SPECIMEN:  No Specimen  DISPOSITION OF SPECIMEN:  N/A  COUNTS:  YES  TOURNIQUET:  * No tourniquets in log *  DICTATION: .Note written in EPIC  PLAN OF CARE: Transferred to [facility] ICU   PATIENT DISPOSITION:  PACU - guarded condition.   Delay start of Pharmacological VTE agent (>24hrs) due to surgical blood loss or risk of bleeding: yes

## 2013-08-01 NOTE — Progress Notes (Signed)
Patient ID: Whitney Wagner, female   DOB: [birth year], 37 y.o.   MRN: [id number] /10/2013  10:41 AM  PATIENT: Whitney Wagner 37 y.o. female  PRE-OPERATIVE DIAGNOSIS: cpt [phone] - Pelvic pain, symptomatice uterine fibroids most likely the cause of her menorrhagia  POST-OPERATIVE DIAGNOSIS: cpt #24401 - Pelvic pain, symptomatice uterine fibroids most likely the cause of herPEA of unknown etiology menorrhagia  PROCEDURE: Procedure(s):  ROBOTIC ASSISTED TOTAL HYSTERECTOMY (N/A)- PROCEDURE NOT PERFORMED  SURGEON: Surgeon(s) and Role:  * Willodean Rosenthal, MD - Primary  * Allie Bossier, MD - Assisting  ANESTHESIA: general  EBL: Total I/O  In: 2000 [I.V.:2000]  Out: 10 [Blood:10]  BLOOD ADMINISTERED:none  DRAINS: none  LOCAL MEDICATIONS USED: MARCAINE  SPECIMEN: No Specimen  DISPOSITION OF SPECIMEN: N/A  COUNTS: YES  TOURNIQUET: * No tourniquets in log *  DICTATION: .Note written in EPIC  PLAN OF CARE: Transferred to [facility] ICU  PATIENT DISPOSITION: PACU - guarded condition.  Delay start of Pharmacological VTE agent (>24hrs) due to surgical blood loss or risk of bleeding: yes  The risks, benefits, and alternatives of surgery were explained, understood, and accepted. Consents were signed. All questions were answered. She was taken to the operating room and general anesthesia was applied without complication. She was placed in the dorsal lithotomy position and her abdomen and vagina were prepped and draped after she had been carefully positioned on the table. A bimanual exam revealed a 12 week sized uterus that was mobile. Her adnexa were difficult to palpate due to the patients body habitus. The cervix was measured and the uterus was sounded to 8 cm. A Rumi uterine manipulator was placed without difficulty. A Foley catheter was placed in the patients bladder. Gloves were changed and attention was turned to the abdomen. A small incision was made in the umbilicus and a Veress needle was  placed intraperitoneally. Confirmation of placement was made with use of a water-filled syringe. CO2 was used to insufflate the abdomen to approximately 5 L. After good pneumoperitoneum was established, a 12 mm trocar was placed approximately 10cm above the umbilicus. A 5mm asistannt port was placed in the LUQ. Laparoscopy confirmed correct placement. She was placed in Trendelenburg position. At this point anesthesia informed us that pt did not have a pulse. There was no bleeding noted in the patients abdomen. The pressure was released from the patients abdomen. The ports were subsequently removed and the port sites were repaired with Dermabond. 30cc of 0.5% Marcaine was injected into the port sites. The entire report of patients response was dictated by anesthesia. A pelvic sonogram was obtained and no significant hemoperitoneum was noted.  Due to patients cardiac status. She is being transferred to [facility] ICU.

## 2013-08-01 NOTE — H&P (Signed)
PULMONARY  / CRITICAL CARE MEDICINE  Name: Whitney Wagner MRN: [id number] DOB: [birth year]    ADMISSION DATE:  08/01/2013 CONSULTATION DATE:  8/11  REFERRING MD :  Dr. Dolan Amen   PRIMARY SERVICE: PCCM   CHIEF COMPLAINT:  Cardiac arrest  BRIEF PATIENT DESCRIPTION:  37yo obese female with hx PCOS, GERD admitted 8/11 to [facility] hospital for TAH. During the case, she developed PEA arrest though r/t vagal in setting peritoneal insufflation. Required CPR x 3 with prompt ROSC. She was awake and alert post arrest and was extubated. Tx to [facility] under PCCM for further eval/mgmt.    SIGNIFICANT EVENTS / STUDIES:  8/11 PEA arrest    LINES / TUBES:   CULTURES:  ANTIBIOTICS:   HISTORY OF PRESENT ILLNESS:    37yo obese female with hx PCOS, GERD admitted 8/11 to [facility] hospital for TAH. During the case, she developed PEA arrest though r/t vagal in setting peritoneal insufflation. Required CPR x 3 with prompt ROSC. She was awake and alert post arrest and was extubated. Tx to [facility] under PCCM for further eval/mgmt.  PAST MEDICAL HISTORY :  Past Medical History  Diagnosis Date  . PCOS (polycystic ovarian syndrome)   . Osgood-Schlatter's disease of left knee     left knee  . Uterine fibroid   . Arthritis     "beginnings in my hands" (05/26/2013)  . Tooth pain 05/26/2013    "bottom right" (05/26/2013)  . Seasonal allergies   . GERD (gastroesophageal reflux disease)     otc meds prn  . Migraines, neuralgic     '~ 3/week" (05/26/2013) - otc meds prn  . Morbid obesity    Past Surgical History  Procedure Laterality Date  . External fixation arm Right 1980's  . Knee arthroscopy Left 1980's    "from Osgood-Schlatter disease" (05/26/2013)  . S/p multiple extractions with alveoloplasty  06/01/2013    Extraction of tooth #'s 5,12,30 with alveoloplasty-Dr. Kristin Bruins  . Wisdom tooth extraction    . Right arm surgery     Prior to Admission medications   Medication Sig Start Date End Date Taking?  Authorizing Provider  nicotine (NICODERM CQ - DOSED IN MG/24 HOURS) 21 mg/24hr patch Place 1 patch onto the skin daily.   Yes Historical Provider, MD   Allergies  Allergen Reactions  . Codeine Other (See Comments)    Codeine liquid iv  drip only as a child - caused hallucinations..  Can tolerate percocet, vicodin      FAMILY HISTORY:  Family History  Problem Relation Age of Onset  . Diabetes Brother   . Hyperlipidemia Brother   . Fibromyalgia Mother   . Cancer Maternal Aunt     breast cancer   SOCIAL HISTORY:  reports that she quit smoking about 3 weeks ago. Her smoking use included Cigarettes. She has a 21 pack-year smoking history. She has never used smokeless tobacco. She reports that she does not drink alcohol or use illicit drugs.  REVIEW OF SYSTEMS:    remains drowsy. Primary c/o is pleuritic type PC with deep breath, abd discomfort and thirst. She is not able to participate in complete ROS at this point   SUBJECTIVE:  Arrived to ICU. Sleepy but oriented. Working on titration of Neo.  VITAL SIGNS: Temp:  [97.4 F (36.3 C)-98.2 F (36.8 C)] 97.8 F (36.6 C) (08/11 1045) Pulse Rate:  [35-78] 35 (08/11 0950) Resp:  [20-27] 24 (08/11 1100) BP: (58-137)/(28-91) 85/48 mmHg (08/11 1100) SpO2:  [78 %-100 %]  99 % (08/11 1100) HEMODYNAMICS:   VENTILATOR SETTINGS:   INTAKE / OUTPUT: Intake/Output     08/10 0701 - 08/11 0700 08/11 0701 - 08/12 0700   I.V. (mL/kg)  3075 (25.1)   Total Intake(mL/kg)  3075 (25.1)   Blood  10   Total Output   10   Net   +3065          PHYSICAL EXAMINATION: General:  Obese white female. Sleepy w/ vague abd and chest discomfort. No acute distress.  Neuro:  Drowsy but oriented HEENT:  MM are dry, no JVD  Cardiovascular:  rrr w/out MRG Lungs:  Clear, even, no accessory muscle use. Some pain to palpation of sternum  Abdomen:  Distended, hypoactive bowel sounds. Small incisions mid-line intact  Musculoskeletal:  Intact  Skin:  Warm, good  pulses   LABS:  CBC Recent Labs     08/01/13  0630  08/01/13  0930  WBC  9.4  11.3*  HGB  14.9  10.1*  HCT  42.6  29.8*  PLT  292  347   Coag's No results found for this basename: APTT, INR,  in the last 72 hours BMET Recent Labs     08/01/13  0930  NA  132*  K  4.2  CL  100  CO2  18*  BUN  9  CREATININE  0.85  GLUCOSE  368*   Electrolytes Recent Labs     08/01/13  0930  CALCIUM  8.1*   Sepsis Markers No results found for this basename: LACTICACIDVEN, PROCALCITON, O2SATVEN,  in the last 72 hours ABG No results found for this basename: PHART, PCO2ART, PO2ART,  in the last 72 hours Liver Enzymes Recent Labs     08/01/13  0930  AST  216*  ALT  260*  ALKPHOS  61  BILITOT  0.1*  ALBUMIN  2.3*   Cardiac Enzymes No results found for this basename: TROPONINI, PROBNP,  in the last 72 hours Glucose Recent Labs     08/01/13  1033  08/01/13  1222  GLUCAP  255*  174*    Imaging No results found.   CXR:   ASSESSMENT / PLAN:  PULMONARY A:  Resolved acute resp failure s/p PEA arrest.  Chest wall pain.  C/o sternal pain. And pain w/ deep breath. Suspect this is d/t CPR.  At risk for hypoxia, appears to have OSA anatomy.  P:   Wean FIO2 Ck CXR to r/o rib fx s/p CPR  CARDIOVASCULAR A:  PEA arrest - though r/t vagal in setting peritoneal insufflation. Loss of pulse x 3 with prompt ROSC. No hypothermia protocol given prompt ROSC and normal mental status post arrest.  Hypotension  ?other etiology given somewhat prolonged nature and continued hypotension. Could simply be inaccurate data from BP cuff and body habitus.  Cards eval   P:  Recheck H&H Ck cortisol, cycle CEs and ck lactate  Wean neo to off Would consider Aline prior to CVL as not convinced that the current BP readings are accurate.  2D echo Consider LE dopplers   RENAL Hyponatremia  P:  F/u chem  Gentle volume    GASTROINTESTINAL Post-op abd pain s/p insufflation  P:  Cont NPO  for now    HEMATOLOGIC A:  Mild anemia. Hgb drop is concerning from 14.9 to 10.1. She did not complete surgery so would favor hemodilution over acute blood loss anemia  P:  SCD's for dvt proph  Surgical procedure not completed but was  started - no anticoagulation for now Recheck CBC   INFECTIOUS No obvious source infection  P:  Monitor fever, wbc curve    ENDOCRINE Hyperglycemia - no reported hx DM, glucose >300  P:  SSI  Ck a1c   NEUROLOGIC No active issue  P:  Monitor post arrest  GYN  Uterine fibroids - for TAH, procedure aborted prior to completion  P:  Per obgyn   TODAY'S SUMMARY:   37 year old female admitted s/p PEA arrest. Not sure that this was true arrest or simply vagal response and hypotension and in-ability to get accurate BP data. We will admit her to ICU for monitoring. Will need to r/o bleeding, ck lactate. If can't get her off pressors will need to consider central access.   I have personally obtained a history, examined the patient, evaluated laboratory and imaging results, formulated the assessment and plan and placed orders. CRITICAL CARE: The patient is critically ill with multiple organ systems failure and requires high complexity decision making for assessment and support, frequent evaluation and titration of therapies, application of advanced monitoring technologies and extensive interpretation of multiple databases. Critical Care Time devoted to patient care services described in this note is --- minutes.    Pulmonary and Critical Care Medicine [facility] Pager: [phone]  08/01/2013, 12:29 PM

## 2013-08-01 NOTE — Progress Notes (Signed)
Stopped in on regular rounding check.  CRNA voiced concern regarding BP/pulse.  Insufflation had occurred right before I came to the room and atropine was given prior to my arrival.  I suspect the patient had a profound vagal response to peritoneal insufflation.  Heart rate when I arrived was in the 130-150 range but I could not palpate a pulse and BP cuff/pulse ox was not registering pulsatile activity, no ETCO2.  Treated PEA with chest compressions... With prompt return of pulsatile pulse ox and palpable pulse and ETCO2.  Second anesthesiologist called to room for help.  Volatile anesthetic turned off.  We continued to attempt but without success to register non-invasive blood pressures with multiple cuffs, modules, and cables on multiple extremities.  Surgeon closed port sites with dermabond.  Several times peripheral/central pulses disappeared and chest compressions were resumed with prompt return of pulses.  Pupils were reactive at regular intervals.  IV phenylephrine was given via infusion with the goal of maintaining palpable pulses.  2nd piv placed. Multiple attempts by both anesthesiologists and several CRNAs at bilateral radial a-lines without success, two attempts by myself at placing femoral a-line but it was too deep for the arrow kit in stock at [facility], ultrasound guidance attempted at all sites.   While continuing to work on patient for access, she began to breathe over the ventilator and we allowed her to breathe with pressure support.  As we continued to work on access, the patient lifted her head, and followed commands, and took 1L tidal volume on command and was extubated.  She continued to show labile pressures in the post-operative period.  Upon further questioning of the patient's partner, there is a history of drug use including cocaine, but the history seemed unreliable from the partner.  The history of events was given by phone to the ICU attending.  We are currently working on  transfer of care.  EKG, CMET, CBC, and cardiac enzymes sent.

## 2013-08-01 NOTE — Progress Notes (Signed)
[facility]Link Physician-Brief Progress Note Patient Name: Tye L Meneely DOB: [birth year] MRN: [id number]  Date of Service  08/01/2013   HPI/Events of Note  CP post CPR.   eICU Interventions  Percocet ordered.      YACOUB,WESAM 08/01/2013, 7:37 PM

## 2013-08-01 NOTE — Progress Notes (Signed)
Noted that some of the lab work including cardiac enzymes were still not back.  Called lab and they stated that they had been cancelled.  I asked them to run them.  I communicated this with Dr. Dolan Amen.  The lab said that they will run the labs.

## 2013-08-01 NOTE — Anesthesia Postprocedure Evaluation (Signed)
  Anesthesia Post-op Note  Patient: Whitney Wagner  Procedure(s) Performed: Procedure(s): ROBOTIC ASSISTED TOTAL HYSTERECTOMY (N/A)  Patient Location: PACU  Anesthesia Type:General  Level of Consciousness: sedated  Airway and Oxygen Therapy: Patient Spontanous Breathing  Post-op Pain: mild  Post-op Assessment: Post-op Vital signs reviewed  Post-op Vital Signs: unstable Stopped in on regular rounding check.  CRNA voiced concern regarding BP/pulse.  Insufflation had occurred right before I came to the room and atropine was given prior to my arrival.  I suspect the patient had a profound vagal response to peritoneal insufflation.  Heart rate when I arrived was in the 130-150 range but I could not palpate a pulse and BP cuff/pulse ox was not registering pulsatile activity, no ETCO2.  Treated PEA with chest compressions... With prompt return of pulsatile pulse ox and palpable pulse and ETCO2.  Second anesthesiologist called to room for help.  Volatile anesthetic turned off.  We continued to attempt but without success to register non-invasive blood pressures with multiple cuffs, modules, and cables on multiple extremities.  Surgeon closed port sites with dermabond.  Several times peripheral/central pulses disappeared and chest compressions were resumed with prompt return of pulses.  Pupils were reactive at regular intervals.  IV phenylephrine was given via infusion with the goal of maintaining palpable pulses.  2nd piv placed. Multiple attempts by both anesthesiologists and several CRNAs at bilateral radial a-lines without success, two attempts by myself at placing femoral a-line but it was too deep for the arrow kit in stock at [facility], ultrasound guidance attempted at all sites.   While continuing to work on patient for access, she began to breathe over the ventilator and we allowed her to breathe with pressure support.  As we continued to work on access, the patient lifted her head,  and followed commands, and took 1L tidal volume on command and was extubated.  She continued to show labile pressures in the post-operative period.  Upon further questioning of the patient's partner, there is a history of drug use including cocaine, but the history seemed unreliable from the partner.  The history of events was given to the ICU attending.  We are currently working on transfer of care Complications: No apparent anesthesia complications

## 2013-08-01 NOTE — H&P (Signed)
Name: Whitney Wagner  MRN: [id number]  DOB: 06/20/1976  ADMISSION DATE: 08/01/2013  CONSULTATION DATE: 8/11  REFERRING MD : Dr. Dolan Amen  PRIMARY SERVICE: PCCM  CHIEF COMPLAINT: Cardiac arrest  BRIEF PATIENT DESCRIPTION:  37yo obese female with hx PCOS, GERD admitted 8/11 to [facility] hospital for TAH. During the case, she developed PEA arrest though r/t vagal in setting peritoneal insufflation. Required CPR x 3 with prompt ROSC. She was awake and alert post arrest and was extubated. Tx to [facility] under PCCM for further eval/mgmt.  SIGNIFICANT EVENTS / STUDIES:  8/11 PEA arrest  LINES / TUBES:  CULTURES:  ANTIBIOTICS:  HISTORY OF PRESENT ILLNESS:  37yo obese female with hx PCOS, GERD admitted 8/11 to [facility] hospital for TAH. During the case, she developed PEA arrest though r/t vagal in setting peritoneal insufflation. Required CPR x 3 with prompt ROSC. She was awake and alert post arrest and was extubated. Tx to [facility] under PCCM for further eval/mgmt.  PAST MEDICAL HISTORY :  Past Medical History   Diagnosis  Date   .  PCOS (polycystic ovarian syndrome)    .  Osgood-Schlatter's disease of left knee      left knee   .  Uterine fibroid    .  Arthritis      "beginnings in my hands" (05/26/2013)   .  Tooth pain  05/26/2013     "bottom right" (05/26/2013)   .  Seasonal allergies    .  GERD (gastroesophageal reflux disease)      otc meds prn   .  Migraines, neuralgic      '~ 3/week" (05/26/2013) - otc meds prn   .  Morbid obesity     Past Surgical History   Procedure  Laterality  Date   .  External fixation arm  Right  1980's   .  Knee arthroscopy  Left  1980's     "from Osgood-Schlatter disease" (05/26/2013)   .  S/p multiple extractions with alveoloplasty   06/01/2013     Extraction of tooth #'s 5,12,30 with alveoloplasty-Dr. Kristin Bruins   .  Wisdom tooth extraction     .  Right arm surgery      Prior to Admission medications   Medication  Sig  Start Date  End Date  Taking?  Authorizing  Provider   nicotine (NICODERM CQ - DOSED IN MG/24 HOURS) 21 mg/24hr patch  Place 1 patch onto the skin daily.    Yes  Historical Provider, MD    Allergies   Allergen  Reactions   .  Codeine  Other (See Comments)     Codeine liquid iv drip only as a child - caused hallucinations.. Can tolerate percocet, vicodin    FAMILY HISTORY:  Family History   Problem  Relation  Age of Onset   .  Diabetes  Brother    .  Hyperlipidemia  Brother    .  Fibromyalgia  Mother    .  Cancer  Maternal Aunt      breast cancer    SOCIAL HISTORY:  reports that she quit smoking about 3 weeks ago. Her smoking use included Cigarettes. She has a 21 pack-year smoking history. She has never used smokeless tobacco. She reports that she does not drink alcohol or use illicit drugs.  REVIEW OF SYSTEMS:  remains drowsy. Primary c/o is pleuritic type PC with deep breath, abd discomfort and thirst. She is not able to participate in complete ROS at this point  SUBJECTIVE:  Arrived to ICU. Sleepy but oriented. Working on titration of Neo.  VITAL SIGNS:  Temp: [97.4 F (36.3 C)-98.2 F (36.8 C)] 97.8 F (36.6 C) (08/11 1045)  Pulse Rate: [35-78] 35 (08/11 0950)  Resp: [20-27] 24 (08/11 1100)  BP: (58-137)/(28-91) 85/48 mmHg (08/11 1100)  SpO2: [78 %-100 %] 99 % (08/11 1100)  HEMODYNAMICS:   VENTILATOR SETTINGS:   INTAKE / OUTPUT:  Intake/Output  08/10 0701 - 08/11 0700 08/11 0701 - 08/12 0700  I.V. (mL/kg) 3075 (25.1)  Total Intake(mL/kg) 3075 (25.1)  Blood 10  Total Output 10  Net +3065   PHYSICAL EXAMINATION:  General: Obese white female. Sleepy w/ vague abd and chest discomfort. No acute distress.  Neuro: Drowsy but oriented  HEENT: MM are dry, no JVD  Cardiovascular: rrr w/out MRG  Lungs: Clear, even, no accessory muscle use. Some pain to palpation of sternum  Abdomen: Distended, hypoactive bowel sounds. Small incisions mid-line intact  Musculoskeletal: Intact  Skin: Warm, good pulses    PULMONARY No results found for this basename: PHART, PCO2, PCO2ART, PO2, PO2ART, HCO3, TCO2, O2SAT,  in the last 168 hours  CBC  Recent Labs Lab 08/01/13 0630 08/01/13 0930 08/01/13 1424  HGB 14.9 10.1* 9.9*  HCT 42.6 29.8* 28.0*  WBC 9.4 11.3* 28.3*  PLT 292 347 353    COAGULATION No results found for this basename: INR,  in the last 168 hours  CARDIAC   Recent Labs Lab 08/01/13 0930 08/01/13 1424  TROPONINI <0.30 <0.30    Recent Labs Lab 08/01/13 1424  PROBNP 42.3     CHEMISTRY  Recent Labs Lab 08/01/13 0930 08/01/13 1424  NA 132* 134*  K 4.2 4.8  CL 100 104  CO2 18* 20  GLUCOSE 368* 197*  BUN 9 10  CREATININE 0.85 0.68  CALCIUM 8.1* 8.0*  MG  --  1.7  PHOS  --  2.6   Estimated Creatinine Clearance: 120.2 ml/min (by C-G formula based on Cr of 0.68).   LIVER  Recent Labs Lab 08/01/13 0930 08/01/13 1424  AST 216* 209*  ALT 260* 254*  ALKPHOS 61 43  BILITOT 0.1* 0.2*  PROT 4.3* 5.2*  ALBUMIN 2.3* 2.7*     INFECTIOUS  Recent Labs Lab 08/01/13 1425  LATICACIDVEN 4.0*     ENDOCRINE CBG (last 3)   Recent Labs  08/01/13 1033 08/01/13 1222 08/01/13 1622  GLUCAP 255* 174* 175*         IMAGING x48h  [place] Intraoperative  08/01/2013   CLINICAL DATA: visualization   Ultrasound was provided for use by the ordering physician, and a technical  charge was applied by the performing facility.  No radiologist  interpretation/professional services rendered.    Dg Chest Port 1 View  08/01/2013   *RADIOLOGY REPORT*  Clinical Data: Chest pain, rule out rib fracture left side  PORTABLE CHEST - 1 VIEW  Comparison: 05/23/2011  Findings: Heart size and vascular pattern normal.  Lungs clear.  No pneumothorax or effusion.  Bony thorax grossly intact.  IMPRESSION: Single AP view cannot exclude rib fracture.  Allowing for this limitation, study is normal.   Original Report Authenticated By: Esperanza Heir, M.D.       CXR:  ASSESSMENT /  PLAN:  PULMONARY  A:  Resolved acute resp failure s/p PEA arrest.  Chest wall pain.  C/o sternal pain. And pain w/ deep breath. Suspect this is d/t CPR.  At risk for hypoxia, appears to have OSA anatomy.  P:  Wean  FIO2  Ck CXR to r/o rib fx s/p CPR  CARDIOVASCULAR  A:  PEA arrest - though r/t vagal in setting peritoneal insufflation. Loss of pulse x 3 with prompt ROSC. No hypothermia protocol given prompt ROSC and normal mental status post arrest.  Hypotension  ?other etiology given somewhat prolonged nature and continued hypotension. Could simply be inaccurate data from BP cuff and body habitus.  Cards eval  P:  Recheck H&H  Ck cortisol, cycle CEs and ck lactate  Wean neo to off  Would consider Aline prior to CVL as not convinced that the current BP readings are accurate.  2D echo  Consider LE dopplers  RENAL  Hyponatremia  P:  F/u chem  Gentle volume  GASTROINTESTINAL  Post-op abd pain s/p insufflation  P:  Cont NPO for now  HEMATOLOGIC  A: Mild anemia. Hgb drop is concerning from 14.9 to 10.1. She did not complete surgery so would favor hemodilution over acute blood loss anemia  P:  SCD's for dvt proph  Surgical procedure not completed but was started - no anticoagulation for now  Recheck CBC  INFECTIOUS  No obvious source infection  P:  Monitor fever, wbc curve  ENDOCRINE  Hyperglycemia - no reported hx DM, glucose >300  P:  SSI  Ck a1c  NEUROLOGIC  No active issue  P:  Monitor post arrest  GYN  Uterine fibroids - for TAH, procedure aborted prior to completion  P:  Per obgyn   PETE BABCOCK NP at 12:28pm  TODAY'S SUMMARY:  37 year old female admitted s/p PEA arrest. Not sure that this was true arrest or simply vagal response and hypotension and in-ability to get accurate BP data. We will admit her to ICU for monitoring. Will need to r/o bleeding, ck lactate. If can't get her off pressors will need to consider central access.   STAFF NOTE: I, Dr Lavinia Sharps have personally reviewed patient's available data, including medical history, events of note, physical examination and test results as part of my evaluation. I have discussed with resident/NP and other care providers such as pharmacist, RN and RRT.  In addition,  I personally evaluated patient and elicited key findings of likely vasovagal with brief arrrest as evidenced by high lactate. Continue IV Fluuids. REcheck lactate  Rest per NP/medical resident whose note is outlined above and that I agree with  The patient is critically ill with multiple organ systems failure and requires high complexity decision making for assessment and support, frequent evaluation and titration of therapies, application of advanced monitoring technologies and extensive interpretation of multiple databases.   Critical Care Time devoted to patient care services described in this note is  35  Minutes independent of NP time.  Dr. Kalman Shan, M.D., [facility].C.P Pulmonary and Critical Care Medicine Staff Physician [facility] [place] Pulmonary and Critical Care Pager: [phone], If no answer or between  15:00h - 7:00h: call [phone]  08/01/2013 7:26 PM

## 2013-08-02 ENCOUNTER — Encounter (HOSPITAL_COMMUNITY): Payer: Self-pay | Admitting: Anesthesiology

## 2013-08-02 ENCOUNTER — Encounter (HOSPITAL_COMMUNITY): Payer: Self-pay

## 2013-08-02 ENCOUNTER — Inpatient Hospital Stay (HOSPITAL_COMMUNITY): Payer: BC Managed Care – PPO

## 2013-08-02 DIAGNOSIS — R578 Other shock: Secondary | ICD-10-CM

## 2013-08-02 DIAGNOSIS — K661 Hemoperitoneum: Secondary | ICD-10-CM

## 2013-08-02 DIAGNOSIS — N949 Unspecified condition associated with female genital organs and menstrual cycle: Secondary | ICD-10-CM

## 2013-08-02 LAB — PROTIME-INR
INR: 1.06 (ref 0.00–1.49)
Prothrombin Time: 13.6 seconds (ref 11.6–15.2)

## 2013-08-02 LAB — HEMOGLOBIN AND HEMATOCRIT, BLOOD
HCT: 27.2 % — ABNORMAL LOW (ref 36.0–46.0)
Hemoglobin: 9.6 g/dL — ABNORMAL LOW (ref 12.0–15.0)

## 2013-08-02 LAB — GLUCOSE, CAPILLARY
Glucose-Capillary: 102 mg/dL — ABNORMAL HIGH (ref 70–99)
Glucose-Capillary: 133 mg/dL — ABNORMAL HIGH (ref 70–99)
Glucose-Capillary: 192 mg/dL — ABNORMAL HIGH (ref 70–99)

## 2013-08-02 LAB — CBC
Hemoglobin: 7.8 g/dL — ABNORMAL LOW (ref 12.0–15.0)
MCH: 30 pg (ref 26.0–34.0)
MCHC: 35.5 g/dL (ref 30.0–36.0)
Platelets: 287 10*3/uL (ref 150–400)
RBC: 2.6 MIL/uL — ABNORMAL LOW (ref 3.87–5.11)

## 2013-08-02 LAB — ABO/RH: ABO/RH(D): B POS

## 2013-08-02 LAB — CORTISOL: Cortisol, Plasma: 15.7 ug/dL

## 2013-08-02 LAB — PREPARE RBC (CROSSMATCH)

## 2013-08-02 MED ORDER — FAMOTIDINE 20 MG PO TABS
20.0000 mg | ORAL_TABLET | Freq: Two times a day (BID) | ORAL | Status: DC
  Administered 2013-08-02 – 2013-08-03 (×3): 20 mg via ORAL
  Filled 2013-08-02 (×4): qty 1

## 2013-08-02 MED ORDER — FUROSEMIDE 10 MG/ML IJ SOLN
40.0000 mg | Freq: Once | INTRAMUSCULAR | Status: AC
  Administered 2013-08-02: 40 mg via INTRAVENOUS

## 2013-08-02 MED ORDER — NICOTINE 21 MG/24HR TD PT24
21.0000 mg | MEDICATED_PATCH | Freq: Every day | TRANSDERMAL | Status: DC
  Administered 2013-08-03: 21 mg via TRANSDERMAL
  Filled 2013-08-02 (×2): qty 1

## 2013-08-02 MED ORDER — ZOLPIDEM TARTRATE 5 MG PO TABS
5.0000 mg | ORAL_TABLET | Freq: Every evening | ORAL | Status: DC | PRN
  Administered 2013-08-02: 5 mg via ORAL
  Filled 2013-08-02: qty 1

## 2013-08-02 MED ORDER — MAGNESIUM SULFATE 40 MG/ML IJ SOLN
2.0000 g | Freq: Once | INTRAMUSCULAR | Status: AC
  Administered 2013-08-02: 2 g via INTRAVENOUS
  Filled 2013-08-02: qty 50

## 2013-08-02 NOTE — Progress Notes (Signed)
PT/INR ordered per Dr. Molli Knock. Lab stated that unable to draw related to patient receiving blood. Lab also stated that blood would have done infusing for atleast 1 hour prior to lab draw. Lab will return to collect PT/INR post last transfusion.

## 2013-08-02 NOTE — Progress Notes (Signed)
Patient noted to be tachy with exertion when getting out of bed to chair. Labs reviewed and RN noted a 7gm drop in hemoglobin. Dr. Sandria Manly notified and RN awaiting orders. Will continue to monitor patient closely and notify MD as needed.

## 2013-08-02 NOTE — H&P (Signed)
Signed yesterday  Dr. Kalman Shan, M.D., [facility].C.P Pulmonary and Critical Care Medicine Staff Physician [facility] [place] Pulmonary and Critical Care Pager: [phone], If no answer or between  15:00h - 7:00h: call [phone]  08/02/2013 11:11 AM

## 2013-08-02 NOTE — Progress Notes (Signed)
Patient c/o abdominal pressure and feeling a great urge to void. Patient placed on bedpan with inability to void. Patient then assisted up to bedside commode, still with the inability to void. Dribbling noted. Patient had received 40mg  IV lasix between first and second units of blood. Foley huddle held and there was a great indication for foley insertion. Foley inserted per protocol with immediate return >1L. Patient immediately voice relief of abdominal pressure.

## 2013-08-02 NOTE — Progress Notes (Signed)
Name: Kania L Robel MRN: [id number] DOB: [birth year]  ELECTRONIC ICU PHYSICIAN NOTE  Problem:  Pt requesting nicotine patch as at home and sleep aid  Intervention:  Resume nicotine 21 mg and ambien 10 mg qhs prn   Sandrea Hughs 08/02/2013, 10:25 PM

## 2013-08-02 NOTE — Progress Notes (Signed)
Dr. Sherene Sires with CCM notified of patients lack of PIV for administration of Mag IV. Patients mag level 1.7. Current PIV in use with PRBC transfusion. Order received to administer Mag IV later tonight after 3 units of PRBCs administered. Mag dose moved to 10pm and will continue to be moved to later due time until all of blood administered.

## 2013-08-02 NOTE — Progress Notes (Signed)
Patient: Whitney Wagner Date of Encounter: 08/02/2013, 7:32 AM Admit date: 08/01/2013     Subjective  Cardiology called yesterday for evaluation post reported PEA arrest in OR. She was admitted to [facility] for TAH for treatement of pelvic pain with symptomatic uterine fibroids and menorrhagia. Surgery was robotic assisted with general anesthesia. The procedure was started with unsufflation of the abdomen with 5 L of CO2. She subsequently was reported to be pulseless. Pressure was released from the abdomen and the procedure aborted with all hardware removed. There was no evidence of any surgical trauma. The patient was reported to have PEA with a heart rate of 130 on the monitor but no BP or pulse. She was treated during the event with atropine. She did have chest compressions and was intubated with subsequent spontaneous return of ventilation over the vent settings. She was subsequently alert, following commands and able to be extubated prior to transfer out of OR. She has no cardiac history.   Objective  Physical Exam: Vitals: BP 106/62  Pulse 115  Temp(Src) 97.8 F (36.6 C) (Oral)  Resp 24  Ht 5\' 2"  (1.575 m)  Wt 272 lb 7.8 oz (123.6 kg)  BMI 49.83 kg/m2  SpO2 98%  LMP 07/06/2013 General: Well developed, well appearing 37 year old female in no acute distress. Neck: Supple. JVD not elevated. Lungs: Clear bilaterally to auscultation without wheezes, rales, or rhonchi. Breathing is unlabored. Heart: RRR S1 S2 without murmurs, rubs, or gallops.  Abdomen: Appears soft, non-distended. Extremities: No clubbing or cyanosis. No edema.  Distal pedal pulses are 2+ and equal bilaterally. Neuro: Alert and oriented X 3. Moves all extremities spontaneously. No focal deficits.  Intake/Output:  Intake/Output Summary (Last 24 hours) at 08/02/13 0732 Last data filed at 08/02/13 0600  Gross per 24 hour  Intake 2513.13 ml  Output   2635 ml  Net -121.87 ml    Inpatient Medications:    . famotidine (PEPCID) IV  20 mg Intravenous Q12H  . insulin aspart  2-6 Units Subcutaneous Q4H   . sodium chloride 100 mL/hr (08/01/13 2005)  . phenylephrine (NEO-SYNEPHRINE) Adult infusion Stopped (08/02/13 0530)    Labs:  Recent Labs  08/01/13 0930 08/01/13 1424  NA 132* 134*  K 4.2 4.8  CL 100 104  CO2 18* 20  GLUCOSE 368* 197*  BUN 9 10  CREATININE 0.85 0.68  CALCIUM 8.1* 8.0*  MG  --  1.7  PHOS  --  2.6    Recent Labs  08/01/13 0930 08/01/13 1424  AST 216* 209*  ALT 260* 254*  ALKPHOS 61 43  BILITOT 0.1* 0.2*  PROT 4.3* 5.2*  ALBUMIN 2.3* 2.7*    Recent Labs  08/01/13 2027 08/02/13 0440  WBC 22.0* 17.1*  HGB 9.7* 7.8*  HCT 27.6* 22.0*  MCV 84.9 84.6  PLT 373 287    Recent Labs  08/01/13 0930 08/01/13 1424 08/01/13 2026 08/02/13 0130  CKTOTAL 75  --   --   --   CKMB 1.7  --   --   --   TROPONINI <0.30 <0.30 <0.30 <0.30    Recent Labs  08/01/13 2026  DDIMER 1.53*    Recent Labs  08/01/13 1424  HGBA1C 5.4    Radiology/Studies: Dg Chest Port 1 View 08/01/2013   *RADIOLOGY REPORT*  Clinical Data: Chest pain, rule out rib fracture left side  PORTABLE CHEST - 1 VIEW  Comparison: 05/23/2011  Findings: Heart size and vascular pattern normal.  Lungs clear.  No pneumothorax or effusion.  Bony thorax grossly intact.  IMPRESSION: Single AP view cannot exclude rib fracture.  Allowing for this limitation, study is normal.   Original Report Authenticated By: Esperanza Heir, M.D.    Echocardiogram: Study Conclusions - Left ventricle: The cavity size was normal. There was mild focal basal hypertrophy of the septum. Systolic function was vigorous. The estimated ejection fraction was in the range of 65% to 70%. Wall motion was normal; there were no regional wall motion abnormalities. - Left atrium: The atrium was mildly dilated. - Pericardium, extracardiac: A trivial pericardial effusion was identified posterior to the heart.   Telemetry: SR;  no arrhythmias ECG  Sinus 13/08/33 (QTc-460)but aboout 440 by Frances Furbish correction>> no arrhythmia noted   Assessment and Plan  1. Questionable PEA arrest intraoperatively - suspected vagal in setting of peritoneal insufflation - required CPR x 3 rounds with prompt ROSC and normal mentation - CEs negative - echo shows normal LVEF with no WMAs 2. Anemia  - per primary team 3. Elevated transaminases - per primary team 4. Probable OSA - recommend outpatient sleep study 5. Tobacco abuse - cessation advised  Dr. Graciela Husbands to see Signed, EDMISTEN, BROOKE PA-C  Agree w assessment Hx of VVSyncope as a teenager Will sign'off

## 2013-08-02 NOTE — Progress Notes (Signed)
  Got call from RN. I am at remote location cross covering  Patient got very tachy when RN made her sit Notice huge hgb drop post  CPR and post op No obvious bleed  per RN BP ok per RN   Recent Labs Lab 08/01/13 0630 08/01/13 0930 08/01/13 1424 08/01/13 2027 08/02/13 0440  HGB 14.9 10.1* 9.9* 9.7* 7.8*   A: ? Cause for hgb drop  P CT abd/pelvis wo contrast - r/o rp hge   Dr. Kalman Shan, M.D., [facility].C.P Pulmonary and Critical Care Medicine Staff Physician [facility]  Pulmonary and Critical Care Pager: [phone], If no answer or between  15:00h - 7:00h: call [phone]  08/02/2013 9:01 AM

## 2013-08-02 NOTE — Progress Notes (Signed)
Pt s/p the onset of a Robotic assisted hyst- 2 ports were placed and a hemoperitoneum was obtained  but, due to a cardiac event the ports were removed without completing the procedure.  Pt now with falling Hct overnight now with tachycardia.   Subjective: Patient reports tolerating PO and no problems voiding.  She c/o feeling cold and weak.  Had dizziness with sitting in chair.  She c/o feeling very hungry.  Had a large breakfast.  She denies N/V and  Reports that her lower abd pain is the same pain she had prev and her upper abd pain is at her midline port site.   Objective: I have reviewed patient's vital signs, intake and output, medications, labs and radiology results. BP 112/65  Pulse 105  Temp(Src) 98.5 F (36.9 C) (Oral)  Resp 29  Ht 5\' 2"  (1.575 m)  Wt 272 lb 7.8 oz (123.6 kg)  BMI 49.83 kg/m2  SpO2 100%  LMP 07/06/2013  General: alert and no distress Resp: diminished breath sounds RLL Cardio: regular rate and rhythm, S1, S2 normal, no murmur, click, rub or gallop GI: normal findings: bowel sounds normal and soft, non-tender, abnormal findings:  obese, incision: clean, dry and intact and n/a Extremities: extremities normal, atraumatic, no cyanosis or edema  CBC    Component Value Date/Time   WBC 17.1* 08/02/2013 0440   RBC 2.60* 08/02/2013 0440   HGB 7.8* 08/02/2013 0440   HCT 22.0* 08/02/2013 0440   PLT 287 08/02/2013 0440   MCV 84.6 08/02/2013 0440   MCH 30.0 08/02/2013 0440   MCHC 35.5 08/02/2013 0440   RDW 13.0 08/02/2013 0440   LYMPHSABS 2.7 05/26/2013 2000   MONOABS 0.6 05/26/2013 2000   EOSABS 0.3 05/26/2013 2000   BASOSABS 0.1 05/26/2013 2000  Prev pre op Hct %42  CT ABDOMEN AND PELVIS WITHOUT CONTRAST  Technique: Multidetector CT imaging of the abdomen and pelvis was  performed following the standard protocol without intravenous  contrast.  Comparison: Four quadrant abdominal ultrasound yesterday.  Findings: Interval increase in size of the high-density ascites in   the paracolic gutters of the abdomen and dependently in the pelvis  since the ultrasound yesterday. A more organized hematoma is  present in the mid pelvis measuring approximately 10.19 x 9.3 x 8.7  cm (series 2, image 87 and coronal image 78). Urinary bladder  unremarkable. Edema in the subcutaneous fat of the anterior  abdominal wall above the umbilicus and in the left lateral  abdominal wall, presumably post surgical or related to subcutaneous  injections.  Diffuse hepatic steatosis with focal sparing surrounding the  gallbladder. Within the limits of the unenhanced technique, no  focal hepatic parenchymal abnormality. Normal unenhanced  appearance of the spleen, pancreas, right adrenal glands, and both  kidneys. Approximate 1.6 x 1.4 cm low attenuation nodule arising  from the left adrenal gland. Gallbladder unremarkable by CT. No  biliary ductal dilation. No visible aorto-iliac atherosclerosis.  No significant lymphadenopathy.  Stomach, small bowel, and colon normal in appearance. Normal-  appearing short decompressed appendix in the right mid abdomen, as  the cecum is positioned in the right upper quadrant.  Small right pleural effusion and atelectasis in both lower lobes,  left greater than right. Very small pericardial effusion versus  pericardial thickening. Heart size normal. Bone window images  demonstrate mild facet degenerative changes involving the lower  lumbar spine.  IMPRESSION:  1. Hemoperitoneum and large hematoma in the midline of the pelvis.  The amount of intraperitoneal  blood has increased since the for  quadrant ultrasound performed yesterday.  2. Small right pleural effusion. Atelectasis in the lower lobes,  right greater than left.  3. Small pericardial effusion versus pericardial thickening.  4. Diffuse hepatic steatosis with focal sparing surrounding the  gallbladder. No focal hepatic parenchymal abnormality.  5. Small left adrenal nodule statistically  consistent with  adenoma.   Assessment: 37yo WF transferred to [facility] ICU due to Cardiac event at onset of procedure.  The etiology appears to be a pronounced vasovagal response.  Pt is now  stable, tolerating diet and anemia- It is possible that pt has a slow bleed from the omentum or from her midline port site. It does not appear that she has any active bleeding site at this time. She is clinically stable and although a hematoma is noted, she is a poor surgical risk.  Will transfuse and follow.  If the bleeding stabilizes, will not perform a laparotomy.  I will continue to follow with you.  Plan: Clear liquids Monitor accurate I's and O's Transfuse 3units PRBS Follow serial hematocrits  LOS: 1 day    HARRAWAY-SMITH, Blaire Palomino 08/02/2013, 12:09 PM

## 2013-08-02 NOTE — H&P (Signed)
PULMONARY  / CRITICAL CARE MEDICINE  Name: Whitney Wagner MRN: [id number] DOB: 1976-05-26    ADMISSION DATE:  08/01/2013 CONSULTATION DATE:  8/11  REFERRING MD :  Dr. Dolan Amen   PRIMARY SERVICE: PCCM   CHIEF COMPLAINT:  Cardiac arrest  BRIEF PATIENT DESCRIPTION:  37yo obese female with hx PCOS, GERD admitted 8/11 to [facility] hospital for TAH. During the case, she developed PEA arrest though r/t vagal in setting peritoneal insufflation. Required CPR x 3 with prompt ROSC. She was awake and alert post arrest and was extubated. Tx to [facility] under PCCM for further eval/mgmt.    SIGNIFICANT EVENTS / STUDIES:  8/11 PEA arrest    LINES / TUBES:   CULTURES:  ANTIBIOTICS:   HISTORY OF PRESENT ILLNESS:    37yo obese female with hx PCOS, GERD admitted 8/11 to [facility] hospital for TAH. During the case, she developed PEA arrest though r/t vagal in setting peritoneal insufflation. Required CPR x 3 with prompt ROSC. She was awake and alert post arrest and was extubated. Tx to [facility] under PCCM for further eval/mgmt.  PAST MEDICAL HISTORY :  Past Medical History  Diagnosis Date  . PCOS (polycystic ovarian syndrome)   . Osgood-Schlatter's disease of left knee     left knee  . Uterine fibroid   . Arthritis     "beginnings in my hands" (05/26/2013)  . Tooth pain 05/26/2013    "bottom right" (05/26/2013)  . Seasonal allergies   . GERD (gastroesophageal reflux disease)     otc meds prn  . Migraines, neuralgic     '~ 3/week" (05/26/2013) - otc meds prn  . Morbid obesity    Past Surgical History  Procedure Laterality Date  . External fixation arm Right 1980's  . S/p multiple extractions with alveoloplasty  06/01/2013    Extraction of tooth #'s 5,12,30 with alveoloplasty-Dr. Kristin Bruins  . Wisdom tooth extraction    . Robotic assisted total hysterectomy N/A 08/01/2013    Procedure: ROBOTIC ASSISTED TOTAL HYSTERECTOMY;  Surgeon: Willodean Rosenthal, MD;  Location: [facility];  Service: Gynecology;   Laterality: N/A;   Prior to Admission medications   Medication Sig Start Date End Date Taking? Authorizing Provider  nicotine (NICODERM CQ - DOSED IN MG/24 HOURS) 21 mg/24hr patch Place 1 patch onto the skin daily.   Yes Historical Provider, MD   Allergies  Allergen Reactions  . Codeine Other (See Comments)    Codeine liquid iv  drip only as a child - caused hallucinations..  Can tolerate percocet, vicodin      FAMILY HISTORY:  Family History  Problem Relation Age of Onset  . Diabetes Brother   . Hyperlipidemia Brother   . Fibromyalgia Mother   . Cancer Maternal Aunt     breast cancer   SOCIAL HISTORY:  reports that she quit smoking about 3 weeks ago. Her smoking use included Cigarettes. She has a 21 pack-year smoking history. She quit smokeless tobacco use about 4 weeks ago. She reports that she does not drink alcohol or use illicit drugs.  REVIEW OF SYSTEMS:    remains drowsy. Primary c/o is pleuritic type PC with deep breath, abd discomfort and thirst. She is not able to participate in complete ROS at this point   SUBJECTIVE:  Arrived to ICU. Sleepy but oriented. Working on titration of Neo.  VITAL SIGNS: Temp:  [97.6 F (36.4 C)-98.6 F (37 C)] 98.5 F (36.9 C) (08/12 0800) Pulse Rate:  [86-134] 112 (08/12 1111) Resp:  [14-41]  28 (08/12 1111) BP: (82-143)/(39-97) 122/78 mmHg (08/12 1111) SpO2:  [93 %-100 %] 99 % (08/12 1111) Weight:  [122.7 kg (270 lb 8.1 oz)-123.6 kg (272 lb 7.8 oz)] 123.6 kg (272 lb 7.8 oz) (08/12 0500) HEMODYNAMICS:   VENTILATOR SETTINGS:   INTAKE / OUTPUT: Intake/Output     08/11 0701 - 08/12 0700 08/12 0701 - 08/13 0700   P.O.  480   I.V. (mL/kg) 2513.1 (20.3) 200 (1.6)   IV Piggyback 100    Total Intake(mL/kg) 2613.1 (21.1) 680 (5.5)   Urine (mL/kg/hr) 2625 500 (0.9)   Blood 10    Total Output 2635 500   Net -21.9 +180         PHYSICAL EXAMINATION: General:  Obese white female. Alert and interactive, following command.  Neuro:   Alert and oriented HEENT:  MM are dry, no JVD  Cardiovascular:  rrr w/out MRG Lungs:  Clear, even, no accessory muscle use. Some pain to palpation of sternum  Abdomen:  Distended, hypoactive bowel sounds. Small incisions mid-line intact.  Tender to palpation. Musculoskeletal:  Intact. Skin:  Warm, good pulses   LABS:  CBC Recent Labs     08/01/13  1424  08/01/13  2027  08/02/13  0440  WBC  28.3*  22.0*  17.1*  HGB  9.9*  9.7*  7.8*  HCT  28.0*  27.6*  22.0*  PLT  353  373  287   Coag's Recent Labs     08/01/13  2026  APTT  23*   BMET Recent Labs     08/01/13  0930  08/01/13  1424  NA  132*  134*  K  4.2  4.8  CL  100  104  CO2  18*  20  BUN  9  10  CREATININE  0.85  0.68  GLUCOSE  368*  197*   Electrolytes Recent Labs     08/01/13  0930  08/01/13  1424  CALCIUM  8.1*  8.0*  MG   --   1.7  PHOS   --   2.6   Sepsis Markers No results found for this basename: LACTICACIDVEN, PROCALCITON, O2SATVEN,  in the last 72 hours ABG No results found for this basename: PHART, PCO2ART, PO2ART,  in the last 72 hours Liver Enzymes Recent Labs     08/01/13  0930  08/01/13  1424  AST  216*  209*  ALT  260*  254*  ALKPHOS  61  43  BILITOT  0.1*  0.2*  ALBUMIN  2.3*  2.7*   Cardiac Enzymes Recent Labs     08/01/13  0930  08/01/13  1424  08/01/13  2026  08/02/13  0130  TROPONINI  <0.30  <0.30  <0.30  <0.30  PROBNP   --   42.3   --    --    Glucose Recent Labs     08/01/13  1622  08/01/13  2009  08/01/13  2127  08/02/13  0027  08/02/13  0425  08/02/13  0825  GLUCAP  175*  177*  180*  160*  192*  133*    Imaging Ct Abdomen Pelvis Wo Contrast  08/02/2013   *RADIOLOGY REPORT*  Clinical Data: Postop day #1 hysterectomy.  Anemia with acute drop in hemoglobin.  CT ABDOMEN AND PELVIS WITHOUT CONTRAST  Technique:  Multidetector CT imaging of the abdomen and pelvis was performed following the standard protocol without intravenous contrast.  Comparison: Four  quadrant abdominal ultrasound yesterday.  Findings:  Interval increase in size of the high-density ascites in the paracolic gutters of the abdomen and dependently in the pelvis since the ultrasound yesterday.  A more organized hematoma is present in the mid pelvis measuring approximately 10.19 x 9.3 x 8.7 cm (series 2, image 87 and coronal image 78).  Urinary bladder unremarkable.  Edema in the subcutaneous fat of the anterior abdominal wall above the umbilicus and in the left lateral abdominal wall, presumably post surgical or related to subcutaneous injections.  Diffuse hepatic steatosis with focal sparing surrounding the gallbladder.  Within the limits of the unenhanced technique, no focal hepatic parenchymal abnormality.  Normal unenhanced appearance of the spleen, pancreas, right adrenal glands, and both kidneys.  Approximate 1.6 x 1.4 cm low attenuation nodule arising from the left adrenal gland. Gallbladder unremarkable by CT.  No biliary ductal dilation.  No visible aorto-iliac atherosclerosis. No significant lymphadenopathy.  Stomach, small bowel, and colon normal in appearance.  Normal- appearing short decompressed appendix in the right mid abdomen, as the cecum is positioned in the right upper quadrant.  Small right pleural effusion and atelectasis in both lower lobes, left greater than right.  Very small pericardial effusion versus pericardial thickening.  Heart size normal.  Bone window images demonstrate mild facet degenerative changes involving the lower lumbar spine.  IMPRESSION:  1.  Hemoperitoneum and large hematoma in the midline of the pelvis. The amount of intraperitoneal blood has increased since the for quadrant ultrasound performed yesterday. 2.  Small right pleural effusion.  Atelectasis in the lower lobes, right greater than left. 3.  Small pericardial effusion versus pericardial thickening. 4.  Diffuse hepatic steatosis with focal sparing surrounding the gallbladder.  No focal hepatic  parenchymal abnormality. 5.  Small left adrenal nodule statistically consistent with adenoma.  These results will be called to the ordering clinician or representative by the Radiologist Assistant, and communication documented in the PACS Dashboard.   Original Report Authenticated By: Hulan Saas, M.D.   [place] Intraoperative  08/01/2013   CLINICAL DATA: visualization   Ultrasound was provided for use by the ordering physician, and a technical  charge was applied by the performing facility.  No radiologist  interpretation/professional services rendered.    Dg Chest Port 1 View  08/01/2013   *RADIOLOGY REPORT*  Clinical Data: Chest pain, rule out rib fracture left side  PORTABLE CHEST - 1 VIEW  Comparison: 05/23/2011  Findings: Heart size and vascular pattern normal.  Lungs clear.  No pneumothorax or effusion.  Bony thorax grossly intact.  IMPRESSION: Single AP view cannot exclude rib fracture.  Allowing for this limitation, study is normal.   Original Report Authenticated By: Esperanza Heir, M.D.   CXR:   ASSESSMENT / PLAN:  PULMONARY A:  Resolved acute resp failure s/p PEA arrest.  Chest wall pain.  C/o sternal pain. And pain w/ deep breath. Suspect this is d/t CPR.  At risk for hypoxia, appears to have OSA anatomy.  P:   - Wean FIO2 for sat of 92-95% - Ck 75, CXR noted, no evidence of rib fracture but not adequate for fracture ID but clinically patient is stable.  CARDIOVASCULAR A:  PEA arrest - though r/t vagal in setting peritoneal insufflation. Loss of pulse x 3 with prompt ROSC. No hypothermia protocol given prompt ROSC and normal mental status post arrest.  Hypotension   P:  - Recheck H&H. - Ck 79, cortisol 27.9, trop negative. - Wean neo to off after transfusion. - 2D echo per cards.  RENAL Hyponatremia  P:  - F/u chem. - Gentle volume. - Transfuse two units.  GASTROINTESTINAL Post-op abd pain s/p insufflation  P:  - Maintain NPO. - Abd/pelvic CT with evidence of  blood. - GYN consulted and are to come see patient.  HEMATOLOGIC A:  Mild anemia. Hgb drop is concerning from 14.9 to 10.1. She did not complete surgery so would favor hemodilution over acute blood loss anemia  P:  - SCD's for dvt proph  - Surgical procedure not completed but was started - no anticoagulation for now - Recheck H&H q6 hours.  INFECTIOUS No obvious source infection  P:  - Monitor fever, wbc curve.  ENDOCRINE Hyperglycemia - no reported hx DM, glucose >300  P:  - SSI.  NEUROLOGIC No active issue  P:  Monitor post arrest  GYN  Uterine fibroids - for TAH, procedure aborted prior to completion. Blood in abdomen on CT. P:  - GYN reconsulted.  TODAY'S SUMMARY:   37 year old female admitted s/p PEA arrest. Not sure that this was true arrest or simply vagal response and hypotension and in-ability to get accurate BP data. This AM patient dropped her Hg and became hypotensive.   I have personally obtained a history, examined the patient, evaluated laboratory and imaging results, formulated the assessment and plan and placed orders.  CRITICAL CARE: The patient is critically ill with multiple organ systems failure and requires high complexity decision making for assessment and support, frequent evaluation and titration of therapies, application of advanced monitoring technologies and extensive interpretation of multiple databases. Critical Care Time devoted to patient care services described in this note is 35 minutes.   Alyson Reedy, M.D. Pulmonary and Critical Care Medicine [facility] Pager: [phone]  08/02/2013, 11:27 AM

## 2013-08-03 DIAGNOSIS — F17200 Nicotine dependence, unspecified, uncomplicated: Secondary | ICD-10-CM

## 2013-08-03 LAB — BASIC METABOLIC PANEL
CO2: 27 mEq/L (ref 19–32)
Calcium: 8.3 mg/dL — ABNORMAL LOW (ref 8.4–10.5)
Creatinine, Ser: 0.58 mg/dL (ref 0.50–1.10)
GFR calc non Af Amer: >90 mL/min (ref >90)

## 2013-08-03 LAB — URINE MICROSCOPIC-ADD ON

## 2013-08-03 LAB — URINALYSIS, ROUTINE W REFLEX MICROSCOPIC
Bilirubin Urine: NEGATIVE
Nitrite: NEGATIVE
Specific Gravity, Urine: 1.011 (ref 1.005–1.030)
pH: 8 (ref 5.0–8.0)

## 2013-08-03 LAB — CBC
Hemoglobin: 9.4 g/dL — ABNORMAL LOW (ref 12.0–15.0)
MCH: 29.5 pg (ref 26.0–34.0)
MCH: 29.7 pg (ref 26.0–34.0)
MCHC: 34.7 g/dL (ref 30.0–36.0)
MCV: 85 fL (ref 78.0–100.0)
MCV: 85.2 fL (ref 78.0–100.0)
Platelets: 220 10*3/uL (ref 150–400)
Platelets: 221 10*3/uL (ref 150–400)
RBC: 3.39 MIL/uL — ABNORMAL LOW (ref 3.87–5.11)
RBC: 3.17 MIL/uL — ABNORMAL LOW (ref 3.87–5.11)
RBC: 3.37 MIL/uL — ABNORMAL LOW (ref 3.87–5.11)
RDW: 13.9 % (ref 11.5–15.5)
WBC: 12.1 10*3/uL — ABNORMAL HIGH (ref 4.0–10.5)

## 2013-08-03 LAB — TYPE AND SCREEN
ABO/RH(D): B POS
Unit division: 0
Unit division: 0

## 2013-08-03 LAB — GLUCOSE, CAPILLARY: Glucose-Capillary: 126 mg/dL — ABNORMAL HIGH (ref 70–99)

## 2013-08-03 LAB — MAGNESIUM: Magnesium: 2.3 mg/dL (ref 1.5–2.5)

## 2013-08-03 MED ORDER — NICOTINE 21 MG/24HR TD PT24: 21.0000 mg | MEDICATED_PATCH | Freq: Every day | TRANSDERMAL | Status: DC

## 2013-08-03 MED ORDER — NICOTINE 21 MG/24HR TD PT24
21.0000 mg | MEDICATED_PATCH | Freq: Every day | TRANSDERMAL | Status: DC
  Filled 2013-08-03: qty 1

## 2013-08-03 MED ORDER — INSULIN ASPART 100 UNIT/ML ~~LOC~~ SOLN
0.0000 [IU] | Freq: Three times a day (TID) | SUBCUTANEOUS | Status: DC
  Administered 2013-08-03: 1 [IU] via SUBCUTANEOUS

## 2013-08-03 MED ORDER — POTASSIUM CHLORIDE CRYS ER 20 MEQ PO TBCR
40.0000 meq | EXTENDED_RELEASE_TABLET | Freq: Three times a day (TID) | ORAL | Status: AC
  Administered 2013-08-03 (×2): 40 meq via ORAL
  Filled 2013-08-03 (×2): qty 2

## 2013-08-03 NOTE — Progress Notes (Signed)
  Date: 08/03/2013  Patient name: Whitney Wagner  Medical record number: [id number]  Date of birth: June 09, 1976   This patient has been seen and the plan of care was discussed with the house staff. Please see their note for complete details. I concur with their findings with the following additions/corrections:  I agree with plan and assessment as outlined by Dr. Manson Wagner. [name] serial CBC have stablelized where she has no longer required further transfusion. We will check her CBC in the morning and if still stable, will be ok to discharge. We will review with radiology if any need to drain large pelvic hematoma. She will maintain on telemetry to unsure no further arrhythmias. By tomorrow am, she will be off of vasopressors for 48 hrs.  Whitney Munson, MD 08/03/2013, 9:25 PM

## 2013-08-03 NOTE — Progress Notes (Signed)
**Patient Transfer Note**   Subjective: The patient is a 37 yo woman, history of PCOS, obesity, who presented 8/11 with PEA arrest.  The patient presented to [facility] 8/11 for an elective Laparoscopic Total Abdominal Hysterectomy, due to painful menses and heavy menstrual bleeding.  After insertion of surgical ports, and abdominal insufflation, but before the actual hysterectomy, the patient experienced PEA arrest, thought to be secondary to vagal response due to peritoneal insufflation.  CPR x3 rounds was performed (pt already intubated for surgery), with prompt ROSC.  Surgical instruments were removed, and surgical sites sutured closed.  Atropine was given during her code, and phenylephrine was started (discontinued 8/12).  Shortly after PEA arrest, the patient was breathing over the vent, lifting her head, and following commands, and she was subsequently extubated.  She was transferred to [facility] for ICU care.  Arctic Wynelle Link was not started, due to the brief time course of arrest.  Today, the patient notes feeling "much better".  She still notes some central chest pain on palpation, thought to be secondary to trauma from chest compressions.  She notes palpitations this hospitalization, with transient tachycardia, likely secondary to anemia/volume depletion, though she notes these are improving. She also notes mild abdominal pain, with minimal bruising, at the site of surgical scars.  She is tolerating a regular diet, and is passing gas, though without BM since admission.  Her Hb has been downtrending, from 14.9 pre-op 8/11, to 10.1 post-op 8/11, to 7.8 on 8/12.  CT abdomen 8/12 showed hemoperitoneum and a large hematoma at the midline pelvis.  The patient received 3 units pRBC's yesterday, and Hb this morning is stable at 9.4.  She had a foley placed for her surgery, which was removed yesterday, but the patient noted urinary retention yesterday evening, requiring temporary replacement of foley yesterday  evening (returned >1L urine), removed this morning (no urination since removal approx 1 hour ago).  The patient attributes her difficulty with urination to positional difficulties on her bedpan.  Objective: Vital signs in last 24 hours: Filed Vitals:   08/03/13 0800 08/03/13 0900 08/03/13 1000 08/03/13 1100  BP: 96/52 106/66 127/76 129/71  Pulse: 91 100 96 114  Temp:      TempSrc:      Resp: 18 19 20 25   Height:      Weight:      SpO2: 98% 95% 97% 94%   Weight change: 0.079 kg (2.8 oz)  Intake/Output Summary (Last 24 hours) at 08/03/13 1204 Last data filed at 08/03/13 1123  Gross per 24 hour  Intake 3351.67 ml  Output   6175 ml  Net -2823.33 ml   PEX General: alert, cooperative, sitting in chair in no acute distress HEENT: pupils equal round and reactive to light, vision grossly intact, oropharynx clear and non-erythematous  Neck: supple, no lymphadenopathy Lungs: clear to ascultation bilaterally, normal work of respiration, no wheezes, rales, ronchi Heart: regular rate and rhythm, no murmurs, gallops, or rubs, tenderness to sternal palpation Abdomen: soft, bruising noted at 3 laparoscopy incision sites, without erythema or drainage.  Mild tenderness to palpation of bilateral lower quadrants.  Non-distended, no guarding or rebound tenderness, normoactive bowel sounds Extremities: no cyanosis, clubbing, or edema Neurologic: alert & oriented X3, cranial nerves II-XII intact, strength grossly intact, sensation intact to light touch  Lab Results: Basic Metabolic Panel:  Recent Labs Lab 08/01/13 1424 08/03/13 0455  NA 134* 140  K 4.8 3.6  CL 104 105  CO2 20 27  GLUCOSE 197*  109*  BUN 10 7  CREATININE 0.68 0.58  CALCIUM 8.0* 8.3*  MG 1.7 2.3  PHOS 2.6 1.8*   Liver Function Tests:  Recent Labs Lab 08/01/13 0930 08/01/13 1424  AST 216* 209*  ALT 260* 254*  ALKPHOS 61 43  BILITOT 0.1* 0.2*  PROT 4.3* 5.2*  ALBUMIN 2.3* 2.7*   CBC:  Recent Labs Lab  08/03/13 0455 08/03/13 1012  WBC 13.1* 10.6*  HGB 10.0* 9.4*  HCT 28.8* 27.1*  MCV 85.0 85.5  PLT 220 208   Cardiac Enzymes:  Recent Labs Lab 08/01/13 0930 08/01/13 1424 08/01/13 2026 08/02/13 0130  CKTOTAL 75  --   --   --   CKMB 1.7  --   --   --   TROPONINI <0.30 <0.30 <0.30 <0.30   BNP:  Recent Labs Lab 08/01/13 1424  PROBNP 42.3   D-Dimer:  Recent Labs Lab 08/01/13 2026  DDIMER 1.53*   CBG:  Recent Labs Lab 08/02/13 1235 08/02/13 1627 08/02/13 1932 08/02/13 2342 08/03/13 0401 08/03/13 0747  GLUCAP 138* 131* 147* 102* 104* 95   Hemoglobin A1C:  Recent Labs Lab 08/01/13 1424  HGBA1C 5.4   Coagulation:  Recent Labs Lab 08/02/13 2238  LABPROT 13.6  INR 1.06   Studies/Results: Ct Abdomen Pelvis Wo Contrast  08/02/2013   *RADIOLOGY REPORT*  Clinical Data: Postop day #1 hysterectomy.  Anemia with acute drop in hemoglobin.  CT ABDOMEN AND PELVIS WITHOUT CONTRAST  Technique:  Multidetector CT imaging of the abdomen and pelvis was performed following the standard protocol without intravenous contrast.  Comparison: Four quadrant abdominal ultrasound yesterday.  Findings: Interval increase in size of the high-density ascites in the paracolic gutters of the abdomen and dependently in the pelvis since the ultrasound yesterday.  A more organized hematoma is present in the mid pelvis measuring approximately 10.19 x 9.3 x 8.7 cm (series 2, image 87 and coronal image 78).  Urinary bladder unremarkable.  Edema in the subcutaneous fat of the anterior abdominal wall above the umbilicus and in the left lateral abdominal wall, presumably post surgical or related to subcutaneous injections.  Diffuse hepatic steatosis with focal sparing surrounding the gallbladder.  Within the limits of the unenhanced technique, no focal hepatic parenchymal abnormality.  Normal unenhanced appearance of the spleen, pancreas, right adrenal glands, and both kidneys.  Approximate 1.6 x 1.4 cm  low attenuation nodule arising from the left adrenal gland. Gallbladder unremarkable by CT.  No biliary ductal dilation.  No visible aorto-iliac atherosclerosis. No significant lymphadenopathy.  Stomach, small bowel, and colon normal in appearance.  Normal- appearing short decompressed appendix in the right mid abdomen, as the cecum is positioned in the right upper quadrant.  Small right pleural effusion and atelectasis in both lower lobes, left greater than right.  Very small pericardial effusion versus pericardial thickening.  Heart size normal.  Bone window images demonstrate mild facet degenerative changes involving the lower lumbar spine.  IMPRESSION:  1.  Hemoperitoneum and large hematoma in the midline of the pelvis. The amount of intraperitoneal blood has increased since the for quadrant ultrasound performed yesterday. 2.  Small right pleural effusion.  Atelectasis in the lower lobes, right greater than left. 3.  Small pericardial effusion versus pericardial thickening. 4.  Diffuse hepatic steatosis with focal sparing surrounding the gallbladder.  No focal hepatic parenchymal abnormality. 5.  Small left adrenal nodule statistically consistent with adenoma.  These results will be called to the ordering clinician or representative by the Radiologist Assistant,  and communication documented in the PACS Dashboard.   Original Report Authenticated By: Hulan Saas, M.D.   Dg Chest Port 1 View  08/01/2013   *RADIOLOGY REPORT*  Clinical Data: Chest pain, rule out rib fracture left side  PORTABLE CHEST - 1 VIEW  Comparison: 05/23/2011  Findings: Heart size and vascular pattern normal.  Lungs clear.  No pneumothorax or effusion.  Bony thorax grossly intact.  IMPRESSION: Single AP view cannot exclude rib fracture.  Allowing for this limitation, study is normal.   Original Report Authenticated By: Esperanza Heir, M.D.   Medications: I have reviewed the patient's current medications. Scheduled Meds: . famotidine   20 mg Oral BID  . insulin aspart  2-6 Units Subcutaneous Q4H  . nicotine  21 mg Transdermal Q0600  . potassium chloride  40 mEq Oral TID   Continuous Infusions: . sodium chloride 100 mL/hr at 08/03/13 0430  . phenylephrine (NEO-SYNEPHRINE) Adult infusion Stopped (08/02/13 0530)   PRN Meds:.sodium chloride, oxyCODONE-acetaminophen, zolpidem  Assessment/Plan: The patient is a 37 yo woman, who was transferred to [facility] 8/11 after PEA arrest during elective TAH, now recovering.  # PEA arrest - Resolved. resolved quickly with CPR.  Phenylephrine started briefly.  Pt extubated soon after arrest on 8/11.  Patient now off all pressors, extubated.  Etiology was thought to be due to vagal response from abdominal insufflation. -transfer to telemetry today -PT/OT consulted for deconditioning/weakness s/p PEA arrest  # Acute Blood Loss Anemia - The patient's Hb has dropped from 14.9 on 8/11, to a minimum of 7.8 on 8/12, now 9.4 on 8/13 after 3 units pRBC's on 8/12.  CT abdomen showed hemoperitoneum and large hematoma, likely accounting for bleed. -GYN consulted, appreciate recs -per GYN, likely not actively bleeding.  Will watch for now, and if Hb continues to drop, pt may require laparotomy, though patient is a poor surgical candidate -check H&H q6hrs  # Urinary retention - pt notes no prior difficulty with urination prior to hospitalization.  Differential includes positional factors (per pt belief) vs mild foley trauma vs UTI.  Foley removed the morning of 8/13. -pt may have bathroom privileges, up with assistance -check bladder scan this evening if no spontaneous urination today -check UA  # Sinus Tachycardia - pt notes mild palpitations over the last few days, with tachycardia up to the 110's.  Symptoms are improving.  Likely represents anemia/volume depletion.  S/p 3 U pRBC's 8/12. -continue to monitor via telemetry -pt tolerating regular diet.  Will decrease NS from 100 cc/hr to 50 cc/hr today,  and can likely discontinue tomorrow  # Hyperglycemia - the patient has had mild hyperglycemia this admission, (total daily insulin usage = 16 units on 8/12).  She has no history of DM, with an A1C = 5.4 (08/01/13), but may have some insulin resistance in the setting of obesity/PCOS. -continue SSI sensitive with meals  # Transaminitis - LFT's normal in June 2014.  Acute elevation likely due to hypoperfusion ("shock liver") -check CMET in AM to ensure LFT's downtrending  # GERD - chronic, stable -continue pepcid  # Tobacco abuse - pt quit 1 month ago, using nicotine patches -continue nicotine patches  # Possible OSA - pt may benefit from outpatient sleep study  # Prophy - SCD's (due to anemia, risk of bleeding)  Dispo: Disposition is deferred at this time, awaiting improvement of current medical problems.  Anticipated discharge in approximately 1-3 day(s).   The patient does have a current PCP Windell Hummingbird, MD) and  does need an [facility] hospital follow-up appointment after discharge.  .Services Needed at time of discharge: Y = Yes, Blank = No PT:   OT:   RN:   Equipment:   Other:     LOS: 2 days   Linward Headland, MD 08/03/2013, 12:04 PM

## 2013-08-03 NOTE — Progress Notes (Signed)
Pt wants to leave AMA.  Discussed current hematoma with patient and risks.  Verbalized understanding, still expressing desire to leave.  Denies pain, states "everything is fine here on this unit with you girls, I have seen 4 doctors today and it seems like they don't know anything."  NAD, pt given AMA paper and signed.

## 2013-08-03 NOTE — Progress Notes (Signed)
*   No surgery found * POD #2 s/p procedure NOT complete after trocars placed (laparoscopy)  Subjective: Patient reports tolerating PO and + flatus.  Pt with problems voiding last night possibly due urinary retention from meds.  Foley removed today and pt is voiding without difficulty.  She reports still feeling a little weak but, denies dizziness.  Her pain is improved.  Still c/o some pain at the umbilicus.  She is tolerating a regular diet and denies N/V.   Objective: I have reviewed patient's vital signs, intake and output, medications and labs.  General: alert and no distress Resp: clear to auscultation bilaterally Cardio: regular rate and rhythm, S1, S2 normal, no murmur, click, rub or gallop GI: soft, non-tender; bowel sounds normal; no masses,  no organomegaly and incision: clean, dry and intact  CBC    Component Value Date/Time   WBC 10.6* 08/03/2013 1012   RBC 3.17* 08/03/2013 1012   HGB 9.4* 08/03/2013 1012   HCT 27.1* 08/03/2013 1012   PLT 208 08/03/2013 1012   MCV 85.5 08/03/2013 1012   MCH 29.7 08/03/2013 1012   MCHC 34.7 08/03/2013 1012   RDW 13.9 08/03/2013 1012   LYMPHSABS 2.7 05/26/2013 2000   MONOABS 0.6 05/26/2013 2000   EOSABS 0.3 05/26/2013 2000   BASOSABS 0.1 05/26/2013 2000     Assessment: POD# 2 s/p lararoscopy (ports placed only)  Surgery aborted due to cardiac event which appear to have been a pronounced vagal response.  Cardiology has signed off at present  She received 3 units of PRBCs yesterday. Her hct is now stable on the last 2 draws.  Has a hct pending for 4pm       Plan: Encourage ambulation Diet advanced this am From a gyn standpoint if her 4pm hct is stable pt is ok for discharge.  The hospitalist can determine discharge for her other conditions. Pt needs to f/u with me in 2 weeks.  Our ofc will make her an appt.   LOS: 2 days    HARRAWAY-SMITH, Cedar Ditullio 08/03/2013, 3:40 PM

## 2013-08-03 NOTE — Progress Notes (Signed)
Report called to [facility] on [address].  Informed her of need for PIV and called IV Team d/t patient being a difficult stick.

## 2013-08-03 NOTE — Progress Notes (Signed)
IV catheter removed, tip intact.  Monitor taken off.  Pt states she has all belongings.  Denies complaints, pt left with friend.

## 2013-08-03 NOTE — Progress Notes (Signed)
PULMONARY  / CRITICAL CARE MEDICINE  Name: Whitney Wagner MRN: [id number] DOB: 15-Dec-1976    ADMISSION DATE:  08/01/2013 CONSULTATION DATE:  8/11  REFERRING MD :  Dr. Dolan Amen   PRIMARY SERVICE: PCCM   CHIEF COMPLAINT:  Cardiac arrest  BRIEF PATIENT DESCRIPTION:  37yo obese female with hx PCOS, GERD admitted 8/11 to [facility] hospital for TAH. During the case, she developed PEA arrest though r/t vagal in setting peritoneal insufflation. Required CPR x 3 with prompt ROSC. She was awake and alert post arrest and was extubated. Tx to [facility] under PCCM for further eval/mgmt.    SIGNIFICANT EVENTS / STUDIES:  8/11 PEA arrest    LINES / TUBES:   CULTURES:  ANTIBIOTICS:   HISTORY OF PRESENT ILLNESS:    37yo obese female with hx PCOS, GERD admitted 8/11 to [facility] hospital for TAH. During the case, she developed PEA arrest though r/t vagal in setting peritoneal insufflation. Required CPR x 3 with prompt ROSC. She was awake and alert post arrest and was extubated. Tx to [facility] under PCCM for further eval/mgmt.  PAST MEDICAL HISTORY :  Past Medical History  Diagnosis Date  . PCOS (polycystic ovarian syndrome)   . Osgood-Schlatter's disease of left knee     left knee  . Uterine fibroid   . Arthritis     "beginnings in my hands" (05/26/2013)  . Tooth pain 05/26/2013    "bottom right" (05/26/2013)  . Seasonal allergies   . GERD (gastroesophageal reflux disease)     otc meds prn  . Migraines, neuralgic     '~ 3/week" (05/26/2013) - otc meds prn  . Morbid obesity    Past Surgical History  Procedure Laterality Date  . External fixation arm Right 1980's  . S/p multiple extractions with alveoloplasty  06/01/2013    Extraction of tooth #'s 5,12,30 with alveoloplasty-Dr. Kristin Bruins  . Wisdom tooth extraction    . Robotic assisted total hysterectomy N/A 08/01/2013    Procedure: ROBOTIC ASSISTED TOTAL HYSTERECTOMY;  Surgeon: Willodean Rosenthal, MD;  Location: [facility] OR[facility];  Service: Gynecology;   Laterality: N/A;   Prior to Admission medications   Medication Sig Start Date End Date Taking? Authorizing Provider  nicotine (NICODERM CQ - DOSED IN MG/24 HOURS) 21 mg/24hr patch Place 1 patch onto the skin daily.   Yes Historical Provider, MD   Allergies  Allergen Reactions  . Codeine Other (See Comments)    Codeine liquid iv  drip only as a child - caused hallucinations..  Can tolerate percocet, vicodin      FAMILY HISTORY:  Family History  Problem Relation Age of Onset  . Diabetes Brother   . Hyperlipidemia Brother   . Fibromyalgia Mother   . Cancer Maternal Aunt     breast cancer   SOCIAL HISTORY:  reports that she quit smoking about 3 weeks ago. Her smoking use included Cigarettes. She has a 21 pack-year smoking history. She quit smokeless tobacco use about 4 weeks ago. She reports that she does not drink alcohol or use illicit drugs.  REVIEW OF SYSTEMS:    remains drowsy. Primary c/o is pleuritic type PC with deep breath, abd discomfort and thirst. She is not able to participate in complete ROS at this point   SUBJECTIVE:  Arrived to ICU. Sleepy but oriented. Working on titration of Neo.  VITAL SIGNS: Temp:  [98 F (36.7 C)-99.4 F (37.4 C)] 98.5 F (36.9 C) (08/13 0730) Pulse Rate:  [91-123] 100 (08/13 0900) Resp:  [16-33]  19 (08/13 0900) BP: (96-132)/(45-85) 106/66 mmHg (08/13 0900) SpO2:  [89 %-100 %] 95 % (08/13 0900) Weight:  [122.879 kg (270 lb 14.4 oz)] 122.879 kg (270 lb 14.4 oz) (08/13 0445) HEMODYNAMICS:   VENTILATOR SETTINGS:   INTAKE / OUTPUT: Intake/Output     08/12 0701 - 08/13 0700 08/13 0701 - 08/14 0700   P.O. 960 425   I.V. (mL/kg) 1495 (12.2) 200 (1.6)   Blood 1001.7    IV Piggyback 50    Total Intake(mL/kg) 3506.7 (28.5) 625 (5.1)   Urine (mL/kg/hr) 5825 (2) 325 (0.8)   Blood     Total Output 5825 325   Net -2318.3 +300         PHYSICAL EXAMINATION: General:  Obese white female. Alert and interactive, following command.  Neuro:   Alert and oriented HEENT:  MM are dry, no JVD  Cardiovascular:  rrr w/out MRG Lungs:  Clear, even, no accessory muscle use. Some pain to palpation of sternum  Abdomen:  Distended, hypoactive bowel sounds. Small incisions mid-line intact.  Tender to palpation. Musculoskeletal:  Intact. Skin:  Warm, good pulses   LABS:  CBC Recent Labs     08/01/13  2027  08/02/13  0440  08/02/13  2238  08/03/13  0455  WBC  22.0*  17.1*   --   13.1*  HGB  9.7*  7.8*  9.6*  10.0*  HCT  27.6*  22.0*  27.2*  28.8*  PLT  373  287   --   220   Coag's Recent Labs     08/01/13  2026  08/02/13  2238  APTT  23*   --   INR   --   1.06   BMET Recent Labs     08/01/13  0930  08/01/13  1424  08/03/13  0455  NA  132*  134*  140  K  4.2  4.8  3.6  CL  100  104  105  CO2  18*  20  27  BUN  9  10  7   CREATININE  0.85  0.68  0.58  GLUCOSE  368*  197*  109*   Electrolytes Recent Labs     08/01/13  0930  08/01/13  1424  08/03/13  0455  CALCIUM  8.1*  8.0*  8.3*  MG   --   1.7  2.3  PHOS   --   2.6  1.8*   Sepsis Markers No results found for this basename: LACTICACIDVEN, PROCALCITON, O2SATVEN,  in the last 72 hours ABG No results found for this basename: PHART, PCO2ART, PO2ART,  in the last 72 hours Liver Enzymes Recent Labs     08/01/13  0930  08/01/13  1424  AST  216*  209*  ALT  260*  254*  ALKPHOS  61  43  BILITOT  0.1*  0.2*  ALBUMIN  2.3*  2.7*   Cardiac Enzymes Recent Labs     08/01/13  0930  08/01/13  1424  08/01/13  2026  08/02/13  0130  TROPONINI  <0.30  <0.30  <0.30  <0.30  PROBNP   --   42.3   --    --    Glucose Recent Labs     08/02/13  1235  08/02/13  1627  08/02/13  1932  08/02/13  2342  08/03/13  0401  08/03/13  0747  GLUCAP  138*  131*  147*  102*  104*  95    Imaging Ct  Abdomen Pelvis Wo Contrast  08/02/2013   *RADIOLOGY REPORT*  Clinical Data: Postop day #1 hysterectomy.  Anemia with acute drop in hemoglobin.  CT ABDOMEN AND PELVIS WITHOUT  CONTRAST  Technique:  Multidetector CT imaging of the abdomen and pelvis was performed following the standard protocol without intravenous contrast.  Comparison: Four quadrant abdominal ultrasound yesterday.  Findings: Interval increase in size of the high-density ascites in the paracolic gutters of the abdomen and dependently in the pelvis since the ultrasound yesterday.  A more organized hematoma is present in the mid pelvis measuring approximately 10.19 x 9.3 x 8.7 cm (series 2, image 87 and coronal image 78).  Urinary bladder unremarkable.  Edema in the subcutaneous fat of the anterior abdominal wall above the umbilicus and in the left lateral abdominal wall, presumably post surgical or related to subcutaneous injections.  Diffuse hepatic steatosis with focal sparing surrounding the gallbladder.  Within the limits of the unenhanced technique, no focal hepatic parenchymal abnormality.  Normal unenhanced appearance of the spleen, pancreas, right adrenal glands, and both kidneys.  Approximate 1.6 x 1.4 cm low attenuation nodule arising from the left adrenal gland. Gallbladder unremarkable by CT.  No biliary ductal dilation.  No visible aorto-iliac atherosclerosis. No significant lymphadenopathy.  Stomach, small bowel, and colon normal in appearance.  Normal- appearing short decompressed appendix in the right mid abdomen, as the cecum is positioned in the right upper quadrant.  Small right pleural effusion and atelectasis in both lower lobes, left greater than right.  Very small pericardial effusion versus pericardial thickening.  Heart size normal.  Bone window images demonstrate mild facet degenerative changes involving the lower lumbar spine.  IMPRESSION:  1.  Hemoperitoneum and large hematoma in the midline of the pelvis. The amount of intraperitoneal blood has increased since the for quadrant ultrasound performed yesterday. 2.  Small right pleural effusion.  Atelectasis in the lower lobes, right greater than  left. 3.  Small pericardial effusion versus pericardial thickening. 4.  Diffuse hepatic steatosis with focal sparing surrounding the gallbladder.  No focal hepatic parenchymal abnormality. 5.  Small left adrenal nodule statistically consistent with adenoma.  These results will be called to the ordering clinician or representative by the Radiologist Assistant, and communication documented in the PACS Dashboard.   Original Report Authenticated By: Hulan Saas, M.D.   Dg Chest Port 1 View  08/01/2013   *RADIOLOGY REPORT*  Clinical Data: Chest pain, rule out rib fracture left side  PORTABLE CHEST - 1 VIEW  Comparison: 05/23/2011  Findings: Heart size and vascular pattern normal.  Lungs clear.  No pneumothorax or effusion.  Bony thorax grossly intact.  IMPRESSION: Single AP view cannot exclude rib fracture.  Allowing for this limitation, study is normal.   Original Report Authenticated By: Esperanza Heir, M.D.   CXR:   ASSESSMENT / PLAN:  PULMONARY A:  Resolved acute resp failure s/p PEA arrest.  Chest wall pain.  C/o sternal pain. And pain w/ deep breath. Suspect this is d/t CPR.  At risk for hypoxia, appears to have OSA anatomy.  P:   - Wean FIO2 for sat of 92-95%, hopefully off O2 prior to discharge. - Ck 75, CXR noted, no evidence of rib fracture but not adequate for fracture ID but clinically patient is stable.  CARDIOVASCULAR A:  PEA arrest - though r/t vagal in setting peritoneal insufflation. Loss of pulse x 3 with prompt ROSC. No hypothermia protocol given prompt ROSC and normal mental status post arrest.  Hypotension  P:  - CBC now. - Ck 79, cortisol 27.9, trop negative.  RENAL Hyponatremia  P:  - F/u chem. - KVO IVF. - Replace electrolytes as indicated.  GASTROINTESTINAL Post-op abd pain s/p insufflation  P:  - Maintain NPO. - Abd/pelvic CT with evidence of blood. - GYN consulted and are to come see patient.  HEMATOLOGIC A:  Mild anemia. Hgb drop is concerning  from 14.9 to 10.1. She did not complete surgery so would favor hemodilution over acute blood loss anemia  P:  - SCD's for dvt proph  - Surgical procedure not completed but was started - no anticoagulation for now - Recheck H&H q6 hours.  INFECTIOUS No obvious source infection  P:  - Monitor fever, wbc curve.  ENDOCRINE Hyperglycemia - no reported hx DM, glucose >300  P:  - SSI.  NEUROLOGIC No active issue  P:  Monitor post arrest  GYN  Uterine fibroids - for TAH, procedure aborted prior to completion. Blood in abdomen on CT.  Urinary retention overnight,  P:  - GYN input appreciated. - Will d/c foley and give patient bathroom privileges, GYN to address.  TODAY'S SUMMARY:   37 year old female admitted s/p PEA arrest. Transfer to tele, d/c foley, pt/ot and transfer care to [facility], PCCM will sign off, please call back if needed.  I have personally obtained a history, examined the patient, evaluated laboratory and imaging results, formulated the assessment and plan and placed orders.  Alyson Reedy, M.D. Pulmonary and Critical Care Medicine [facility] Pager: [phone]  08/03/2013, 10:23 AM

## 2013-08-04 ENCOUNTER — Telehealth: Payer: Self-pay | Admitting: *Deleted

## 2013-08-04 DIAGNOSIS — R109 Unspecified abdominal pain: Secondary | ICD-10-CM

## 2013-08-04 MED ORDER — TRAMADOL HCL 50 MG PO TABS: 50.0000 mg | ORAL_TABLET | Freq: Four times a day (QID) | ORAL | Status: DC | PRN

## 2013-08-04 NOTE — Telephone Encounter (Signed)
Patient request reviewed by Dr. Erin Fulling and prescription sent to pharmacy. Called patient and left a message that we are calling back, prescription was sent to your pharmacy - please keep your already scheduled appointment- call back if you have any further questions or needs

## 2013-08-04 NOTE — Telephone Encounter (Addendum)
Patient left a message stating that she left [facility] hospital last night AMA. She felt that the doctors there didn't know what to do for her.  She states that she would like to get an rx for pain medicine. She would like to speak to Dr. Erin Fulling or a nurse about what happened and to see about getting a prescription.

## 2013-08-05 IMAGING — US US PELVIS COMPLETE
1 series · 14 of 25 positions shown · non-contrast
Comparison: Ultrasound dated 11/16/2007

CLINICAL DATA: Lower abdominal pain.  History of fibroids.

TRANSABDOMINAL AND TRANSVAGINAL ULTRASOUND OF PELVIS
TECHNIQUE: Both transabdominal and transvaginal ultrasound
examinations of the pelvis were performed. Transabdominal technique
was performed for global imaging of the pelvis including uterus,
ovaries, adnexal regions, and pelvic cul-de-sac.
It was necessary to proceed with endovaginal exam following the
transabdominal exam to visualize the uterus, endometrium, ovaries,
and adnexa.

[Series 1: us pelvis complete · 14 of 48 slices shown]
[im 1/48]
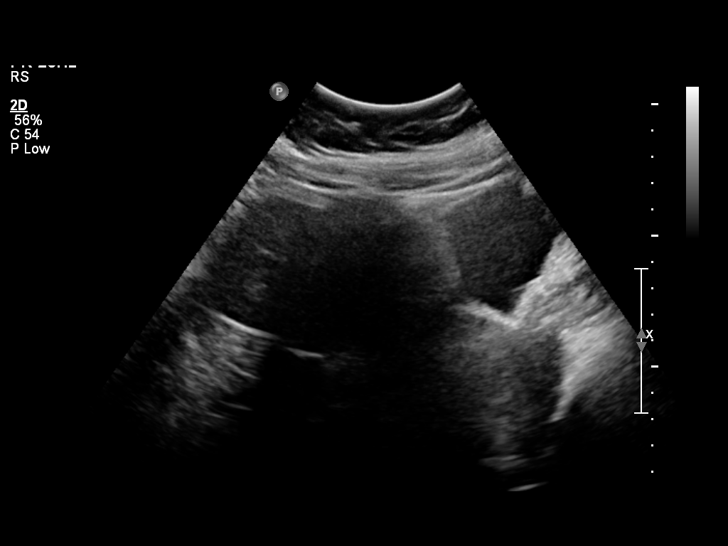
[im 4/48]
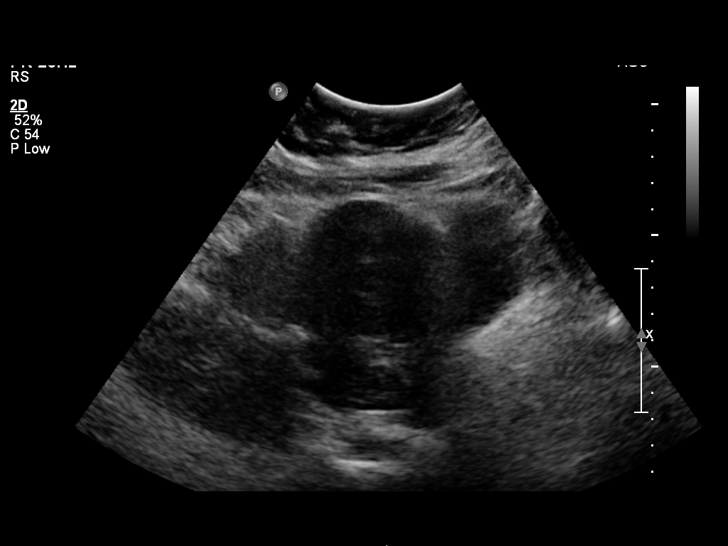
[im 8/48]
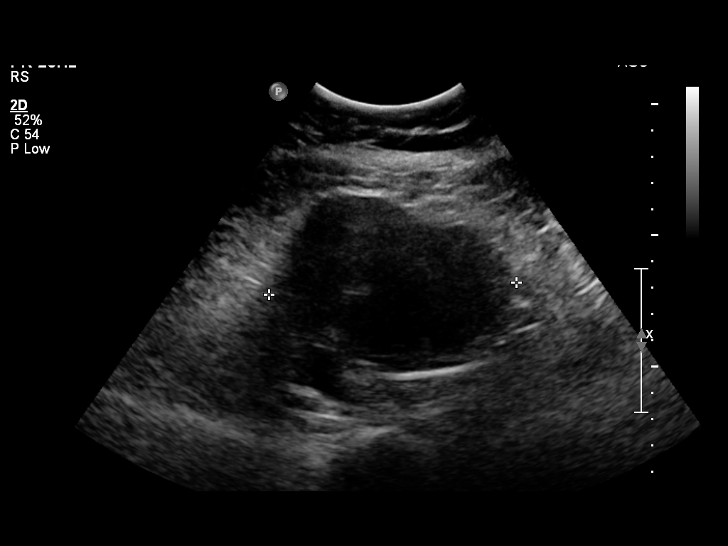
[im 12/48]
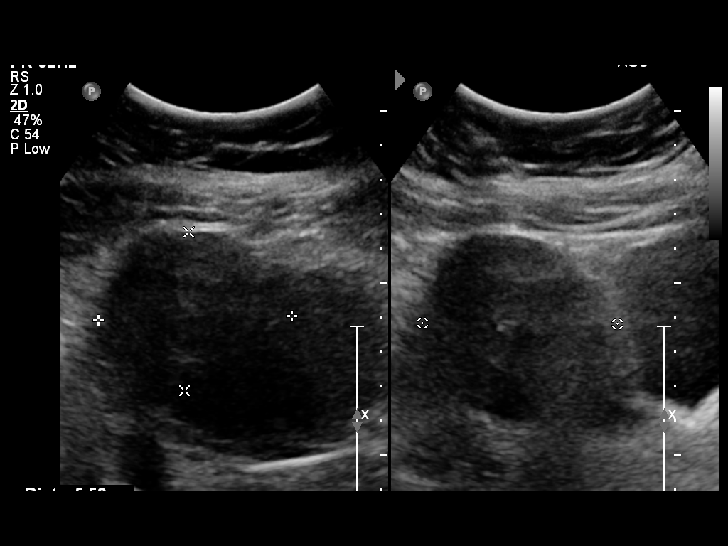
[im 16/48]
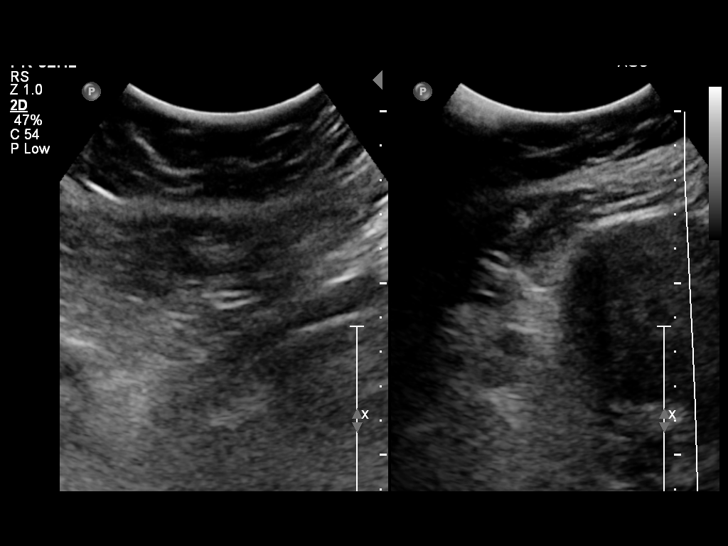
[im 18/48]
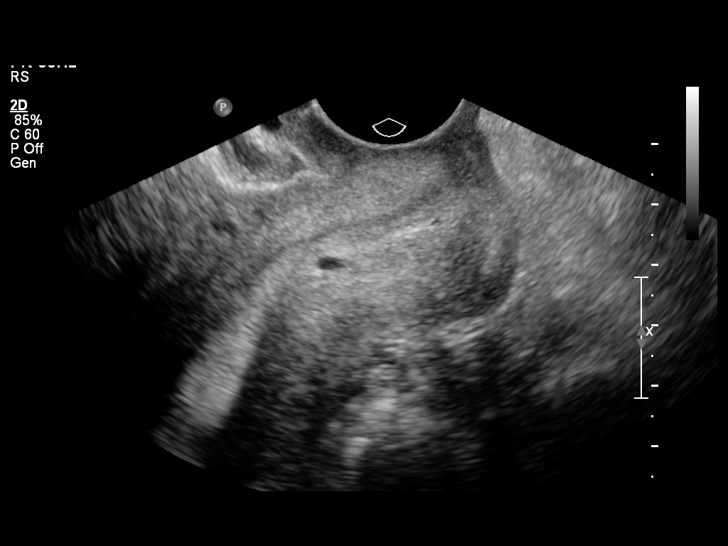
[im 22/48]
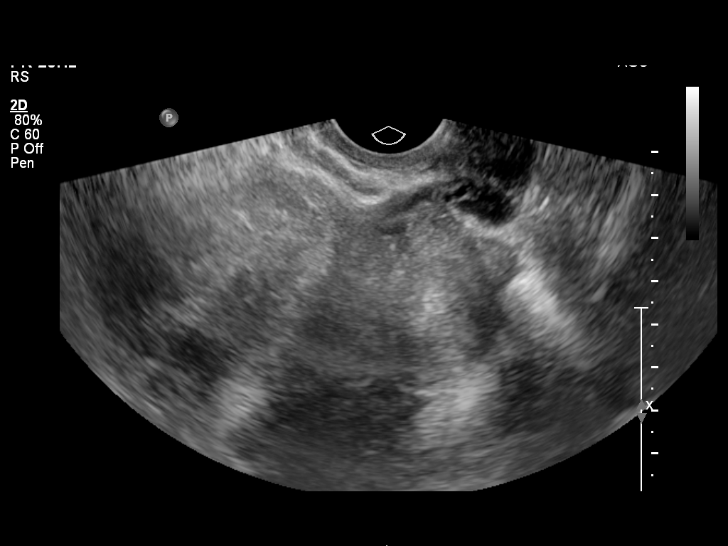
[im 26/48]
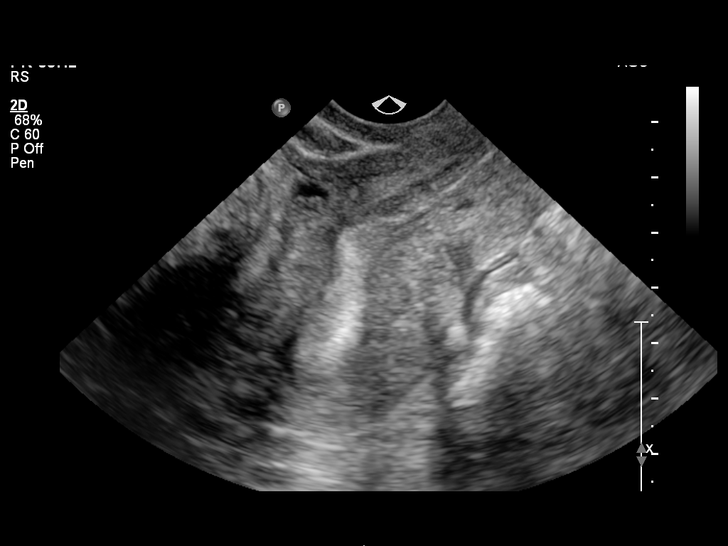
[im 30/48]
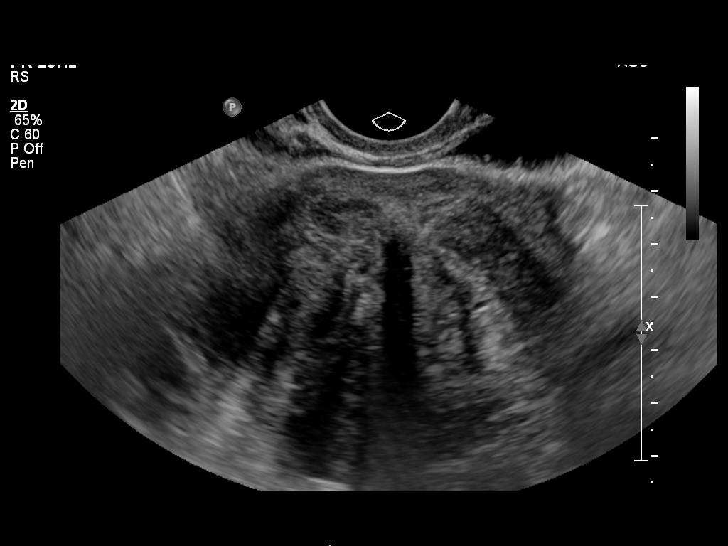
[im 32/48]
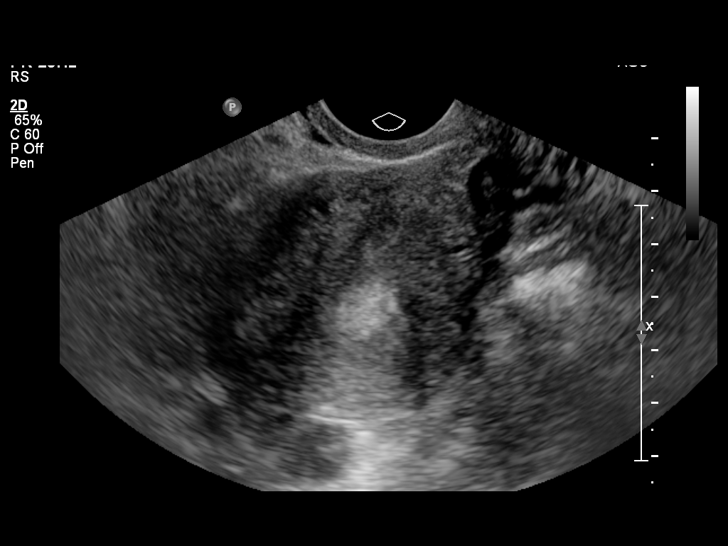
[im 36/48]
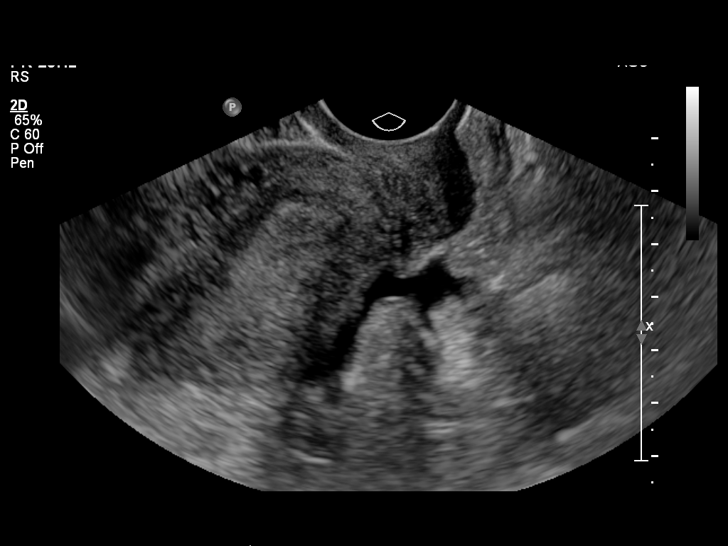
[im 40/48]
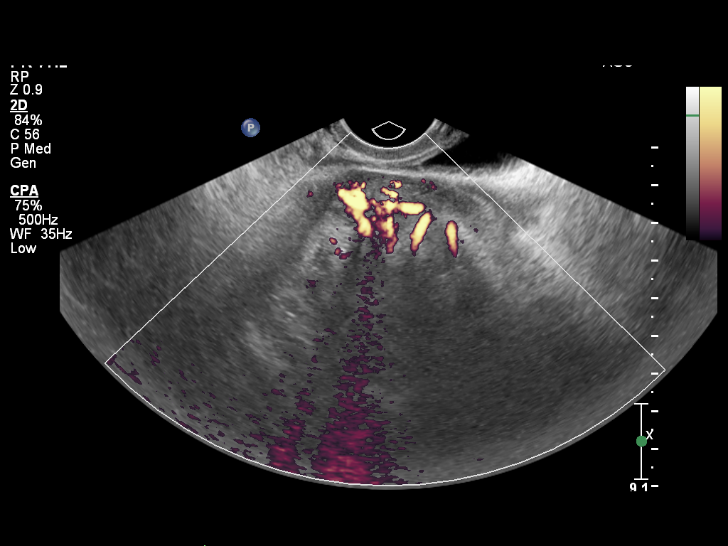
[im 44/48]
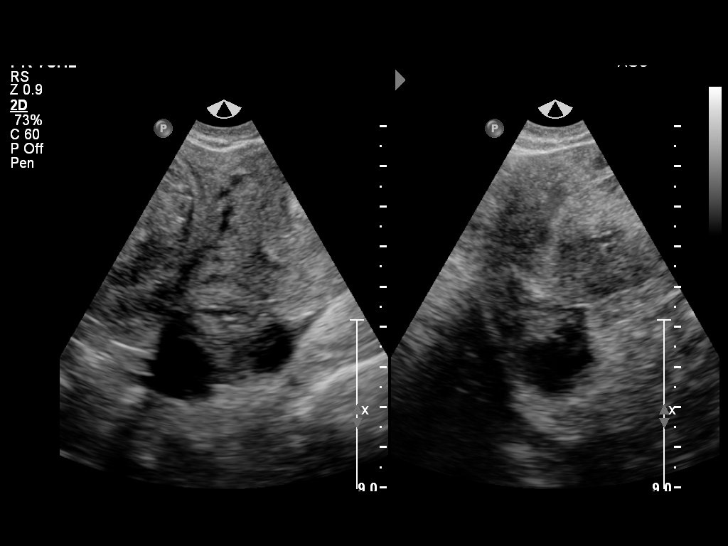
[im 48/48]
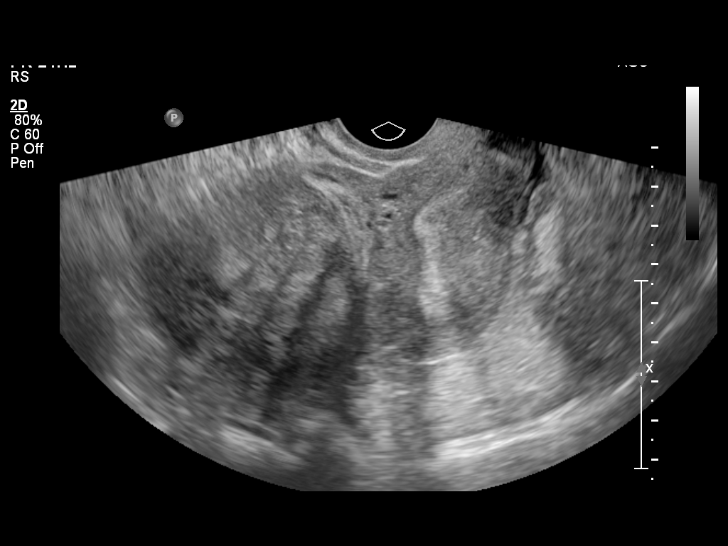

[14 of 25 positions shown; findings below may reference images not displayed]

FINDINGS: Uterus: 11.1 x 6.3 x 7.2 cm.  There is a 5.6 94.6 x 5.6 cm fibroid
in the uterine fundus.  There is also a 6.0 x 5.3 x 5.7 cm fibroid
in the lower uterine segment.

Endometrium: Normal.  11.1 cm in thickness.

Right ovary:  Normal.  4.1 x 2.2 x 2.4 cm.

Left ovary: Normal.  2.7 x 2.4 x 1.8 cm.

Other findings: Trace free fluid.
IMPRESSION: The patient now has two large fibroids in the uterus, a marked
change since the prior exam.

## 2013-08-05 NOTE — Discharge Summary (Signed)
Patient Name:  Whitney Wagner  MRN: [id number]  PCP: Windell Hummingbird, MD  DOB:  June 23, 1976       Date of Admission:  08/01/2013  Date of Discharge:  08/03/2013      Attending Physician: Dr. Drue Second    DISCHARGE DIAGNOSES: 1. Principal Problem: 2.   Cardiac arrest 3. Active Problems: 4.   Intraperitoneal bleeding 5.   Hemorrhagic shock 6.    DISPOSITION AND FOLLOW-UP: patient left hospital on AMA. No follow up appointment was made in clinic.    Future Appointments Provider Department Dept Phone   08/25/2013 1:45 PM Willodean Rosenthal, MD [facility] [phone]   08/31/2013 2:45 PM Allie Bossier, MD [facility] [phone]      DISCHARGE MEDICATIONS:   Medication List    ASK your doctor about these medications       nicotine 21 mg/24hr patch  Commonly known as:  NICODERM CQ - dosed in mg/24 hours  Place 1 patch onto the skin daily.        CONSULTS: PCCM and Ob/Gyn    PROCEDURES PERFORMED:  Ct Abdomen Pelvis Wo Contrast  08/02/2013   *RADIOLOGY REPORT*  Clinical Data: Postop day #1 hysterectomy.  Anemia with acute drop in hemoglobin.  CT ABDOMEN AND PELVIS WITHOUT CONTRAST  Technique:  Multidetector CT imaging of the abdomen and pelvis was performed following the standard protocol without intravenous contrast.  Comparison: Four quadrant abdominal ultrasound yesterday.  Findings: Interval increase in size of the high-density ascites in the paracolic gutters of the abdomen and dependently in the pelvis since the ultrasound yesterday.  A more organized hematoma is present in the mid pelvis measuring approximately 10.19 x 9.3 x 8.7 cm (series 2, image 87 and coronal image 78).  Urinary bladder unremarkable.  Edema in the subcutaneous fat of the anterior abdominal wall above the umbilicus and in the left lateral abdominal wall, presumably post surgical or related to subcutaneous injections.  Diffuse hepatic steatosis with focal sparing surrounding  the gallbladder.  Within the limits of the unenhanced technique, no focal hepatic parenchymal abnormality.  Normal unenhanced appearance of the spleen, pancreas, right adrenal glands, and both kidneys.  Approximate 1.6 x 1.4 cm low attenuation nodule arising from the left adrenal gland. Gallbladder unremarkable by CT.  No biliary ductal dilation.  No visible aorto-iliac atherosclerosis. No significant lymphadenopathy.  Stomach, small bowel, and colon normal in appearance.  Normal- appearing short decompressed appendix in the right mid abdomen, as the cecum is positioned in the right upper quadrant.  Small right pleural effusion and atelectasis in both lower lobes, left greater than right.  Very small pericardial effusion versus pericardial thickening.  Heart size normal.  Bone window images demonstrate mild facet degenerative changes involving the lower lumbar spine.  IMPRESSION:  1.  Hemoperitoneum and large hematoma in the midline of the pelvis. The amount of intraperitoneal blood has increased since the for quadrant ultrasound performed yesterday. 2.  Small right pleural effusion.  Atelectasis in the lower lobes, right greater than left. 3.  Small pericardial effusion versus pericardial thickening. 4.  Diffuse hepatic steatosis with focal sparing surrounding the gallbladder.  No focal hepatic parenchymal abnormality. 5.  Small left adrenal nodule statistically consistent with adenoma.  These results will be called to the ordering clinician or representative by the Radiologist Assistant, and communication documented in the PACS Dashboard.   Original Report Authenticated By: Hulan Saas, M.D.   [place] Intraoperative  08/01/2013   CLINICAL  DATA: visualization   Ultrasound was provided for use by the ordering physician, and a technical  charge was applied by the performing facility.  No radiologist  interpretation/professional services rendered.    Dg Chest Port 1 View  08/01/2013   *RADIOLOGY REPORT*  Clinical  Data: Chest pain, rule out rib fracture left side  PORTABLE CHEST - 1 VIEW  Comparison: 05/23/2011  Findings: Heart size and vascular pattern normal.  Lungs clear.  No pneumothorax or effusion.  Bony thorax grossly intact.  IMPRESSION: Single AP view cannot exclude rib fracture.  Allowing for this limitation, study is normal.   Original Report Authenticated By: Esperanza Heir, M.D.      ADMISSION DATA: H&P: The patient is a 37 yo woman, history of PCOS, obesity, who presented 8/11 with PEA arrest.  The patient presented to [facility] 8/11 for an elective Laparoscopic Total Abdominal Hysterectomy, due to painful menses and heavy menstrual bleeding.  After insertion of surgical ports, and abdominal insufflation, but before the actual hysterectomy, the patient experienced PEA arrest, thought to be secondary to vagal response due to peritoneal insufflation.  CPR x3 rounds was performed (pt already intubated for surgery), with prompt ROSC.  Surgical instruments were removed, and surgical sites sutured closed.  Atropine was given during her code, and phenylephrine was started (discontinued 8/12).  Shortly after PEA arrest, the patient was breathing over the vent, lifting her head, and following commands, and she was subsequently extubated.  She was transferred to [facility] for ICU care.  Arctic Wynelle Link was not started, due to the brief time course of arrest.  The patient received 3 units pRBC's yesterday, and Hb this morning is stable at 9.4.   On 8/13,  the patient notes feeling "much better".  She still notes some central chest pain on palpation, thought to be secondary to trauma from chest compressions.  She notes palpitations this hospitalization, with transient tachycardia, likely secondary to anemia/volume depletion, though she notes these are improving. She also notes mild abdominal pain, with minimal bruising, at the site of surgical scars. patient was transferred from ICU to our service (IMTP) on 08/03/13.    Physical Exam: CBC  Recent Labs Lab  08/01/13 0630  08/01/13 0930  08/01/13 1424   HGB  14.9  10.1*  9.9*   HCT  42.6  29.8*  28.0*   WBC  9.4  11.3*  28.3*   PLT  292  347  353     COAGULATION No results found for this basename: INR,  in the last 168 hours  CARDIAC   Recent Labs Lab  08/01/13 0930  08/01/13 1424   TROPONINI  <0.30  <0.30     Recent Labs Lab  08/01/13 1424   PROBNP  42.3      CHEMISTRY  Recent Labs Lab  08/01/13 0930  08/01/13 1424   NA  132*  134*   K  4.2  4.8   CL  100  104   CO2  18*  20   GLUCOSE  368*  197*   BUN  9  10   CREATININE  0.85  0.68   CALCIUM  8.1*  8.0*   MG   --   1.7   PHOS   --   2.6    Estimated Creatinine Clearance: 120.2 ml/min (by C-G formula based on Cr of 0.68).   LIVER  Recent Labs Lab  08/01/13 0930  08/01/13 1424   AST  216*  209*  ALT  260*  254*   ALKPHOS  61  43   BILITOT  0.1*  0.2*   PROT  4.3*  5.2*   ALBUMIN  2.3*  2.7*      INFECTIOUS  Recent Labs Lab  08/01/13 1425   LATICACIDVEN  4.0*      ENDOCRINE CBG (last 3)    Recent Labs   08/01/13 1033  08/01/13 1222  08/01/13 1622   GLUCAP  255*  174*  175*     CBC  Recent Labs Lab  08/01/13 0630  08/01/13 0930  08/01/13 1424   HGB  14.9  10.1*  9.9*   HCT  42.6  29.8*  28.0*   WBC  9.4  11.3*  28.3*   PLT  292  347  353     COAGULATION No results found for this basename: INR,  in the last 168 hours  CARDIAC   Recent Labs Lab  08/01/13 0930  08/01/13 1424   TROPONINI  <0.30  <0.30     Recent Labs Lab  08/01/13 1424   PROBNP  42.3      CHEMISTRY  Recent Labs Lab  08/01/13 0930  08/01/13 1424   NA  132*  134*   K  4.2  4.8   CL  100  104   CO2  18*  20   GLUCOSE  368*  197*   BUN  9  10   CREATININE  0.85  0.68   CALCIUM  8.1*  8.0*   MG   --   1.7   PHOS   --   2.6    Estimated Creatinine Clearance: 120.2 ml/min (by C-G formula based on Cr of 0.68).   LIVER  Recent Labs Lab   08/01/13 0930  08/01/13 1424   AST  216*  209*   ALT  260*  254*   ALKPHOS  61  43   BILITOT  0.1*  0.2*   PROT  4.3*  5.2*   ALBUMIN  2.3*  2.7*     INFECTIOUS  Recent Labs Lab  08/01/13 1425   LATICACIDVEN  4.0*    ENDOCRINE CBG (last 3)    Recent Labs   08/01/13 1033  08/01/13 1222  08/01/13 1622   GLUCAP  255*  174*  175*    CBC  Recent Labs Lab  08/01/13 0630  08/01/13 0930  08/01/13 1424   HGB  14.9  10.1*  9.9*   HCT  42.6  29.8*  28.0*   WBC  9.4  11.3*  28.3*   PLT  292  347  353     COAGULATION No results found for this basename: INR,  in the last 168 hours  CARDIAC   Recent Labs Lab  08/01/13 0930  08/01/13 1424   TROPONINI  <0.30  <0.30     Recent Labs Lab  08/01/13 1424   PROBNP  42.3     CHEMISTRY  Recent Labs Lab  08/01/13 0930  08/01/13 1424   NA  132*  134*   K  4.2  4.8   CL  100  104   CO2  18*  20   GLUCOSE  368*  197*   BUN  9  10   CREATININE  0.85  0.68   CALCIUM  8.1*  8.0*   MG   --   1.7   PHOS   --   2.6    Estimated  Creatinine Clearance: 120.2 ml/min (by C-G formula based on Cr of 0.68).   LIVER  Recent Labs Lab  08/01/13 0930  08/01/13 1424   AST  216*  209*   ALT  260*  254*   ALKPHOS  61  43   BILITOT  0.1*  0.2*   PROT  4.3*  5.2*   ALBUMIN  2.3*  2.7*      INFECTIOUS  Recent Labs Lab  08/01/13 1425   LATICACIDVEN  4.0*      ENDOCRINE CBG (last 3)    Recent Labs   08/01/13 1033  08/01/13 1222  08/01/13 1622   GLUCAP  255*  174*  175*    HOSPITAL COURSE:  # PEA arrest -  Etiology was thought to be due to vagal response from abdominal insufflation. Resolved quickly with CPR. Phenylephrine started briefly.  Pt extubated soon after arrest on 8/11.  Patient was doing well after being off all pressors and extubation. We planned to observe patient on tele bed to the next morning before discharge. By then, she would be off of vasopressors for 48 hrs. Unfortunately, she left  hospital on AMA.   # Acute Blood Loss Anemia - The patient's Hb has dropped from 14.9 on 8/11, to a minimum of 7.8 on 8/12, back to 9.4 on 8/13 after 3 units pRBC's on 8/12.  CT abdomen showed hemoperitoneum and large hematoma, likely accounting for bleed. I spoke to radiologist on call, who suggested to observe patient conservatively, since pt does not have any signs of infection. GYN was consulted, who also recommended to watch closely with H&H checking q6hrs. Her Hgb was 10.0 before she left hospital on AMA. She has an appointment with Ob/Gyn on 08/25/13.   # Urinary retention - pt notes no prior difficulty with urination prior to hospitalization.  Differential includes positional factors (per pt belief) vs mild foley trauma vs UTI.  Foley removed the morning of 8/13.   # Sinus Tachycardia - pt notes mild palpitations over the last few days, with tachycardia up to the 110's. Likely represents anemia/volume depletion. Symptoms were improving after having received 3 U pRBC's 8/12.  # Hyperglycemia - the patient has had mild hyperglycemia this admission, (total daily insulin usage = 16 units on 8/12).  She has no history of DM, with an A1C = 5.4 (08/01/13), but may have some insulin resistance in the setting of obesity/PCOS. She was treated with SSI.   # GERD - chronic, stable. Continued pepcid  # Tobacco abuse - pt quit 1 month ago, using nicotine patches. Continued nicotine patches  # Possible OSA - pt may benefit from outpatient sleep study  DISCHARGE DATA: Vital Signs: BP 133/73  Pulse 99  Temp(Src) 99.8 F (37.7 C) (Oral)  Resp 18  Ht 5\' 2"  (1.575 m)  Wt 268 lb 15.4 oz (122 kg)  BMI 49.18 kg/m2  SpO2 98%  LMP 07/06/2013  Labs: No results found for this or any previous visit (from the past 24 hour(s)).   Services Ordered on Discharge: Y = Yes; Blank = No PT:   OT:   RN:   Equipment:   Other:      Time Spent on Discharge: 35 min   Signed: Lorretta Harp, MD PGY 2, Internal  Medicine Resident 08/05/2013, 3:38 PM

## 2013-08-09 NOTE — Discharge Summary (Signed)
  Date: 08/09/2013  Patient name: Whitney Wagner  Medical record number: [id number]  Date of birth: 05-Jan-1976   This patient has been seen and the plan of care was discussed with the house staff. Please see their note for complete details. I concur with their findings with the following additions/corrections:  Despite discussing with the patient that it would be optimal to have her stay overnight to observe that her blood counts remain stable and no longer has further arrhythmia, she has decided to leave AMA.  Judyann Munson, MD 08/09/2013, 9:33 PM

## 2013-08-25 ENCOUNTER — Ambulatory Visit: Payer: Self-pay | Admitting: Obstetrics & Gynecology

## 2013-08-31 ENCOUNTER — Ambulatory Visit: Payer: Self-pay | Admitting: Obstetrics & Gynecology

## 2013-10-17 ENCOUNTER — Ambulatory Visit: Payer: Self-pay | Admitting: Obstetrics & Gynecology

## 2013-10-26 SURGERY — Surgical Case
Anesthesia: *Unknown

## 2013-11-07 ENCOUNTER — Encounter (HOSPITAL_COMMUNITY): Admission: RE | Payer: Self-pay | Source: Ambulatory Visit

## 2013-11-07 ENCOUNTER — Inpatient Hospital Stay (HOSPITAL_COMMUNITY)
Admission: RE | Admit: 2013-11-07 | Payer: BC Managed Care – PPO | Source: Ambulatory Visit | Admitting: Obstetrics & Gynecology

## 2013-11-07 SURGERY — HYSTERECTOMY, ABDOMINAL
Anesthesia: Choice | Site: Abdomen

## 2014-03-11 DIAGNOSIS — Z90710 Acquired absence of both cervix and uterus: Secondary | ICD-10-CM | POA: Insufficient documentation

## 2015-12-23 DIAGNOSIS — E119 Type 2 diabetes mellitus without complications: Secondary | ICD-10-CM

## 2015-12-23 HISTORY — DX: Type 2 diabetes mellitus without complications: E11.9

## 2016-04-25 ENCOUNTER — Encounter (HOSPITAL_COMMUNITY): Payer: Self-pay | Admitting: Emergency Medicine

## 2016-04-25 ENCOUNTER — Emergency Department (HOSPITAL_COMMUNITY)
Admission: EM | Admit: 2016-04-25 | Discharge: 2016-04-25 | Disposition: A | Discharge status: Home / Self Care | Payer: Self-pay | Attending: Emergency Medicine | Admitting: Emergency Medicine

## 2016-04-25 DIAGNOSIS — M25512 Pain in left shoulder: Secondary | ICD-10-CM | POA: Insufficient documentation

## 2016-04-25 DIAGNOSIS — K0889 Other specified disorders of teeth and supporting structures: Secondary | ICD-10-CM | POA: Insufficient documentation

## 2016-04-25 DIAGNOSIS — M19041 Primary osteoarthritis, right hand: Secondary | ICD-10-CM | POA: Insufficient documentation

## 2016-04-25 DIAGNOSIS — K047 Periapical abscess without sinus: Secondary | ICD-10-CM | POA: Insufficient documentation

## 2016-04-25 DIAGNOSIS — E119 Type 2 diabetes mellitus without complications: Secondary | ICD-10-CM | POA: Insufficient documentation

## 2016-04-25 DIAGNOSIS — Z79899 Other long term (current) drug therapy: Secondary | ICD-10-CM | POA: Insufficient documentation

## 2016-04-25 DIAGNOSIS — Z87891 Personal history of nicotine dependence: Secondary | ICD-10-CM | POA: Insufficient documentation

## 2016-04-25 DIAGNOSIS — H9202 Otalgia, left ear: Secondary | ICD-10-CM | POA: Insufficient documentation

## 2016-04-25 DIAGNOSIS — M542 Cervicalgia: Secondary | ICD-10-CM | POA: Insufficient documentation

## 2016-04-25 DIAGNOSIS — Z8679 Personal history of other diseases of the circulatory system: Secondary | ICD-10-CM | POA: Insufficient documentation

## 2016-04-25 DIAGNOSIS — Z8659 Personal history of other mental and behavioral disorders: Secondary | ICD-10-CM | POA: Insufficient documentation

## 2016-04-25 DIAGNOSIS — R509 Fever, unspecified: Secondary | ICD-10-CM | POA: Insufficient documentation

## 2016-04-25 DIAGNOSIS — M19042 Primary osteoarthritis, left hand: Secondary | ICD-10-CM | POA: Insufficient documentation

## 2016-04-25 DIAGNOSIS — Z86018 Personal history of other benign neoplasm: Secondary | ICD-10-CM | POA: Insufficient documentation

## 2016-04-25 DIAGNOSIS — K029 Dental caries, unspecified: Secondary | ICD-10-CM

## 2016-04-25 DIAGNOSIS — J3489 Other specified disorders of nose and nasal sinuses: Secondary | ICD-10-CM | POA: Insufficient documentation

## 2016-04-25 HISTORY — DX: Type 2 diabetes mellitus without complications: E11.9

## 2016-04-25 MED ORDER — PENICILLIN V POTASSIUM 500 MG PO TABS
500.0000 mg | ORAL_TABLET | Freq: Once | ORAL | Status: AC
  Administered 2016-04-25: 500 mg via ORAL
  Filled 2016-04-25: qty 1

## 2016-04-25 MED ORDER — HYDROCODONE-ACETAMINOPHEN 5-325 MG PO TABS: 1.0000 | ORAL_TABLET | Freq: Four times a day (QID) | ORAL | Status: DC | PRN

## 2016-04-25 MED ORDER — PENICILLIN V POTASSIUM 500 MG PO TABS: 500.0000 mg | ORAL_TABLET | Freq: Four times a day (QID) | ORAL | Status: AC

## 2016-04-25 MED ORDER — OXYCODONE-ACETAMINOPHEN 5-325 MG PO TABS
1.0000 | ORAL_TABLET | Freq: Once | ORAL | Status: AC
  Administered 2016-04-25: 1 via ORAL
  Filled 2016-04-25: qty 1

## 2016-04-25 NOTE — ED Notes (Signed)
Pt reports severe burning and stabbing pain from a tooth in l/upper mouth x 3 days. Tx with Lidocaine oral solution-decreased pain for 15 minutes. Taking Motrin and Tylenol. C/o pain and swelling in l/neck extending to shoulder.

## 2016-04-25 NOTE — Discharge Instructions (Signed)
Dental Caries °Dental caries (also called tooth decay) is the most common oral disease. It can occur at any age but is more common in children and young adults.  °HOW DENTAL CARIES DEVELOPS  °The process of decay begins when bacteria and foods (particularly sugars and starches) combine in your mouth to produce plaque. Plaque is a substance that sticks to the hard, outer surface of a tooth (enamel). The bacteria in plaque produce acids that attack enamel. These acids may also attack the root surface of a tooth (cementum) if it is exposed. Repeated attacks dissolve these surfaces and create holes in the tooth (cavities). If left untreated, the acids destroy the other layers of the tooth.  °RISK FACTORS °· Frequent sipping of sugary beverages.   °· Frequent snacking on sugary and starchy foods, especially those that easily get stuck in the teeth.   °· Poor oral hygiene.   °· Dry mouth.   °· Substance abuse such as methamphetamine abuse.   °· Broken or poor-fitting dental restorations.   °· Eating disorders.   °· Gastroesophageal reflux disease (GERD).   °· Certain radiation treatments to the head and neck. °SYMPTOMS °In the early stages of dental caries, symptoms are seldom present. Sometimes white, chalky areas may be seen on the enamel or other tooth layers. In later stages, symptoms may include: °· Pits and holes on the enamel. °· Toothache after sweet, hot, or cold foods or drinks are consumed. °· Pain around the tooth. °· Swelling around the tooth. °DIAGNOSIS  °Most of the time, dental caries is detected during a regular dental checkup. A diagnosis is made after a thorough medical and dental history is taken and the surfaces of your teeth are checked for signs of dental caries. Sometimes special instruments, such as lasers, are used to check for dental caries. Dental X-ray exams may be taken so that areas not visible to the eye (such as between the contact areas of the teeth) can be checked for cavities.    °TREATMENT  °If dental caries is in its early stages, it may be reversed with a fluoride treatment or an application of a remineralizing agent at the dental office. Thorough brushing and flossing at home is needed to aid these treatments. If it is in its later stages, treatment depends on the location and extent of tooth destruction:  °· If a small area of the tooth has been destroyed, the destroyed area will be removed and cavities will be filled with a material such as gold, silver amalgam, or composite resin.   °· If a large area of the tooth has been destroyed, the destroyed area will be removed and a cap (crown) will be fitted over the remaining tooth structure.   °· If the center part of the tooth (pulp) is affected, a procedure called a root canal will be needed before a filling or crown can be placed.   °· If most of the tooth has been destroyed, the tooth may need to be pulled (extracted). °HOME CARE INSTRUCTIONS °You can prevent, stop, or reverse dental caries at home by practicing good oral hygiene. Good oral hygiene includes: °· Thoroughly cleaning your teeth at least twice a day with a toothbrush and dental floss.   °· Using a fluoride toothpaste. A fluoride mouth rinse may also be used if recommended by your dentist or health care provider.   °· Restricting the amount of sugary and starchy foods and sugary liquids you consume.   °· Avoiding frequent snacking on these foods and sipping of these liquids.   °· Keeping regular visits with   a dentist for checkups and cleanings. PREVENTION   Practice good oral hygiene.  Consider a dental sealant. A dental sealant is a coating material that is applied by your dentist to the pits and grooves of teeth. The sealant prevents food from being trapped in them. It may protect the teeth for several years.  Ask about fluoride supplements if you live in a community without fluorinated water or with water that has a low fluoride content. Use fluoride supplements  as directed by your dentist or health care provider.  Allow fluoride varnish applications to teeth if directed by your dentist or health care provider.   This information is not intended to replace advice given to you by your health care provider. Make sure you discuss any questions you have with your health care provider.  [employer] [facility] [employer] is a great source of information about community services available.  Access by dialing 2-1-1 from anywhere in [place], or by website -  Cust[employer]ianSupp[employer]fi.   Other Local Resources (Updated 12/2015)  Dental  Care   Services    Phone Number and Address  Cost  [facility] For children 25 - 28 years of age:   Cleaning  Tooth brushing/flossing instruction  Sealants, fillings, crowns  Extractions  Emergency treatment  [phone] [address], [place] [address] Charges based on family income.  Medicaid and some insurance plans accepted.     [facility] - [facility], fillings, crowns  Extractions  Emergency treatment [phone] [address], [place]  Pregnant women 79 years of age or older with a Medicaid card  [facility] - [place]  Cleaning  Sealants, fillings, crowns  Extractions  Emergency treatment [phone] [address] [place], [place] Pregnant women 13 years of age or older with a Medicaid card  [facility] For children 50 - 84 years of age:   Cleaning  Tooth brushing/flossing instruction  Sealants, fillings, crowns  Extractions  Emergency treatment Limited orthodontic services for patients with Medicaid [phone] [address], [place] [address] Medicaid and [facility] Health Choice cover for children up to age 28 and pregnant women.  Parents of children up to age 11  without Medicaid pay a reduced fee at time of service.  [place] For children 1 - 32 years of age:   Cleaning  Tooth brushing/flossing instruction  Sealants, fillings, crowns  Extractions  Emergency treatment Limited orthodontic services for patients with Medicaid [phone] [place], [place].  Medicaid and [place] Health Choice cover for children up to age 49 and pregnant women.  Parents of children up to age 67 without Medicaid pay a reduced fee.  [facility]  Sealants, fillings, crowns  Extractions  Hours: Tuesdays and Thursdays, 4:15 - 8 pm [phone] [address] [place], [place] [address] Services free of charge to [facility] residents ages 18-64 who do not have health insurance, Medicare, [place], or [place] benefits and fall within federal poverty guidelines  [place] care in addition to primary medical care, nutritional counseling, and pharmacy:  Cleaning  Sealants, fillings, crowns  Extractions                  [phone]  [facility], [place], [place], [place], [place], [place] [facility], [place], [facility] [place], [place], [place], most insurance.  Also provides services available to all with fees adjusted based on ability to pay.    [facility]  Cleaning  Tooth brushing/flossing instruction  Sealants, fillings, crowns  Extractions  Emergency treatment Hours: Tuesdays, Thursdays, and Fridays from 8 am to 5 pm by appointment only. [phone] [address] [place], [place] [address] [facility] residents with Medicaid (depending on eligibility) and children with [facility] Health Choice - call for more information.  [facility]  Extractions only  Hours: 2nd and 4th Thursday of each month from 6:30 am - 9 am.   [phone] ext. [address] [place], [place] [address] Ages 53 and older only.  Patients are seen on a first come, first served basis.  [employer]  [employer]  Extractions  Orthodontics  Endodontics  Implants/Crowns/Bridges  Complete and partial dentures [phone] [place],  Patients must complete an application for services.  There is often a waiting list.      Follow-up with dentist as soon as possible for reevaluation. Take antibiotics as prescribed. Return to the ED if you experience severe worsening of her symptoms, facial swelling, difficulty swallowing, difficulty breathing, fevers, chills.

## 2016-04-25 NOTE — ED Provider Notes (Signed)
CSN: [id number]     Arrival date & time 04/25/16  1350 History  By signing my name below, I, Whitney Wagner, attest that this documentation has been prepared under the direction and in the presence of non-physician practitioner, Donnald Garre PA-C. Electronically Signed: Rowan Wagner, Scribe. 04/25/2016. 2:30 PM.   Chief Complaint  Patient presents with  . Dental Pain    l/upper jaw  . Facial Swelling    l/side facial swelling   The history is provided by the patient. No language interpreter was used.   HPI Comments:  Whitney Wagner is a 40 y.o. female with PMHx of DM who presents to the Emergency Department complaining of "severe", burning and stabbing pain in a left upper tooth onset 3 days ago, radiating to the roof of her mouth and other teeth. She states there is a hole in the tooth causing the pain. Pt reports associated swelling and pain in her left sinus, left neck, left shoulder and left ear. She notes subjective fever and chills at home. Pt took 1 Vicodin which provided temporary relief; she has taken Ibuprofen and Tylenol without relief. She recently started taking Metformin for DM. Pt has an appointment with [facility] Monday. Denies any medication allergies.  Past Medical History  Diagnosis Date  . PCOS (polycystic ovarian syndrome)   . Osgood-Schlatter's disease of left knee     left knee  . Uterine fibroid   . Arthritis     "beginnings in my hands" (05/26/2013)  . Tooth pain 05/26/2013    "bottom right" (05/26/2013)  . Seasonal allergies   . GERD (gastroesophageal reflux disease)     otc meds prn  . Migraines, neuralgic     '~ 3/week" (05/26/2013) - otc meds prn  . Morbid obesity ([place])   . Diabetes mellitus without complication [facility])    Past Surgical History  Procedure Laterality Date  . External fixation arm Right 1980's  . S/p multiple extractions with alveoloplasty  06/01/2013    Extraction of tooth #'s 5,12,30 with alveoloplasty-Dr. Enrique Sack  .  Wisdom tooth extraction    . Robotic assisted total hysterectomy N/A 08/01/2013    Procedure: ROBOTIC ASSISTED TOTAL HYSTERECTOMY;  Surgeon: Lavonia Drafts, MD;  Location: [place] [facility];  Service: Gynecology;  Laterality: N/A;  . Abdominal hysterectomy     Family History  Problem Relation Age of Onset  . Diabetes Brother   . Hyperlipidemia Brother   . Fibromyalgia Mother   . Cancer Mother   . Hypertension Mother   . Diabetes Mother   . Cancer Maternal Aunt     breast cancer   Social History  Substance Use Topics  . Smoking status: Former Smoker -- 1.00 packs/day for 21 years    Quit date: 07/08/2013  . Smokeless tobacco: Former Systems developer    Quit date: 07/01/2013  . Alcohol Use: No   OB History    Gravida Para Term Preterm AB TAB SAB Ectopic Multiple Living   0 0 0 0 0 0 0 0 0 0      Review of Systems  Constitutional: Positive for fever and chills.  HENT: Positive for dental problem, ear pain and sinus pressure.   Musculoskeletal: Positive for arthralgias and neck pain.  All other systems reviewed and are negative.  Allergies  Codeine  Home Medications   Prior to Admission medications   Medication Sig Start Date End Date Taking? Authorizing Provider  nicotine (NICODERM CQ - DOSED IN MG/24 HOURS) 21 mg/24hr patch Place 1  patch onto the skin daily.    Historical Provider, MD  traMADol (ULTRAM) 50 MG tablet Take 1 tablet (50 mg total) by mouth every 6 (six) hours as needed for pain. 08/04/13   Lavonia Drafts, MD   BP 156/96 mmHg  Pulse 88  Temp(Src) 98.8 F (37.1 C) (Oral)  Resp 20  Wt 270 lb (122.471 kg)  SpO2 100%  LMP 07/06/2013 Physical Exam  Constitutional: She is oriented to person, place, and time. She appears well-developed and well-nourished. No distress.  HENT:  Head: Normocephalic and atraumatic.  Very poor dentition, severe dental caries; gingival swelling around left upper incisor without obvious dental abscess; TTP over left maxillar sinus; TMs  clear BL; no swelling under tongue or of face  Eyes: Conjunctivae are normal. Right eye exhibits no discharge. Left eye exhibits no discharge. No scleral icterus.  Cardiovascular: Normal rate.   Pulmonary/Chest: Effort normal.  Neurological: She is alert and oriented to person, place, and time. Coordination normal.  Skin: Skin is warm and dry. No rash noted. She is not diaphoretic. No erythema. No pallor.  Psychiatric: She has a normal mood and affect. Her behavior is normal.  Nursing note and vitals reviewed.   ED Course  Procedures  DIAGNOSTIC STUDIES:  Oxygen Saturation is 100% on RA, normal by my interpretation.    COORDINATION OF CARE:  2:27 PM Will administer antibiotic and pain medication prior to discharge. Discussed treatment plan with pt at bedside and pt agreed to plan.  Labs Review Labs Reviewed - No data to display  Imaging Review No results found. I have personally reviewed and evaluated these images and lab results as part of my medical decision-making.   EKG Interpretation None      MDM   Final diagnoses:  Pain due to dental caries   Patient with dentalgia.  No abscess requiring immediate incision and drainage.  Exam not concerning for Ludwig's angina or pharyngeal abscess.  Will treat with PCN and pain medication. Pt instructed to follow-up with dentist.  Discussed return precautions. Pt safe for discharge.    I personally performed the services described in this documentation, which was scribed in my presence. The recorded information has been reviewed and is accurate.     Whitney Spry Ozora, PA-C 04/25/16 [place], MD 05/03/16 [phone]

## 2016-04-28 ENCOUNTER — Ambulatory Visit (INDEPENDENT_AMBULATORY_CARE_PROVIDER_SITE_OTHER): Payer: Self-pay | Admitting: Internal Medicine

## 2016-04-28 ENCOUNTER — Encounter: Payer: Self-pay | Admitting: Internal Medicine

## 2016-04-28 VITALS — BP 143/95 | HR 97 | Temp 98.0°F | Ht 63.0 in | Wt 277.0 lb

## 2016-04-28 DIAGNOSIS — E1169 Type 2 diabetes mellitus with other specified complication: Secondary | ICD-10-CM

## 2016-04-28 DIAGNOSIS — K029 Dental caries, unspecified: Secondary | ICD-10-CM

## 2016-04-28 DIAGNOSIS — E1165 Type 2 diabetes mellitus with hyperglycemia: Secondary | ICD-10-CM

## 2016-04-28 DIAGNOSIS — Z794 Long term (current) use of insulin: Secondary | ICD-10-CM

## 2016-04-28 DIAGNOSIS — K0889 Other specified disorders of teeth and supporting structures: Secondary | ICD-10-CM

## 2016-04-28 DIAGNOSIS — E119 Type 2 diabetes mellitus without complications: Secondary | ICD-10-CM | POA: Insufficient documentation

## 2016-04-28 LAB — POCT GLYCOSYLATED HEMOGLOBIN (HGB A1C): Hemoglobin A1C: 9

## 2016-04-28 LAB — GLUCOSE, CAPILLARY: Glucose-Capillary: 224 mg/dL — ABNORMAL HIGH (ref 65–99)

## 2016-04-28 MED ORDER — METFORMIN HCL 1000 MG PO TABS: 1000.0000 mg | ORAL_TABLET | Freq: Two times a day (BID) | ORAL | Status: DC

## 2016-04-28 MED ORDER — HYDROCODONE-ACETAMINOPHEN 5-325 MG PO TABS: 1.0000 | ORAL_TABLET | Freq: Four times a day (QID) | ORAL | Status: DC | PRN

## 2016-04-28 NOTE — Patient Instructions (Signed)
Whitney Wagner,  It was a pleasure meeting you today.  For your dental pain, we will hold the course with the current antibiotics. If you feel it's getting worse, or your are nearing the end of your antibiotics with no sign of resolution, please call the clinic and we can try another antibiotic. We are going to set you up with the orange card so that you may receive dental services.  For your new diabetes, we are going to double your metformin dose and refer you to a diabetes educator.  The longterm effects of e-cigarettes are unknown, and I would encourage you stop doing them.  Please follow up with [place] in a month to see how your diabetes is doing.  Dental Caries Dental caries (also called tooth decay) is the most common oral disease. It can occur at any age but is more common in children and young adults.  HOW DENTAL CARIES DEVELOPS  The process of decay begins when bacteria and foods (particularly sugars and starches) combine in your mouth to produce plaque. Plaque is a substance that sticks to the hard, outer surface of a tooth (enamel). The bacteria in plaque produce acids that attack enamel. These acids may also attack the root surface of a tooth (cementum) if it is exposed. Repeated attacks dissolve these surfaces and create holes in the tooth (cavities). If left untreated, the acids destroy the other layers of the tooth.  RISK FACTORS  Frequent sipping of sugary beverages.   Frequent snacking on sugary and starchy foods, especially those that easily get stuck in the teeth.   Poor oral hygiene.   Dry mouth.   Substance abuse such as methamphetamine abuse.   Broken or poor-fitting dental restorations.   Eating disorders.   Gastroesophageal reflux disease (GERD).   Certain radiation treatments to the head and neck. SYMPTOMS In the early stages of dental caries, symptoms are seldom present. Sometimes white, chalky areas may be seen on the enamel or other tooth layers. In  later stages, symptoms may include:  Pits and holes on the enamel.  Toothache after sweet, hot, or cold foods or drinks are consumed.  Pain around the tooth.  Swelling around the tooth. DIAGNOSIS  Most of the time, dental caries is detected during a regular dental checkup. A diagnosis is made after a thorough medical and dental history is taken and the surfaces of your teeth are checked for signs of dental caries. Sometimes special instruments, such as lasers, are used to check for dental caries. Dental X-ray exams may be taken so that areas not visible to the eye (such as between the contact areas of the teeth) can be checked for cavities.  TREATMENT  If dental caries is in its early stages, it may be reversed with a fluoride treatment or an application of a remineralizing agent at the dental office. Thorough brushing and flossing at home is needed to aid these treatments. If it is in its later stages, treatment depends on the location and extent of tooth destruction:   If a small area of the tooth has been destroyed, the destroyed area will be removed and cavities will be filled with a material such as gold, silver amalgam, or composite resin.   If a large area of the tooth has been destroyed, the destroyed area will be removed and a cap (crown) will be fitted over the remaining tooth structure.   If the center part of the tooth (pulp) is affected, a procedure called a root canal  will be needed before a filling or crown can be placed.   If most of the tooth has been destroyed, the tooth may need to be pulled (extracted). HOME CARE INSTRUCTIONS You can prevent, stop, or reverse dental caries at home by practicing good oral hygiene. Good oral hygiene includes:  Thoroughly cleaning your teeth at least twice a day with a toothbrush and dental floss.   Using a fluoride toothpaste. A fluoride mouth rinse may also be used if recommended by your dentist or health care provider.    Restricting the amount of sugary and starchy foods and sugary liquids you consume.   Avoiding frequent snacking on these foods and sipping of these liquids.   Keeping regular visits with a dentist for checkups and cleanings. PREVENTION   Practice good oral hygiene.  Consider a dental sealant. A dental sealant is a coating material that is applied by your dentist to the pits and grooves of teeth. The sealant prevents food from being trapped in them. It may protect the teeth for several years.  Ask about fluoride supplements if you live in a community without fluorinated water or with water that has a low fluoride content. Use fluoride supplements as directed by your dentist or health care provider.  Allow fluoride varnish applications to teeth if directed by your dentist or health care provider.   This information is not intended to replace advice given to you by your health care provider. Make sure you discuss any questions you have with your health care provider.   Document Released: 08/30/2002 Document Revised: 12/29/2014 Document Reviewed: 12/10/2012 Elsevier Interactive Patient Education [employer].

## 2016-04-28 NOTE — Progress Notes (Signed)
   Subjective:    Patient ID: Whitney Wagner, female    DOB: December 24, 1975, 40 y.o.   MRN: [id number]  HPI  Ms. Galecki is a 40 year old woman with a PMH of PCOS s/p ovary sparing hysterectomy, and obesity who comes to the clinic to discuss ongoing dental pain and multiple recorded high blood glucoses in ED. On 5/5, the patient felt sudden onset pain starting from her upper left incisor radiating to her sinuses. It was associated with left sided facial swelling and redness. She also felt some chills at that time. She went to the ED and was prescribed a 10-day course of penicillin with 4 tabs of hydrocodone-acetaminophen 5-325. Since then, her pain has improved somewhat and the redness and swelling have subsided. However, the pain continues to be burdensome. She denies any fever or chills today. She has some minimal loose stools with the PCN, but only once or twice a day. She has had recorded episodes of hyperglycemia in the 200-300 range. She denies being on oral steroids. She endorses 6 months of polyuria, polidipsia and polyphagia. Both her mother and father had diabetes. She describes some occasional burning in her feet, but it is not constant. She smokes e-cigarettes only and works in [employer]. 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   Review of Systems  Constitutional: Negative for fever and chills.  HENT: Positive for dental problem and sinus pressure. Negative for voice change.   Eyes: Negative for pain and visual disturbance.  Respiratory: Negative for cough and shortness of breath.   Cardiovascular: Negative for chest pain and palpitations.  Gastrointestinal: Negative for abdominal pain.       Loose stool.  Endocrine: Positive for polydipsia, polyphagia and polyuria.  Genitourinary: Negative for dysuria and urgency.  Neurological: Positive for headaches. Negative for numbness.       Objective:   Physical Exam  Constitutional: No distress.  Obese.  HENT:  Minimal swelling around upper left incisors. No facial swelling. TTP of left maxillary sinus. Poor dentition.  Eyes: Conjunctivae are normal. No scleral icterus.  Neck: Neck supple.  Cardiovascular: Normal rate, regular rhythm and normal heart sounds.   Pulmonary/Chest: Effort normal and breath sounds normal. No respiratory distress. She has no wheezes.  Abdominal: Soft. Bowel sounds are normal. She exhibits no distension. There is no tenderness.  Lymphadenopathy:    She has no cervical adenopathy.  Neurological: She is alert.  Sensation to light touch in tact in both lower extremities.  Skin: Skin is warm and dry. She is not diaphoretic.  Psychiatric: She has a normal mood and affect. Her behavior is normal.  Vitals reviewed.         Assessment & Plan:   Please see problem based assessment and plan for details.

## 2016-04-28 NOTE — Assessment & Plan Note (Addendum)
A: New onset diabetes with symptomatic hyperglycemia (polyuria, polydipsia, polyphagia). A1c is 9 today, but reviewing Care Everywhere, was <6 in 2015. H/o of PCOS likely a predisposing factor. Her BGs remain in 200s with a high A1c, so we will double her metformin dose, which she tolerates well. She may have a degree of neuropathy, but the patient would like to avoid any possibly sedating medications given her workload. She would prefer to treat her diabetes an wait for it to improve.   P: Increase Metformin to 500 mg BID to 1000 mg BID Referral to Diabetes educator

## 2016-04-28 NOTE — Progress Notes (Signed)
Internal Medicine Clinic Attending  Case discussed with Dr. Ford at the time of the visit.  We reviewed the resident's history and exam and pertinent patient test results.  I agree with the assessment, diagnosis, and plan of care documented in the resident's note.  

## 2016-04-28 NOTE — Assessment & Plan Note (Addendum)
A: No signs of systemic infection or abscess formation. She is on Day 4 of her 10-day PCN course and it is already improving. I instructed her to call the clinic if she feels it is worsening or if she is nearing her PCN course with only minimal improvement. She is having some loose stools that are no more than twice a day, not severe enough to warrant C diff testing at this time. She needs an orange card (financial assistance) so that she may obtain a dental referral.   P: - Complete Penicillin 500 mg QID, last dose 5/14 - Hydrocodone-APAP 5-325 q6h prn (12 tablets) - Dental referral pending orange card

## 2016-04-30 ENCOUNTER — Telehealth: Payer: Self-pay | Admitting: Internal Medicine

## 2016-04-30 DIAGNOSIS — B373 Candidiasis of vulva and vagina: Secondary | ICD-10-CM

## 2016-04-30 DIAGNOSIS — B3731 Acute candidiasis of vulva and vagina: Secondary | ICD-10-CM

## 2016-04-30 NOTE — Telephone Encounter (Signed)
Pt requesting diflucan?  Maybe due to antibiotics for her dental issue- should she come in?

## 2016-04-30 NOTE — Telephone Encounter (Signed)
Refill on Diflucan

## 2016-05-01 DIAGNOSIS — B3731 Acute candidiasis of vulva and vagina: Secondary | ICD-10-CM | POA: Insufficient documentation

## 2016-05-01 DIAGNOSIS — B373 Candidiasis of vulva and vagina: Secondary | ICD-10-CM | POA: Insufficient documentation

## 2016-05-01 MED ORDER — FLUCONAZOLE 150 MG PO TABS: 150.0000 mg | ORAL_TABLET | Freq: Every day | ORAL | Status: DC

## 2016-05-01 NOTE — Telephone Encounter (Signed)
I have called this patient and she is having mild symptoms of vaginal candidiasis. She says two days of diflucan usually "knocks it out." She reports that penicillin has been effective for her dental caries and she no longer requires hydrocodone. I told her to call the clinic on 5/15 after the completion of her antibiotics of her symptoms are not near resolution.

## 2016-05-21 ENCOUNTER — Telehealth: Payer: Self-pay | Admitting: Internal Medicine

## 2016-05-21 ENCOUNTER — Other Ambulatory Visit: Payer: Self-pay | Admitting: Internal Medicine

## 2016-05-21 ENCOUNTER — Telehealth: Payer: Self-pay | Admitting: *Deleted

## 2016-05-21 DIAGNOSIS — K029 Dental caries, unspecified: Secondary | ICD-10-CM

## 2016-05-21 MED ORDER — CLINDAMYCIN HCL 300 MG PO CAPS
600.0000 mg | ORAL_CAPSULE | Freq: Three times a day (TID) | ORAL | Status: DC
Start: 2016-05-21 — End: 2016-05-21

## 2016-05-21 MED ORDER — CLINDAMYCIN HCL 150 MG PO CAPS: 450.0000 mg | ORAL_CAPSULE | Freq: Three times a day (TID) | ORAL | Status: AC

## 2016-05-21 NOTE — Telephone Encounter (Signed)
I called the patient to inform her that we will attempt a 7 day course of clindamycin 450 mg three times daily for 7 days.

## 2016-05-21 NOTE — Telephone Encounter (Signed)
I called the patient back to inform her that if she were develop severe diarrhea after starting clindamycin, she should call the clinic.

## 2016-05-21 NOTE — Telephone Encounter (Signed)
Call from pt - states dental infection is back; c/o face swollen/throbbing "same as last time". States she was on penicillin the last time; requesting different antibiotic. Pharmacy is [facility] Thanks

## 2016-05-23 ENCOUNTER — Telehealth: Payer: Self-pay | Admitting: Internal Medicine

## 2016-05-23 NOTE — Telephone Encounter (Signed)
APT. REMINDER CALL, LMTCB °

## 2016-05-26 ENCOUNTER — Ambulatory Visit (INDEPENDENT_AMBULATORY_CARE_PROVIDER_SITE_OTHER): Payer: Self-pay | Admitting: Internal Medicine

## 2016-05-26 ENCOUNTER — Telehealth: Payer: Self-pay | Admitting: *Deleted

## 2016-05-26 ENCOUNTER — Encounter: Payer: Self-pay | Admitting: Internal Medicine

## 2016-05-26 ENCOUNTER — Encounter: Payer: Self-pay | Admitting: Dietician

## 2016-05-26 VITALS — BP 131/94 | HR 88 | Temp 97.5°F | Resp 18 | Ht 62.0 in | Wt 274.2 lb

## 2016-05-26 DIAGNOSIS — K029 Dental caries, unspecified: Secondary | ICD-10-CM

## 2016-05-26 DIAGNOSIS — K0889 Other specified disorders of teeth and supporting structures: Secondary | ICD-10-CM

## 2016-05-26 DIAGNOSIS — E1169 Type 2 diabetes mellitus with other specified complication: Secondary | ICD-10-CM

## 2016-05-26 DIAGNOSIS — E1140 Type 2 diabetes mellitus with diabetic neuropathy, unspecified: Secondary | ICD-10-CM

## 2016-05-26 DIAGNOSIS — Z7984 Long term (current) use of oral hypoglycemic drugs: Secondary | ICD-10-CM

## 2016-05-26 LAB — GLUCOSE, CAPILLARY: Glucose-Capillary: 172 mg/dL — ABNORMAL HIGH (ref 65–99)

## 2016-05-26 MED ORDER — GABAPENTIN 100 MG PO CAPS: 100.0000 mg | ORAL_CAPSULE | Freq: Every day | ORAL | Status: DC

## 2016-05-26 NOTE — Assessment & Plan Note (Addendum)
Since increasing the metformin and working hard on lifestyle modifications such as exercising and cutting carbohydrates as well as portion control, her symptomatic hyperglycemia has resolved. She has been diligent about checking her blood sugars at home, and since increasing to full dose metformin, her readings have been consistently in the mid 100s. Her highest reading is right at 200, with no significant lows. She is not having any GI side effects from the metformin, and although she says she has IBS, her symptoms of chronic loose stools for several years are unchanged since titrating the medication. Today, she is concerned about her neuropathy, which is giving her significant discomfort particularly she is on her feet at work. She describes a burning ache in the bottoms of her feet as well as intermittent sharp shooting pains in her legs bilaterally. She has done some research online about gabapentin and she is concerned it is sedating so she would like to try Lyrica. I did explain to her that both medicines act very similarly and have a shared side effect of possible fatigue and grogginess. I also explained to her that Lyrica is non-generic and is very expensive, although assistance programs through [employer] are offered. She is agreeable to trying the lowest dose of gabapentin possible to see if this helps with her symptoms. -Start gabapentin 100 mg daily at bedtime for diabetic neuropathy -Instructed patient that if she has significant grogginess the next day, to let us know. Otherwise, can increase QHS dosing until symptomatic relief is achieved as long as she does not have significant sedation -Continue metformin 1000 twice a day  -Appointment with Butch Penny today -RTC in 2 months for A1c recheck

## 2016-05-26 NOTE — Telephone Encounter (Signed)
Telephone encounter opened in error when checking referral follow-up. No phone call made at this time. Yvonna Alanis, RN, 05/26/16, 6:28PM

## 2016-05-26 NOTE — Progress Notes (Signed)
   Patient ID: Whitney Wagner female   DOB: 09-14-1976 40 y.o.   MRN: [id number]  Subjective:   HPI: [name] is a 40 y.o. with PMH of T2DM, morbid obesity, and PCOS s/p ovary sparing hysterectomy who presents to [facility] today for follow-up of her diabetes and recent dental infection.   Please see problem-based charting for status of medical issues pertinent to this visit.  Review of Systems: Pertinent items noted in HPI and remainder of comprehensive ROS otherwise negative.  Objective:  Physical Exam: Filed Vitals:   05/26/16 0830  BP: 131/94  Pulse: 88  Temp: 97.5 F (36.4 C)  TempSrc: Oral  Resp: 18  Height: 5\' 2"  (1.575 m)  Weight: 274 lb 3.2 oz (124.376 kg)  SpO2: 98%  Gen: Well-appearing, alert and oriented to person, place, and time HEENT: Oropharynx clear without erythema or exudate. Necrotic maxillary incisor and multiple left maxillary molars without palpable swelling or fluid collection or drainage noted. Gingiva look pink and intact without pallor or hyperema.  Neck: No cervical LAD, no thyromegaly or nodules, no JVD noted. CV: Normal rate, regular rhythm, no murmurs, rubs, or gallops Pulmonary: Normal effort, CTA bilaterally, no wheezing, rales, or rhonchi Abdominal: Soft, non-tender, non-distended, without rebound, guarding, or masses Extremities: Distal pulses 2+ in upper and lower extremities bilaterally, no tenderness, erythema or edema Neuro: Sensation to fine touch intact in the feet bilaterally. 2+ patellar and Achilles reflexes. Skin: No atypical appearing moles. No rashes  Assessment & Plan:  Please see problem-based charting for assessment and plan.  Blane Ohara, MD Resident Physician, PGY-1 Department of Internal Medicine [facility]

## 2016-05-26 NOTE — Assessment & Plan Note (Signed)
While she was initially improving on PCN, the infection did come back quickly. She still has no signs of systemic infection or abscess formation. She was recently started on a second course of antibiotics, this time clindamycin for 7 day course. She says that her symptoms have resolved quickly and had no recurrence of symptoms thus far. She denies any pain and has not needed any hydrocodone since starting the clindamycin. She still has another 2 days of clindamycin to take. She still has the orange card pending, and we will refer to dentistry thereafter. -Continue clindamycin course, last dose 05/28/2016 -Dental referral pending orange card

## 2016-05-26 NOTE — Patient Instructions (Signed)
Whitney Wagner,  I have prescribed gabapentin 100mg  to take one pill nightly between 1-2 hours before going to bed. If you feel too "hungover" or drowsy the next day, then we can stop the medication, but it is at the very lowest dose so I expect that this will not happen. We can increase the dose if needed if you tolerate it without the drowsy side effects. The most important thing to do is complete the paperwork with Marlana Latus so we can get you a dental referral and can consider Lyrica in the future if gabapentin isn't working well for you.  However, one of the best ways the improve the nerve pain is by getting your sugars under better control. You're making good progress!  I'm happy you're feeling better, Whitney Wagner

## 2016-05-26 NOTE — Progress Notes (Signed)
Internal Medicine Clinic Attending  Case discussed with Dr. Kennedy at the time of the visit.  We reviewed the resident's history and exam and pertinent patient test results.  I agree with the assessment, diagnosis, and plan of care documented in the resident's note.  

## 2016-05-27 ENCOUNTER — Encounter: Payer: Self-pay | Admitting: Dietician

## 2016-06-23 NOTE — Addendum Note (Signed)
Addended by: Hulan Fray on: 06/23/2016 03:53 PM   Modules accepted: Orders

## 2016-06-30 ENCOUNTER — Telehealth: Payer: Self-pay | Admitting: Internal Medicine

## 2016-06-30 DIAGNOSIS — E1169 Type 2 diabetes mellitus with other specified complication: Secondary | ICD-10-CM

## 2016-06-30 MED ORDER — PREGABALIN 50 MG PO CAPS: 50.0000 mg | ORAL_CAPSULE | Freq: Three times a day (TID) | ORAL | Status: DC

## 2016-06-30 NOTE — Telephone Encounter (Signed)
Rx is phone in please.  Thanks

## 2016-06-30 NOTE — Telephone Encounter (Signed)
Starting dose of lyrica would be 50 TID.  I can send in an Rx for this if she is willing to try a TID medication.  She could start with 50mg  at night and then increase to TID slowly to see if it causes her sedation.    Will place Rx and she can pick up if she feels this would work for her.    Please give her instructions - -  Lyrica 50mg  by mouth TID, start with evening doses and increase as tolerated to three times per day.  Please have her read all interaction instructions given by the pharmacy and call with any new symptoms.   Please have her schedule an appointment with new PCP in 1 month.  If no appointments, have her schedule [facility] for new medication follow up.

## 2016-06-30 NOTE — Telephone Encounter (Signed)
Called to pharmacy. Notified patient of instructions and to call for an appointment when she gets a letter of notification of PCP or in 2 weeks as the [facility] schedule does not go out that far. Patient agreed.

## 2016-06-30 NOTE — Telephone Encounter (Signed)
Patient has questions about changing her gabapentin medication to another med.

## 2016-06-30 NOTE — Telephone Encounter (Signed)
Patient calling to report gabapentin causing GI upset and constipation. Wants to know if she could switch to Lyrica. Discussed previously with MD and decided to try gabapentin because of cost. Pt. Has contacted the manufacturer of Lyrica and has a $25 dollar per month discount card. Please advise.

## 2016-08-08 ENCOUNTER — Encounter: Payer: Self-pay | Admitting: Internal Medicine

## 2016-08-08 ENCOUNTER — Ambulatory Visit (INDEPENDENT_AMBULATORY_CARE_PROVIDER_SITE_OTHER): Payer: Self-pay | Admitting: Internal Medicine

## 2016-08-08 VITALS — BP 114/60 | HR 102 | Temp 98.4°F | Ht 62.0 in | Wt 277.3 lb

## 2016-08-08 DIAGNOSIS — Z7984 Long term (current) use of oral hypoglycemic drugs: Secondary | ICD-10-CM

## 2016-08-08 DIAGNOSIS — K029 Dental caries, unspecified: Secondary | ICD-10-CM

## 2016-08-08 DIAGNOSIS — R220 Localized swelling, mass and lump, head: Secondary | ICD-10-CM

## 2016-08-08 DIAGNOSIS — Z23 Encounter for immunization: Secondary | ICD-10-CM

## 2016-08-08 DIAGNOSIS — F17290 Nicotine dependence, other tobacco product, uncomplicated: Secondary | ICD-10-CM

## 2016-08-08 DIAGNOSIS — E119 Type 2 diabetes mellitus without complications: Secondary | ICD-10-CM

## 2016-08-08 DIAGNOSIS — K0889 Other specified disorders of teeth and supporting structures: Secondary | ICD-10-CM

## 2016-08-08 DIAGNOSIS — E1169 Type 2 diabetes mellitus with other specified complication: Secondary | ICD-10-CM

## 2016-08-08 LAB — GLUCOSE, CAPILLARY: Glucose-Capillary: 179 mg/dL — ABNORMAL HIGH (ref 65–99)

## 2016-08-08 LAB — POCT GLYCOSYLATED HEMOGLOBIN (HGB A1C): Hemoglobin A1C: 7.7

## 2016-08-08 MED ORDER — PREGABALIN 50 MG PO CAPS: 50.0000 mg | ORAL_CAPSULE | Freq: Three times a day (TID) | ORAL | 2 refills | Status: DC

## 2016-08-08 MED ORDER — GABAPENTIN 100 MG PO CAPS: 100.0000 mg | ORAL_CAPSULE | Freq: Three times a day (TID) | ORAL | 2 refills | Status: DC

## 2016-08-08 MED ORDER — GLIPIZIDE 5 MG PO TABS: 5.0000 mg | ORAL_TABLET | Freq: Every day | ORAL | 11 refills | Status: DC

## 2016-08-08 MED ORDER — METFORMIN HCL 1000 MG PO TABS: 1000.0000 mg | ORAL_TABLET | Freq: Two times a day (BID) | ORAL | 5 refills | Status: DC

## 2016-08-08 NOTE — Patient Instructions (Addendum)
It was pleasure taking care of you today. We added one Glipizide 5 mg daily for a better control of your diabetes. You will be given pneumococcal vaccine today. Please come back in 3 months for follow up. We gave you Neurontin for one month so you can work on getting Lyrica.

## 2016-08-08 NOTE — Progress Notes (Signed)
   CC: For Follow up.   HPI:  [name] is a 40 y.o. came to clinic for her regular follow up. Still C/o burning pain in her feet.Gabapentin helps but make her sleepy. She wants to try Lyrica and brought some forms to be filled so she can get Lyrica free. She C/O occasional dental pain, and trying to get an appointment with Dentist.  Past Medical History:  Diagnosis Date  . Arthritis    "beginnings in my hands" (05/26/2013)  . Diabetes mellitus without complication ([place])   . GERD (gastroesophageal reflux disease)    otc meds prn  . Migraines, neuralgic    '~ 3/week" (05/26/2013) - otc meds prn  . Morbid obesity ([place])   . Osgood-Schlatter's disease of left knee    left knee  . PCOS (polycystic ovarian syndrome)   . Seasonal allergies   . Tooth pain 05/26/2013   "bottom right" (05/26/2013)  . Uterine fibroid     Review of Systems:  A complete ROS was negative except as per HPI.    Physical Exam:  Vitals:   08/08/16 1507  BP: 114/60  Pulse: (!) 102  Temp: 98.4 F (36.9 C)  TempSrc: Oral  SpO2: 100%  Weight: 277 lb 4.8 oz (125.8 kg)  Height: 5\' 2"  (1.575 m)   Vitals:   08/08/16 1507  BP: 114/60  Pulse: (!) 102  Temp: 98.4 F (36.9 C)  TempSrc: Oral  SpO2: 100%  Weight: 277 lb 4.8 oz (125.8 kg)  Height: 5\' 2"  (1.575 m)   General: Vital signs reviewed.  Patient is well-developed and well-nourished, in no acute distress and cooperative with exam.  Head: Normocephalic and atraumatic. Eyes: EOMI, conjunctivae normal, no scleral icterus.  Neck: Supple, trachea midline, normal ROM, no JVD, masses, thyromegaly, or carotid bruit present.  Cardiovascular: RRR, S1 normal, S2 normal, no murmurs, gallops, or rubs. Pulmonary/Chest: Clear to auscultation bilaterally, no wheezes, rales, or rhonchi. Abdominal: Soft, non-tender, non-distended, BS +, no masses, organomegaly, or guarding present.  Musculoskeletal: No joint deformities, erythema, or stiffness, ROM full and  nontender. Extremities: No lower extremity edema bilaterally,  pulses symmetric and intact bilaterally. No cyanosis or clubbing. Neurological: A&O x3, Strength is normal and symmetric bilaterally, cranial nerve II-XII are grossly intact, no focal motor deficit, sensory intact to light touch bilaterally.  Skin: Warm, dry and intact. No rashes or erythema. Psychiatric: Normal mood and affect. speech and behavior is normal. Cognition and memory are normal.   Assessment & Plan:  She was given pneumococcal vaccine today. See Encounters Tab for problem based charting.  Patient seen with Dr. Evette Doffing

## 2016-08-08 NOTE — Assessment & Plan Note (Signed)
She still have dental pain, with occasional swelling of her left cheek. Currently pain free during this visit Trying to get an appointment with Dentist.

## 2016-08-08 NOTE — Assessment & Plan Note (Signed)
Her A1c today was 7.7,below from 9.0 last time. She states she does watch her diet but slips occasionally.  Plan: Add Glipizide 5 mg daily. Continue Metformin 1000 mg BID

## 2016-08-08 NOTE — Assessment & Plan Note (Signed)
On e cigs. She states she is trying to completely quit.

## 2016-08-09 LAB — MICROALBUMIN / CREATININE URINE RATIO
Creatinine, Urine: 276.9 mg/dL
MICROALB/CREAT RATIO: 14.7 mg/g{creat} (ref 0.0–30.0)
Microalbumin, Urine: 40.8 ug/mL

## 2016-08-12 NOTE — Progress Notes (Signed)
Internal Medicine Clinic Attending  I saw and evaluated the patient.  I personally confirmed the key portions of the history and exam documented by Dr. Amin and I reviewed pertinent patient test results.  The assessment, diagnosis, and plan were formulated together and I agree with the documentation in the resident's note. 

## 2016-09-02 ENCOUNTER — Encounter: Payer: Self-pay | Admitting: Dietician

## 2016-09-10 ENCOUNTER — Telehealth: Payer: Self-pay | Admitting: Internal Medicine

## 2016-09-10 NOTE — Telephone Encounter (Signed)
asking for difulcan. Says she recently took antibiotics and now has yeast infection.

## 2016-09-10 NOTE — Telephone Encounter (Signed)
Request sent to PCP

## 2016-09-12 MED ORDER — FLUCONAZOLE 150 MG PO TABS: 150.0000 mg | ORAL_TABLET | Freq: Every day | ORAL | 0 refills | Status: DC

## 2016-09-12 NOTE — Telephone Encounter (Signed)
I called in for a prescription of Diflucan 150 mg 2 tablets. She should take 1 tablet today and the second 2 days later.

## 2016-09-15 NOTE — Telephone Encounter (Signed)
Called- stated she picked up Diflucan rx yesterday.

## 2016-09-19 ENCOUNTER — Telehealth: Payer: Self-pay | Admitting: Internal Medicine

## 2016-09-19 ENCOUNTER — Other Ambulatory Visit: Payer: Self-pay | Admitting: Internal Medicine

## 2016-09-19 ENCOUNTER — Telehealth: Payer: Self-pay | Admitting: *Deleted

## 2016-09-19 DIAGNOSIS — E1169 Type 2 diabetes mellitus with other specified complication: Secondary | ICD-10-CM

## 2016-09-19 MED ORDER — PREGABALIN 50 MG PO CAPS: 50.0000 mg | ORAL_CAPSULE | Freq: Three times a day (TID) | ORAL | 2 refills | Status: DC

## 2016-09-19 NOTE — Telephone Encounter (Signed)
APT. REMINDER CALL, LMTCB °

## 2016-09-19 NOTE — Telephone Encounter (Signed)
Lyrica rx faxed to [facility] per Dr Reesa Chew (fax # [phone]).

## 2016-09-22 ENCOUNTER — Encounter: Payer: Self-pay | Admitting: Dietician

## 2016-10-03 ENCOUNTER — Encounter: Payer: Self-pay | Admitting: Pharmacist

## 2016-10-03 NOTE — Progress Notes (Signed)
Letter received from [employer] confirming that patient has been approved for patient assistance program for pregabalin (Lyrica) through 08/27/17. Medication will be shipped to patient's home.  The letter states that refills for Lyrica should be called in to [phone].

## 2016-10-22 ENCOUNTER — Telehealth: Payer: Self-pay | Admitting: Internal Medicine

## 2016-10-22 NOTE — Telephone Encounter (Signed)
APT. REMINDER CALL, LMTCB °

## 2016-10-23 ENCOUNTER — Encounter: Payer: Self-pay | Admitting: Internal Medicine

## 2016-10-23 NOTE — Addendum Note (Signed)
Addended by: Hulan Fray on: 10/23/2016 06:41 PM   Modules accepted: Orders

## 2016-10-31 ENCOUNTER — Other Ambulatory Visit: Payer: Self-pay | Admitting: Internal Medicine

## 2016-10-31 DIAGNOSIS — E1169 Type 2 diabetes mellitus with other specified complication: Secondary | ICD-10-CM

## 2016-10-31 NOTE — Telephone Encounter (Signed)
Patient wanted to try Lyrica instead of Gabapentin and recently getting Lyrica through a patient assistant program.

## 2016-11-04 ENCOUNTER — Encounter: Payer: Self-pay | Admitting: Internal Medicine

## 2016-11-04 ENCOUNTER — Ambulatory Visit (INDEPENDENT_AMBULATORY_CARE_PROVIDER_SITE_OTHER): Payer: Self-pay | Admitting: Internal Medicine

## 2016-11-04 VITALS — BP 142/84 | HR 100 | Temp 98.2°F | Ht 63.0 in | Wt 278.3 lb

## 2016-11-04 DIAGNOSIS — K029 Dental caries, unspecified: Secondary | ICD-10-CM

## 2016-11-04 DIAGNOSIS — K0889 Other specified disorders of teeth and supporting structures: Secondary | ICD-10-CM

## 2016-11-04 DIAGNOSIS — E1169 Type 2 diabetes mellitus with other specified complication: Secondary | ICD-10-CM

## 2016-11-04 DIAGNOSIS — G629 Polyneuropathy, unspecified: Secondary | ICD-10-CM | POA: Insufficient documentation

## 2016-11-04 DIAGNOSIS — Z79899 Other long term (current) drug therapy: Secondary | ICD-10-CM

## 2016-11-04 DIAGNOSIS — Z7984 Long term (current) use of oral hypoglycemic drugs: Secondary | ICD-10-CM

## 2016-11-04 DIAGNOSIS — Z87891 Personal history of nicotine dependence: Secondary | ICD-10-CM

## 2016-11-04 DIAGNOSIS — E1142 Type 2 diabetes mellitus with diabetic polyneuropathy: Secondary | ICD-10-CM

## 2016-11-04 DIAGNOSIS — Z5181 Encounter for therapeutic drug level monitoring: Secondary | ICD-10-CM

## 2016-11-04 LAB — GLUCOSE, CAPILLARY: GLUCOSE-CAPILLARY: 149 mg/dL — AB (ref 65–99)

## 2016-11-04 LAB — POCT GLYCOSYLATED HEMOGLOBIN (HGB A1C): HEMOGLOBIN A1C: 6.3

## 2016-11-04 MED ORDER — TRAMADOL HCL 50 MG PO TABS: 50.0000 mg | ORAL_TABLET | Freq: Two times a day (BID) | ORAL | 0 refills | Status: DC | PRN

## 2016-11-04 MED ORDER — PREGABALIN 75 MG PO CAPS: 75.0000 mg | ORAL_CAPSULE | Freq: Three times a day (TID) | ORAL | 2 refills | Status: DC

## 2016-11-04 NOTE — Assessment & Plan Note (Signed)
Assessment A1c 6.3, improved from 7.7 at 3 months ago. Her medications include glipizide 5 mg daily before breakfast and metformin 1000 mg twice daily. She experiences some transient nausea after taking one of these medications though is unsure which one.   Plan -Continue glipizide 5 mg daily before breakfast and metformin 1000 mg twice daily -Follow-up with PCP in 3 months

## 2016-11-04 NOTE — Assessment & Plan Note (Addendum)
Assessment She has a long-standing history of dental disease for which she has been seeking dental care. [facility] with [facility] though they do not have any openings for the next 6 months. Her orange card paperwork is pending pay stubs which she collected yesterday from her employer, and she is planning to meet with our financial counselor.   For her pain, she has been taking acetaminophen extra strength 4 and ibuprofen 4 every 6 hours/day for the last month. Heating pad also alleviates some of her pain though she experiences it most when she lay supine as it radiates from her mouth to her head. She denies any nausea, vomiting, fevers.  Her mouth exam is notable for multiple fragmented and missing teeth. I did see one focal pocket of infection just superior to her left incisor. Overall, she is nontoxic in appearance and did not have any signs of jaundice.  Plan -Offered anabiotic therapy though she politely declined as she had a adverse yeast infection on her most recent round of therapy -Check CMET today to assess for any hepatic dysfunction or renal dysfunction. Recommended she stop acetaminophen until we can confirm she has no evidence of liver dysfunction to which I confirmed with teacher back. -Prescribe tramadol 50 mg 20 tablets to be taken every 12 hours as needed for pain given that her goals to function at work and not be sedated -Recommended she not exceed ibuprofen 2400 mg for daily dose -Recommended she get definitive management of a dentist through processing her orange card paperwork expediently  ADDENDUM 11/05/2016  8:54 AM:  LFTs reassuring. Creatinine 0.6, consistent with baseline 0.6-0.7. I will mail results to her.

## 2016-11-04 NOTE — Assessment & Plan Note (Addendum)
Assessment Since initiating therapy with pregabalin, she has noted an increase in her ability to work at her [employer]. Whereas she was spending 50% of her workday away from work prior to pregabalin, she is now spending 20% of her workday away from work as a consequence of her debilitating neuropathy she experiences bilaterally. She would like to try a higher dose to see if it improves her function.  Her current dose is the starting dose of 50 mg to be taken 3 times daily. With a goal of improved function, I do not think it's unreasonable to uptitrate her therapy.  Plan -Prescribed pregabalin 75 mg to be taken 3 times daily though she requests the prescription be faxed over. I will speak with our clinic pharmacist, Dr. Maudie Mercury, about the logistics of sending it through.  ADDENDUM 11/12/2016  11:16 AM:  Per Dr. Maudie Mercury, medication is to be faxed to this number [[phone]]. I will send it today.

## 2016-11-04 NOTE — Progress Notes (Signed)
   CC: Mouth pain  HPI:  [name] is a 40 y.o. female who presents today for mouth pain. Please see assessment & plan for status of chronic medical problems.   Past Medical History:  Diagnosis Date  . Arthritis    "beginnings in my hands" (05/26/2013)  . Diabetes mellitus without complication ([place])   . GERD (gastroesophageal reflux disease)    otc meds prn  . Migraines, neuralgic    '~ 3/week" (05/26/2013) - otc meds prn  . Morbid obesity ([place])   . Osgood-Schlatter's disease of left knee    left knee  . PCOS (polycystic ovarian syndrome)   . Seasonal allergies   . Tooth pain 05/26/2013   "bottom right" (05/26/2013)  . Uterine fibroid     Review of Systems:  Please see each problem below for a pertinent review of systems.   Physical Exam:  Vitals:   11/04/16 1538  BP: (!) 142/84  Pulse: 100  Temp: 98.2 F (36.8 C)  TempSrc: Oral  SpO2: 97%  Weight: 278 lb 4.8 oz (126.2 kg)  Height: 5\' 3"  (1.6 m)   Physical Exam  Constitutional: She is oriented to person, place, and time. No distress.  HENT:  Head: Normocephalic and atraumatic.  Multiple fragmented to missing teeth noted all throughout. Tender area of purulence noted just superior to the right upper incisor.  Eyes: Conjunctivae are normal. No scleral icterus.  Pulmonary/Chest: Effort normal. No respiratory distress.  Neurological: She is alert and oriented to person, place, and time.  Skin: She is not diaphoretic.     Assessment & Plan:   See Encounters Tab for problem based charting.  Patient discussed with Dr. Daryll Drown

## 2016-11-04 NOTE — Patient Instructions (Addendum)
STOP Tylenol.   TAKE no more than ibuprofen 2,400 mg.   TAKE tramadol 1 tablet twice daily as needed.   Please meet with Ms. Hill to get the [employer] processed.  We will call you back about your labwork if something is not right.   Please see Dr. Reesa Chew in 3 months for next check up.

## 2016-11-05 ENCOUNTER — Encounter: Payer: Self-pay | Admitting: Internal Medicine

## 2016-11-05 LAB — COMPREHENSIVE METABOLIC PANEL
A/G RATIO: 1.7 (ref 1.2–2.2)
ALBUMIN: 4.3 g/dL (ref 3.5–5.5)
ALK PHOS: 52 IU/L (ref 39–117)
ALT: 31 IU/L (ref 0–32)
AST: 23 IU/L (ref 0–40)
BUN / CREAT RATIO: 23 (ref 9–23)
BUN: 13 mg/dL (ref 6–24)
CHLORIDE: 96 mmol/L (ref 96–106)
CO2: 24 mmol/L (ref 18–29)
CREATININE: 0.57 mg/dL (ref 0.57–1.00)
Calcium: 9.7 mg/dL (ref 8.7–10.2)
GFR calc Af Amer: 134 mL/min/{1.73_m2} (ref >59)
GFR calc non Af Amer: 116 mL/min/{1.73_m2} (ref >59)
GLOBULIN, TOTAL: 2.6 g/dL (ref 1.5–4.5)
Glucose: 127 mg/dL — ABNORMAL HIGH (ref 65–99)
POTASSIUM: 3.8 mmol/L (ref 3.5–5.2)
SODIUM: 139 mmol/L (ref 134–144)
Total Protein: 6.9 g/dL (ref 6.0–8.5)

## 2016-11-10 NOTE — Progress Notes (Signed)
Internal Medicine Clinic Attending  Case discussed with Dr. Patel,Rushil soon after the resident saw the patient.  We reviewed the resident's history and exam and pertinent patient test results.  I agree with the assessment, diagnosis, and plan of care documented in the resident's note. 

## 2016-11-12 MED ORDER — PREGABALIN 75 MG PO CAPS: 75.0000 mg | ORAL_CAPSULE | Freq: Three times a day (TID) | ORAL | 2 refills | Status: DC

## 2016-11-12 NOTE — Addendum Note (Signed)
Addended by: Riccardo Dubin on: 11/12/2016 11:18 AM   Modules accepted: Orders

## 2016-12-02 ENCOUNTER — Encounter: Payer: Self-pay | Admitting: Pharmacist

## 2016-12-02 NOTE — Progress Notes (Signed)
Fax received from [employer] stating patient was approved for the Lyrica patient assistance program, however, they will only accept prescriptions from an attending physician. Worked with Dr. Evette Doffing to fax in a prescription for patient.

## 2017-01-19 ENCOUNTER — Encounter: Payer: Self-pay | Admitting: Internal Medicine

## 2017-01-22 ENCOUNTER — Telehealth: Payer: Self-pay | Admitting: Internal Medicine

## 2017-01-22 NOTE — Telephone Encounter (Signed)
APT. REMINDER CALL, LMTCB °

## 2017-01-23 ENCOUNTER — Ambulatory Visit (INDEPENDENT_AMBULATORY_CARE_PROVIDER_SITE_OTHER): Payer: Self-pay | Admitting: Internal Medicine

## 2017-01-23 ENCOUNTER — Encounter: Payer: Self-pay | Admitting: Internal Medicine

## 2017-01-23 VITALS — BP 128/63 | HR 92 | Temp 97.9°F | Wt 285.0 lb

## 2017-01-23 DIAGNOSIS — R12 Heartburn: Secondary | ICD-10-CM

## 2017-01-23 DIAGNOSIS — Z6841 Body Mass Index (BMI) 40.0 and over, adult: Secondary | ICD-10-CM

## 2017-01-23 DIAGNOSIS — K0889 Other specified disorders of teeth and supporting structures: Secondary | ICD-10-CM

## 2017-01-23 DIAGNOSIS — E1169 Type 2 diabetes mellitus with other specified complication: Secondary | ICD-10-CM

## 2017-01-23 DIAGNOSIS — G629 Polyneuropathy, unspecified: Secondary | ICD-10-CM

## 2017-01-23 DIAGNOSIS — F17210 Nicotine dependence, cigarettes, uncomplicated: Secondary | ICD-10-CM

## 2017-01-23 DIAGNOSIS — Z79899 Other long term (current) drug therapy: Secondary | ICD-10-CM

## 2017-01-23 DIAGNOSIS — K029 Dental caries, unspecified: Secondary | ICD-10-CM

## 2017-01-23 DIAGNOSIS — Z23 Encounter for immunization: Secondary | ICD-10-CM

## 2017-01-23 DIAGNOSIS — F17219 Nicotine dependence, cigarettes, with unspecified nicotine-induced disorders: Secondary | ICD-10-CM

## 2017-01-23 DIAGNOSIS — E1140 Type 2 diabetes mellitus with diabetic neuropathy, unspecified: Secondary | ICD-10-CM

## 2017-01-23 DIAGNOSIS — Z7984 Long term (current) use of oral hypoglycemic drugs: Secondary | ICD-10-CM

## 2017-01-23 LAB — HM DIABETES EYE EXAM

## 2017-01-23 LAB — GLUCOSE, CAPILLARY: Glucose-Capillary: 126 mg/dL — ABNORMAL HIGH (ref 65–99)

## 2017-01-23 LAB — POCT GLYCOSYLATED HEMOGLOBIN (HGB A1C): Hemoglobin A1C: 6.9

## 2017-01-23 MED ORDER — PREGABALIN 75 MG PO CAPS: 75.0000 mg | ORAL_CAPSULE | Freq: Three times a day (TID) | ORAL | 0 refills | Status: DC

## 2017-01-23 MED ORDER — METFORMIN HCL 1000 MG PO TABS: 1000.0000 mg | ORAL_TABLET | Freq: Two times a day (BID) | ORAL | 5 refills | Status: DC

## 2017-01-23 NOTE — Assessment & Plan Note (Signed)
She was provided with TDAp today. She declined flu vaccine stating that it always make her sick. She did get her foot exam today. She did get her retinal/fundus photography today.

## 2017-01-23 NOTE — Assessment & Plan Note (Signed)
Her A1c today was 6.9, increased from 6.3 in November 2017. Patient was stating that she is eating a lot of sweets because of holiday and her mother-in-law at home who loves desserts.  She promised that she she will watch her diet and bring this A1c down with current medicine. P. Continue current dose of metformin at thousand milligrams twice daily and glipizide 5 mg daily. Reassess in 3 months.

## 2017-01-23 NOTE — Assessment & Plan Note (Addendum)
She states that her pain has been dissolved. Has not seen a dentist yet.  On exam  she is having multiple fragmented and missing teeth, no sign of active infection.  I encouraged her to establish with a dentist.

## 2017-01-23 NOTE — Assessment & Plan Note (Signed)
Her neuropathic pain is well controlled with Lyrica 75 mg 3 times a day. Her Lyrica was increased during her previous office visit. She wants another prescription to be faxed to [employer].  P. Continue Lyrica at current dose of 75 mg 3 times a day. 3 month worth of prescription was faxed to [employer].

## 2017-01-23 NOTE — Patient Instructions (Signed)
Thank you for visiting clinic today. Please work on your diet and exercise to reduce your weight and for better control of your diabetes. Please follow-up with [place] in 3 month, if your A1c remains high, we will change your medicines.

## 2017-01-23 NOTE — Progress Notes (Addendum)
   CC: For follow-up of her diabetes.  HPI:  Whitney Wagner is a 41 y.o. with past medical history as listed below, came to the clinic for follow-up of her diabetes. She was complaining of mild intermittent heartburns, for which she takes milk and states that it helped and she do not want any medicine for that. She has no other complaints.  Past Medical History:  Diagnosis Date  . Arthritis    "beginnings in my hands" (05/26/2013)  . Diabetes mellitus without complication ([place])   . GERD (gastroesophageal reflux disease)    otc meds prn  . Migraines, neuralgic    '~ 3/week" (05/26/2013) - otc meds prn  . Morbid obesity ([place])   . Osgood-Schlatter's disease of left knee    left knee  . PCOS (polycystic ovarian syndrome)   . Seasonal allergies   . Tooth pain 05/26/2013   "bottom right" (05/26/2013)  . Uterine fibroid     Review of Systems:  As per HPI.  Physical Exam:  Vitals:   01/23/17 1415  Weight: 285 lb (129.3 kg)   Vitals:   01/23/17 1415  BP: 128/63  Pulse: 92  Temp: 97.9 F (36.6 C)  TempSrc: Oral  SpO2: 99%  Weight: 285 lb (129.3 kg)   General: Vital signs reviewed.  Patient is well-developed and well-nourished, in no acute distress and cooperative with exam.  Head: Normocephalic and atraumatic. Eyes: EOMI, conjunctivae normal, no scleral icterus.  Neck: Supple, trachea midline, normal ROM, no JVD, masses, thyromegaly, or carotid bruit present.  Cardiovascular: RRR, S1 normal, S2 normal, no murmurs, gallops, or rubs. Pulmonary/Chest: Clear to auscultation bilaterally, no wheezes, rales, or rhonchi. Abdominal: Soft, non-tender, non-distended, BS +, no masses, organomegaly, or guarding present.  Musculoskeletal: No joint deformities, erythema, or stiffness, ROM full and nontender. Extremities: No lower extremity edema bilaterally,  pulses symmetric and intact bilaterally. No cyanosis or clubbing. Neurological: A&O x3, Strength is normal and symmetric  bilaterally, cranial nerve II-XII are grossly intact, no focal motor deficit, sensory intact to light touch bilaterally.  Skin: Warm, dry and intact. No rashes or erythema. Psychiatric: Normal mood and affect. speech and behavior is normal. Cognition and memory are normal.  Assessment & Plan:   Her ASCVD risk was 8.8%,she was started on high intensity statins, Lipitor 20 mg daily, will titrate the dose as tolerated.  See Encounters Tab for problem based charting.  Patient Discussed with Dr. Dareen Piano.

## 2017-01-23 NOTE — Assessment & Plan Note (Signed)
She states that she is working on decreasing her nicotine. She uses E cigarettes and so far has decrease her nicotine use from 18 to 6.  I encouraged her to completely quit if possible.

## 2017-01-23 NOTE — Assessment & Plan Note (Signed)
She states that she wants to try herself by decreasing her carb intake.  If she is unable to bring her weight down or her A1c down, we will consider changing her diabetic medicines.

## 2017-01-24 LAB — LIPID PANEL
CHOLESTEROL TOTAL: 196 mg/dL (ref 100–199)
Chol/HDL Ratio: 3.7 ratio units (ref 0.0–4.4)
HDL: 53 mg/dL (ref >39)
LDL Calculated: 112 mg/dL — ABNORMAL HIGH (ref 0–99)
Triglycerides: 155 mg/dL — ABNORMAL HIGH (ref 0–149)
VLDL Cholesterol Cal: 31 mg/dL (ref 5–40)

## 2017-01-24 LAB — MICROALBUMIN / CREATININE URINE RATIO
CREATININE, UR: 95.3 mg/dL
MICROALB/CREAT RATIO: 28.2 mg/g{creat} (ref 0.0–30.0)
MICROALBUM., U, RANDOM: 26.9 ug/mL

## 2017-01-26 ENCOUNTER — Other Ambulatory Visit: Payer: Self-pay | Admitting: Internal Medicine

## 2017-01-26 DIAGNOSIS — E785 Hyperlipidemia, unspecified: Secondary | ICD-10-CM

## 2017-01-26 MED ORDER — ASPIRIN EC 81 MG PO TBEC: 81.0000 mg | DELAYED_RELEASE_TABLET | Freq: Every day | ORAL | 2 refills | Status: AC

## 2017-01-26 MED ORDER — ATORVASTATIN CALCIUM 20 MG PO TABS: 20.0000 mg | ORAL_TABLET | Freq: Every day | ORAL | 3 refills | Status: DC

## 2017-01-26 NOTE — Progress Notes (Signed)
Internal Medicine Clinic Attending  Case discussed with Dr. Amin at the time of the visit.  We reviewed the resident's history and exam and pertinent patient test results.  I agree with the assessment, diagnosis, and plan of care documented in the resident's note.    

## 2017-01-30 ENCOUNTER — Encounter: Payer: Self-pay | Admitting: Dietician

## 2017-02-06 ENCOUNTER — Encounter: Payer: Self-pay | Admitting: Internal Medicine

## 2017-03-23 ENCOUNTER — Other Ambulatory Visit: Payer: Self-pay

## 2017-03-23 NOTE — Telephone Encounter (Signed)
Needs to speak with a nurse about med. Please call back.  

## 2017-03-24 MED ORDER — SULFAMETHOXAZOLE-TRIMETHOPRIM 800-160 MG PO TABS: 1.0000 | ORAL_TABLET | Freq: Two times a day (BID) | ORAL | 0 refills | Status: DC

## 2017-03-24 NOTE — Telephone Encounter (Signed)
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  I called Whitney Wagner and she states she did receive what she believes was an insect bite of the dorsum of her right wrist 2 weeks ago.  She put antibiotic ointment on it and it spontaneously drained with marked improvement.  Unfortunately, there is a dime sized area of hard induration with minimal drainage and a nickel sized area of surrounding redness.  She has no fevers or pain with movement of the wrist because of the lesion.  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  She is well versed in her medical history and easily describes the lesion and her associated symptoms.  I generally do not prescribe antibiotics over the phone, but given her history of similar lesions, her previous response to bactrim DS, and the classic description consistent with a limited soft tissue infection worrisome for a community acquired MRSA infection I will prescribe Bactrim DS 1 tablet BID X 10 days.  She promises to call the clinic Friday morning if there is no improvement by then so that she can be seen before the weekend.

## 2017-03-24 NOTE — Telephone Encounter (Signed)
Insect bite on wrist 2 weeks ago, it has turned into [facility], she would like some bactrim sent in, no appts til fri, she states this always happens

## 2017-04-30 ENCOUNTER — Telehealth: Payer: Self-pay

## 2017-04-30 NOTE — Telephone Encounter (Signed)
UNABLE TO LEAVE MSG

## 2017-05-01 ENCOUNTER — Encounter: Payer: Self-pay | Admitting: Internal Medicine

## 2017-05-01 ENCOUNTER — Ambulatory Visit (INDEPENDENT_AMBULATORY_CARE_PROVIDER_SITE_OTHER): Payer: Self-pay | Admitting: Internal Medicine

## 2017-05-01 ENCOUNTER — Encounter: Payer: Self-pay | Admitting: Pharmacist

## 2017-05-01 VITALS — BP 129/81 | HR 96 | Temp 97.8°F | Wt 286.9 lb

## 2017-05-01 DIAGNOSIS — E669 Obesity, unspecified: Secondary | ICD-10-CM

## 2017-05-01 DIAGNOSIS — Z7984 Long term (current) use of oral hypoglycemic drugs: Secondary | ICD-10-CM

## 2017-05-01 DIAGNOSIS — E1140 Type 2 diabetes mellitus with diabetic neuropathy, unspecified: Secondary | ICD-10-CM

## 2017-05-01 DIAGNOSIS — R21 Rash and other nonspecific skin eruption: Secondary | ICD-10-CM

## 2017-05-01 DIAGNOSIS — G629 Polyneuropathy, unspecified: Secondary | ICD-10-CM

## 2017-05-01 DIAGNOSIS — E1169 Type 2 diabetes mellitus with other specified complication: Secondary | ICD-10-CM

## 2017-05-01 LAB — GLUCOSE, CAPILLARY: Glucose-Capillary: 179 mg/dL — ABNORMAL HIGH (ref 65–99)

## 2017-05-01 LAB — POCT GLYCOSYLATED HEMOGLOBIN (HGB A1C): Hemoglobin A1C: 7.4

## 2017-05-01 MED ORDER — PREGABALIN 100 MG PO CAPS: 100.0000 mg | ORAL_CAPSULE | Freq: Three times a day (TID) | ORAL | 3 refills | Status: DC

## 2017-05-01 MED ORDER — METFORMIN HCL 1000 MG PO TABS: 1000.0000 mg | ORAL_TABLET | Freq: Two times a day (BID) | ORAL | 5 refills | Status: DC

## 2017-05-01 MED ORDER — LIRAGLUTIDE 18 MG/3ML ~~LOC~~ SOPN: 1.2000 mg | PEN_INJECTOR | Freq: Every day | SUBCUTANEOUS | 2 refills | Status: DC

## 2017-05-01 NOTE — Patient Instructions (Addendum)
Thank you for visiting clinic today. As we have changed some of your medications please take them as directed. Please stop taking glyburide. We are starting you on a new medicine for diabetes called Victoza. You will inject as we told you every day at bedtime. Please inject 0.6 mg daily for first 2 weeks and then increase your dose to 1.2 mg daily. Please check your blood sugar regularly and  bring your glucometer meter with your next appointment. We also increased the dose of  Lyrica from 75  To 100 mg 3 times a day. Please follow-up with [place] in 4 weeks.

## 2017-05-01 NOTE — Progress Notes (Signed)
Medication Samples have been provided to the patient.  Drug: Victoza Strength: 18 mg/3 mL Qty: 1 LOT: [id number] Exp.Date: Jan 2019 Dosing instructions: 0.6 mg daily  The patient has been instructed regarding the correct time, dose, and frequency of taking this medication, including desired effects and most common side effects.

## 2017-05-01 NOTE — Assessment & Plan Note (Signed)
Her hemoglobin A1c today was 7.4.  It was increased from her previous reading of 6.9. There is a gradual increase in her A1c over the last 6 month from 6.3-7.4. According to patient she is compliant with her medications. She is still unsuccessful to lose some weight.  P. discontinue glipizide -Start her on Victoza, she was advised to start with 0.6 mg daily for 2 weeks and then increase to 1.2 mg. she was provided with a sample and information for [facility] assistance program. She will follow-up with [place] in 4 weeks. He was advised to check her blood sugar regularly and bring glucometer  during next follow-up visit

## 2017-05-01 NOTE — Assessment & Plan Note (Signed)
She was  complaining of worsening burning pain in her feet especially in her toes. She denies any injury, according to her Lyrica used to control her pain very well recently she noticed that it is coming back.  -Increase Lyrica from 75 to 100 milligrams 3 times a day. -Reassess during next follow-up visit in 4 weeks.

## 2017-05-01 NOTE — Assessment & Plan Note (Signed)
She was  complaining of pruritic rash spread out in her all limbs and groin area. She thinks that she had a poison ivy as her roommate was diagnosed recently with poison ivy and they both share bed.  On exam.She has multiple small erythematous rashes on her limbs and in her groin area. There is erythematous rash under her breast. No blistering or exudate.  A. rash does not appear to be due to poison ivy, most likely due to bug bites. Under her breast area was most likely due to heat rash.  P. She was advised to wash her clothes and bedding with   hot water.she can also use over-the-counter hydrocortisone cream for itching.

## 2017-05-01 NOTE — Progress Notes (Signed)
   CC: For her diabetes follow up.  HPI:  [name] is a 41 y.o. with past medical history as listed below came to the clinic for follow-up of her diabetes.  She was also complaining of worsening burning pain in her feet especially in her toes. She denies any injury, according to her Lyrica used to control her pain very well recently she noticed that it is coming back.  She was also complaining of pruritic rash spread out in her all limbs and groin area. She thinks that she had a poison ivy as her roommate was diagnosed recently with poison ivy and they both share bed.  She denies any other symptoms.  Past Medical History:  Diagnosis Date  . Arthritis    "beginnings in my hands" (05/26/2013)  . Diabetes mellitus without complication ([place]) 3009  . GERD (gastroesophageal reflux disease)    otc meds prn  . Migraines, neuralgic    '~ 3/week" (05/26/2013) - otc meds prn  . Morbid obesity ([place])   . Osgood-Schlatter's disease of left knee    left knee  . PCOS (polycystic ovarian syndrome)   . Seasonal allergies   . Tooth pain 05/26/2013   "bottom right" (05/26/2013)  . Uterine fibroid     Review of Systems:  AS per HPI.  Physical Exam:  Vitals:   05/01/17 1514 05/01/17 1542  BP: (!) 164/104 129/81  Pulse: (!) 110 96  Temp: 97.8 F (36.6 C)   TempSrc: Oral   SpO2: 98%   Weight: 286 lb 14.4 oz (130.1 kg)     General: Vital signs reviewed.  Patient is well-developed and well-nourished,Obese, in no acute distress and cooperative with exam.  Head: Normocephalic and atraumatic. Eyes: EOMI, conjunctivae normal, no scleral icterus.  Neck: Supple, trachea midline, normal ROM, no JVD, masses, thyromegaly, or carotid bruit present.  Cardiovascular: RRR, S1 normal, S2 normal, no murmurs, gallops, or rubs. Pulmonary/Chest: Clear to auscultation bilaterally, no wheezes, rales, or rhonchi. Abdominal: Soft, non-tender, non-distended, BS +, no masses, organomegaly, or guarding  present.  Extremities: No lower extremity edema bilaterally,  pulses symmetric and intact bilaterally. No cyanosis or clubbing. Neurological: A&O x3, Strength is normal and symmetric bilaterally, cranial nerve II-XII are grossly intact, no focal motor deficit, sensory intact to light touch bilaterally.  Skin: She has multiple small erythematous rashes on her limbs and in her groin area. There is erythematous rash under her breast. No blistering or exudate. Psychiatric: Normal mood and affect. speech and behavior is normal. Cognition and memory are normal.  Assessment & Plan:   See Encounters Tab for problem based charting.  Patient seen with Dr. Angelia Mould.

## 2017-05-05 NOTE — Progress Notes (Signed)
Internal Medicine Clinic Attending  Case discussed with Dr. Amin at the time of the visit.  We reviewed the resident's history and exam and pertinent patient test results.  I agree with the assessment, diagnosis, and plan of care documented in the resident's note.    

## 2017-05-14 ENCOUNTER — Encounter: Payer: Self-pay | Admitting: Pharmacist

## 2017-05-14 ENCOUNTER — Ambulatory Visit: Payer: Self-pay | Admitting: Pharmacist

## 2017-05-14 DIAGNOSIS — Z79899 Other long term (current) drug therapy: Secondary | ICD-10-CM

## 2017-05-14 NOTE — Progress Notes (Signed)
Patient was referred to [facility]  Medication Samples have been provided to the patient.  Drug: liraglutide Strength: 18 mg/45mL Qty: 1 LOT: [id number] Exp.Date: Jan 2019 Dosing instructions: 1.2 mg daily  The patient has been instructed regarding the correct time, dose, and frequency of taking this medication, including desired effects and most common side effects.   [name] 3:04 PM 05/14/2017

## 2017-05-21 ENCOUNTER — Telehealth: Payer: Self-pay | Admitting: *Deleted

## 2017-05-21 NOTE — Telephone Encounter (Signed)
Pt calls and states that after going up on the [place] last Friday, starting sat she started having diarrhea and N&V, yesterday she vomited 6 times, she stopped the victoza last night and today the problems are resolving She states that she would rather increase the metformin but she will not take the victoza again. Please advise

## 2017-05-21 NOTE — Telephone Encounter (Signed)
Called pt gave her dr Latina Craver message, she was agreeable

## 2017-05-21 NOTE — Telephone Encounter (Signed)
That's fine, we can increase her metformin during her next follow-up visit.

## 2017-06-03 ENCOUNTER — Other Ambulatory Visit: Payer: Self-pay | Admitting: Oncology

## 2017-06-03 ENCOUNTER — Encounter: Payer: Self-pay | Admitting: Internal Medicine

## 2017-06-03 ENCOUNTER — Ambulatory Visit (INDEPENDENT_AMBULATORY_CARE_PROVIDER_SITE_OTHER): Payer: Self-pay | Admitting: Internal Medicine

## 2017-06-03 VITALS — BP 136/76 | HR 102 | Temp 98.4°F | Wt 282.6 lb

## 2017-06-03 DIAGNOSIS — G629 Polyneuropathy, unspecified: Secondary | ICD-10-CM

## 2017-06-03 DIAGNOSIS — M25531 Pain in right wrist: Secondary | ICD-10-CM

## 2017-06-03 DIAGNOSIS — M25532 Pain in left wrist: Secondary | ICD-10-CM

## 2017-06-03 DIAGNOSIS — E1169 Type 2 diabetes mellitus with other specified complication: Secondary | ICD-10-CM

## 2017-06-03 DIAGNOSIS — E1140 Type 2 diabetes mellitus with diabetic neuropathy, unspecified: Secondary | ICD-10-CM

## 2017-06-03 DIAGNOSIS — M25521 Pain in right elbow: Secondary | ICD-10-CM

## 2017-06-03 DIAGNOSIS — M25522 Pain in left elbow: Secondary | ICD-10-CM

## 2017-06-03 DIAGNOSIS — Z7984 Long term (current) use of oral hypoglycemic drugs: Secondary | ICD-10-CM

## 2017-06-03 DIAGNOSIS — E669 Obesity, unspecified: Secondary | ICD-10-CM

## 2017-06-03 MED ORDER — SITAGLIPTIN PHOSPHATE 100 MG PO TABS: 100.0000 mg | ORAL_TABLET | Freq: Every day | ORAL | 2 refills | Status: DC

## 2017-06-03 MED ORDER — NAPROXEN 500 MG PO TABS: 500.0000 mg | ORAL_TABLET | Freq: Two times a day (BID) | ORAL | 2 refills | Status: DC

## 2017-06-03 MED ORDER — PREGABALIN 100 MG PO CAPS: 100.0000 mg | ORAL_CAPSULE | Freq: Three times a day (TID) | ORAL | 3 refills | Status: DC

## 2017-06-03 NOTE — Progress Notes (Signed)
   CC: For follow-up of her diabetes.  HPI:  Whitney Wagner is a 41 y.o. with past medical history as listed below came to the clinic for follow-up of her diabetes.  She was recently started on Victoza as her A1c was 7.4 and she was obese. According to patient she did tolerated 0.6 mg except mild nausea, but as she increased her dose after 1 week to 1.2 mg she developed diarrhea and vomiting. She do not want to take Victoza anymore. She did brought her glucometer meter, her CBG mostly stays between 150-270. Currently she is just using metformin. She is willing to try a new medication or even insulin. She is trying to follow low-carb diet but often slips off.  Her neuropathic pain was improved after increasing the dose of Lyrica. She needs renewal of her patient assistant program for Lyrica, which was provided by Dr. Maudie Mercury.  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 Pain normally is worse at the end of the day on days when she is working more.  She had no other complaints.  Past Medical History:  Diagnosis Date  . Arthritis    "beginnings in my hands" (05/26/2013)  . Diabetes mellitus without complication ([place]) 8811  . GERD (gastroesophageal reflux disease)    otc meds prn  . Migraines, neuralgic    '~ 3/week" (05/26/2013) - otc meds prn  . Morbid obesity ([place])   . Osgood-Schlatter's disease of left knee    left knee  . PCOS (polycystic ovarian syndrome)   . Seasonal allergies   . Tooth pain 05/26/2013   "bottom right" (05/26/2013)  . Uterine fibroid     Review of Systems:  AS per HPI.  Physical Exam:  Vitals:   06/03/17 1359  BP: 136/76  Pulse: (!) 102  Temp: 98.4 F (36.9 C)  TempSrc: Oral  SpO2: 98%  Weight: 282 lb 9.6 oz (128.2 kg)   Vitals:   06/03/17 1359  BP: 136/76  Pulse: (!) 102  Temp: 98.4 F (36.9 C)  TempSrc: Oral  SpO2: 98%   Weight: 282 lb 9.6 oz (128.2 kg)   General: Vital signs reviewed.  Patient is well-developed and well-nourished, in no acute distress and cooperative with exam.  Cardiovascular: RRR, S1 normal, S2 normal, no murmurs, gallops, or rubs. Pulmonary/Chest: Clear to auscultation bilaterally, no wheezes, rales, or rhonchi. Abdominal: Soft, non-tender, non-distended, BS +, no masses, organomegaly, or guarding present.  Musculoskeletal: No joint deformities, erythema, or stiffness, ROM full and nontender. Extremities: No lower extremity edema bilaterally,  pulses symmetric and intact bilaterally. No cyanosis or clubbing.  Assessment & Plan:   See Encounters Tab for problem based charting.  Patient discussed with Dr. Beryle Beams.

## 2017-06-03 NOTE — Patient Instructions (Signed)
Thank you for visiting clinic today. As we talked, I'm adding another medication called Januvia, please take it as directed. Please follow-up in 4 weeks and bring your glucometer meter with you.

## 2017-06-04 ENCOUNTER — Other Ambulatory Visit: Payer: Self-pay | Admitting: Internal Medicine

## 2017-06-04 ENCOUNTER — Telehealth: Payer: Self-pay

## 2017-06-04 DIAGNOSIS — M25531 Pain in right wrist: Secondary | ICD-10-CM | POA: Insufficient documentation

## 2017-06-04 DIAGNOSIS — M25532 Pain in left wrist: Principal | ICD-10-CM

## 2017-06-04 MED ORDER — "INSULIN SYRINGE 28G X 1/2"" 0.5 ML MISC": 20.0000 [IU] | Freq: Every day | 2 refills | Status: DC

## 2017-06-04 MED ORDER — INSULIN DETEMIR 100 UNIT/ML ~~LOC~~ SOLN: 20.0000 [IU] | Freq: Every day | SUBCUTANEOUS | 11 refills | Status: DC

## 2017-06-04 NOTE — Assessment & Plan Note (Addendum)
She was recently started on Victoza as her A1c was 7.4 and she was obese. According to patient she did tolerated 0.6 mg except mild nausea, but as she increased her dose after 1 week to 1.2 mg she developed diarrhea and vomiting. She do not want to take Victoza anymore. She did brought her glucometer meter, her CBG mostly stays between 150-270. Currently she is just using metformin. She is willing to try a new medication or even insulin. She is trying to follow low-carb diet but often slips off.  -Continue metformin thousand milligrams twice daily. -Add Januvia 100 mg daily-if she tolerate it well and her A1c comes within goal, we can combine her metformin and Januvia and give her a prescription of Janumet.  Addendum. Patient called the next day and told that she do not want to use Januvia. She would rather prefer Levemir. Prescription for Levemir was provided.

## 2017-06-04 NOTE — Assessment & Plan Note (Signed)
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 Pain normally is worse at the end of the day on days when she is working more.  -Naproxen 500 mg twice a day when necessary.

## 2017-06-04 NOTE — Telephone Encounter (Signed)
I did order Levemir for her. I'll discontinue Januvia.

## 2017-06-04 NOTE — Telephone Encounter (Signed)
Requesting to speak with a nurse about meds. Please call pt back.  

## 2017-06-04 NOTE — Telephone Encounter (Signed)
Pt calls and states after going home and doing research she feels the [place] is not something she wants to use, she would prefer to use levemir, could you please send a script for that? [facility] to [facility] under [facility] program.

## 2017-06-04 NOTE — Assessment & Plan Note (Signed)
Her neuropathic pain was improved after increasing the dose of Lyrica. She needs renewal of her patient assistant program for Lyrica, which was provided by Dr. Maudie Mercury.  Continue current dose of Lyrica at 100 mg 3 times a day.

## 2017-06-05 NOTE — Progress Notes (Signed)
Medicine attending: Medical history, presenting problems, physical findings, and medications, reviewed with resident physician Dr Sumayya Amin on the day of the patient visit and I concur with her evaluation and management plan. 

## 2017-06-05 NOTE — Telephone Encounter (Signed)
Pt informed

## 2017-06-09 ENCOUNTER — Telehealth: Payer: Self-pay | Admitting: Internal Medicine

## 2017-06-09 MED FILL — LEVEMIR 100 UNITS/ML VIAL: 100 | 42 days supply | Qty: 10 | Fill #0

## 2017-06-09 NOTE — Telephone Encounter (Signed)
Called to [facility], made pt aware

## 2017-06-09 NOTE — Telephone Encounter (Signed)
Patient states her Levemir is still not at the [facility].  Please call patient back.

## 2017-07-03 ENCOUNTER — Ambulatory Visit (INDEPENDENT_AMBULATORY_CARE_PROVIDER_SITE_OTHER): Payer: Self-pay | Admitting: Internal Medicine

## 2017-07-03 ENCOUNTER — Encounter: Payer: Self-pay | Admitting: Internal Medicine

## 2017-07-03 VITALS — BP 136/90 | HR 88 | Temp 98.2°F | Ht 62.0 in | Wt 283.7 lb

## 2017-07-03 DIAGNOSIS — M25532 Pain in left wrist: Secondary | ICD-10-CM

## 2017-07-03 DIAGNOSIS — Z794 Long term (current) use of insulin: Secondary | ICD-10-CM

## 2017-07-03 DIAGNOSIS — G629 Polyneuropathy, unspecified: Secondary | ICD-10-CM

## 2017-07-03 DIAGNOSIS — E1169 Type 2 diabetes mellitus with other specified complication: Secondary | ICD-10-CM

## 2017-07-03 DIAGNOSIS — M25531 Pain in right wrist: Secondary | ICD-10-CM

## 2017-07-03 DIAGNOSIS — E1140 Type 2 diabetes mellitus with diabetic neuropathy, unspecified: Secondary | ICD-10-CM

## 2017-07-03 LAB — POCT GLYCOSYLATED HEMOGLOBIN (HGB A1C): HEMOGLOBIN A1C: 7.6

## 2017-07-03 LAB — GLUCOSE, CAPILLARY: Glucose-Capillary: 147 mg/dL — ABNORMAL HIGH (ref 65–99)

## 2017-07-03 MED ORDER — INSULIN DETEMIR 100 UNIT/ML ~~LOC~~ SOLN: 25.0000 [IU] | Freq: Every day | SUBCUTANEOUS | 11 refills | Status: DC

## 2017-07-03 NOTE — Patient Instructions (Signed)
Thank you for visiting clinic today. I'm increasing the dose of your Levemir, now you will take 25 units at bedtime. Please continue to check your blood sugar and bring your glucometer meter which her next appointment in about 4 weeks.

## 2017-07-03 NOTE — Assessment & Plan Note (Signed)
She still have some tingling of her feet mostly in her toes. She states that her pain is improved with the increase of Lyrica.  Continue current dose of Lyrica 100 mg 3 times a day.

## 2017-07-03 NOTE — Assessment & Plan Note (Signed)
It was most likely because of overuse and degenerative arthritis. Her pain is improved with naproxen, she was taking naproxen  twice a day.  I advised her to try taking it as needed basis.

## 2017-07-03 NOTE — Progress Notes (Signed)
   CC: For follow-up of her diabetes.  HPI:  Whitney Wagner is a 41 y.o. with past medical history as listed below came to the clinic for follow-up of her diabetes, as she was unable to tolerate Victoza, Levemir 20 units at bedtime was added to her regimen during last office visit.   Past Medical History:  Diagnosis Date  . Arthritis    "beginnings in my hands" (05/26/2013)  . Diabetes mellitus without complication ([place]) 9833  . GERD (gastroesophageal reflux disease)    otc meds prn  . Migraines, neuralgic    '~ 3/week" (05/26/2013) - otc meds prn  . Morbid obesity ([place])   . Osgood-Schlatter's disease of left knee    left knee  . PCOS (polycystic ovarian syndrome)   . Seasonal allergies   . Tooth pain 05/26/2013   "bottom right" (05/26/2013)  . Uterine fibroid    Review of Systems:  As per HPI.  Physical Exam:  There were no vitals filed for this visit. Vitals:   07/03/17 1508  BP: (!) 142/88  Pulse: 89  Temp: 98.2 F (36.8 C)  TempSrc: Oral  SpO2: 100%  Weight: 283 lb 11.2 oz (128.7 kg)   General: Vital signs reviewed.  Patient is well-developed and well-nourished, in no acute distress and cooperative with exam.  Cardiovascular: RRR, S1 normal, S2 normal, no murmurs, gallops, or rubs. Pulmonary/Chest: Clear to auscultation bilaterally, no wheezes, rales, or rhonchi. Abdominal: Soft, non-tender, non-distended, BS +, no masses, organomegaly, or guarding present.  Musculoskeletal: No joint deformities, erythema, or stiffness, ROM full and nontender. Extremities: No lower extremity edema bilaterally,  pulses symmetric and intact bilaterally. No cyanosis or clubbing.   Assessment & Plan:   See Encounters Tab for problem based charting.  Patient discussed with Dr. Dareen Piano.

## 2017-07-03 NOTE — Assessment & Plan Note (Signed)
Her A1c today was 7.6, it'll increase from the previous one of 7.4. She did bring her glucometer meter which shows average blood glucose of 197 with a range of 114-362. According to patient she is trying to stay compliant with a low carb diet, which is sometimes very hard for her and she do slips off.  She is currently on her below average dose of basal. Her basal dose ranges between 25-64 units according to her weight.  Increase Levemir to 25 units at bedtime. Follow-up in 4 weeks. She was advised to keep checking her blood sugar and bring the glucometer meter with her.

## 2017-07-07 NOTE — Progress Notes (Signed)
Internal Medicine Clinic Attending  Case discussed with Dr. Amin at the time of the visit.  We reviewed the resident's history and exam and pertinent patient test results.  I agree with the assessment, diagnosis, and plan of care documented in the resident's note.    

## 2017-07-16 MED FILL — LEVEMIR 100 UNITS/ML VIAL: 100 | 42 days supply | Qty: 10 | Fill #1

## 2017-08-07 ENCOUNTER — Other Ambulatory Visit: Payer: Self-pay | Admitting: *Deleted

## 2017-08-07 ENCOUNTER — Ambulatory Visit (INDEPENDENT_AMBULATORY_CARE_PROVIDER_SITE_OTHER): Payer: Self-pay | Admitting: Internal Medicine

## 2017-08-07 ENCOUNTER — Encounter: Payer: Self-pay | Admitting: Internal Medicine

## 2017-08-07 VITALS — BP 122/75 | HR 95 | Temp 98.4°F | Ht 62.0 in | Wt 283.5 lb

## 2017-08-07 DIAGNOSIS — E1140 Type 2 diabetes mellitus with diabetic neuropathy, unspecified: Secondary | ICD-10-CM

## 2017-08-07 DIAGNOSIS — M542 Cervicalgia: Secondary | ICD-10-CM

## 2017-08-07 DIAGNOSIS — R350 Frequency of micturition: Secondary | ICD-10-CM

## 2017-08-07 DIAGNOSIS — R200 Anesthesia of skin: Secondary | ICD-10-CM

## 2017-08-07 DIAGNOSIS — Z9119 Patient's noncompliance with other medical treatment and regimen: Secondary | ICD-10-CM

## 2017-08-07 DIAGNOSIS — Z794 Long term (current) use of insulin: Secondary | ICD-10-CM

## 2017-08-07 DIAGNOSIS — E1169 Type 2 diabetes mellitus with other specified complication: Secondary | ICD-10-CM

## 2017-08-07 DIAGNOSIS — R0683 Snoring: Secondary | ICD-10-CM

## 2017-08-07 DIAGNOSIS — Z79899 Other long term (current) drug therapy: Secondary | ICD-10-CM

## 2017-08-07 DIAGNOSIS — G629 Polyneuropathy, unspecified: Secondary | ICD-10-CM

## 2017-08-07 DIAGNOSIS — R300 Dysuria: Secondary | ICD-10-CM

## 2017-08-07 DIAGNOSIS — R202 Paresthesia of skin: Secondary | ICD-10-CM

## 2017-08-07 DIAGNOSIS — R1030 Lower abdominal pain, unspecified: Secondary | ICD-10-CM

## 2017-08-07 LAB — GLUCOSE, CAPILLARY: Glucose-Capillary: 170 mg/dL — ABNORMAL HIGH (ref 65–99)

## 2017-08-07 MED ORDER — METFORMIN HCL 1000 MG PO TABS: 1000.0000 mg | ORAL_TABLET | Freq: Two times a day (BID) | ORAL | 5 refills | Status: DC

## 2017-08-07 MED ORDER — INSULIN DETEMIR 100 UNIT/ML ~~LOC~~ SOLN: 30.0000 [IU] | Freq: Every day | SUBCUTANEOUS | 11 refills | Status: DC

## 2017-08-07 MED ORDER — MELOXICAM 15 MG PO TABS: 15.0000 mg | ORAL_TABLET | Freq: Every day | ORAL | 2 refills | Status: DC

## 2017-08-07 MED FILL — metFORMIN HCL 1000 MG TABS: 1000 | 30 days supply | Qty: 60 | Fill #0

## 2017-08-07 MED FILL — MELOXICAM 15 MG TABLET: 15 | 30 days supply | Qty: 30 | Fill #0

## 2017-08-07 MED FILL — LEVEMIR 100 UNITS/ML VIAL: 100 | 30 days supply | Qty: 10 | Fill #0

## 2017-08-07 NOTE — Assessment & Plan Note (Signed)
According to patient that neuropathic pain in her legs is under good control with Lyrica.   Continue Lyrica at 100 mg 3 times a day.

## 2017-08-07 NOTE — Assessment & Plan Note (Signed)
She did bring her glucometer meter today, which shows elevated blood glucose level with average of 189 and range of 132-285. According to patient she is not very compliant with the diet, currently having some stresses because of the illness of her mother and heat whatever she can grab on. She denied any hypoglycemic event.  She was encouraged to follow her dietary plan. Increase  Levemir to 30 units at bedtime. Continue metformin thousand milligrams twice daily. If she continued to have uncontrolled diabetes during next follow-up visit we can add Januvia with metformin and gave her a prescription of Janumet.

## 2017-08-07 NOTE — Assessment & Plan Note (Signed)
She is complaining of neck pain radiating to right shoulder and arm along with some tingling and numbness for about 2 weeks.  Her exam was completely normal.  Most likely due to excessive strain at work and abnormal positioning while sleeping.  She was encouraged to use a good pillow, change her sleeping pattern, use heating pad and give her shoulders and neck at rest periodically while working. -She was also given a prescription for Mobic, as naproxen was not really working currently.

## 2017-08-07 NOTE — Patient Instructions (Addendum)
  Thank you for visiting clinic today. Please increase your Levemir to 30 units at bedtime. Please exercise regularly, watch her diet and try to lose some weight that will help with the control of your blood sugar. For your neck use heating pad, avoid the posture which is causing this type of pain, tried to get a good pillow which will give good support. We will check urine and will call you with any abnormal results. Please follow-up in 3 month.

## 2017-08-07 NOTE — Assessment & Plan Note (Signed)
Her symptoms of dysuria, increased frequency and lower abdominal pain of might be due to UTI. She has no fever and does not appear sick.  Check UA. We will call her with results.

## 2017-08-07 NOTE — Progress Notes (Signed)
   CC: For follow-up of her diabetes.  HPI:  Whitney Wagner is a 42 y.o. lady with past medical history as listed below came to the clinic for follow-up of her diabetes.  She was complaining of lower abdominal and lower back pain along with some dysuria and urinary frequency for one week. She denies any fever or chills. She denies any hematuria.  She was also complaining of neck pain radiating to left shoulder and left arm, associated with mild tingling and numbness of right arm and hand. She denied any focal weakness or restriction in her arm movement. According to patient her numbness is mostly in the morning when she gets up from the sleep, she normally sleeps on her right arm and trying to change her habit. Patient is also working in [employer] and recently using a machine which is causing a lot of extra strain on her neck and shoulders.  She was also complaining of excessive snoring, feeling tired in the morning and excessive sleepiness during the day. Currently she do not have any insurance, she is working on getting one, she will wait for sleep study until she will get an insurance.  Past Medical History:  Diagnosis Date  . Arthritis    "beginnings in my hands" (05/26/2013)  . Diabetes mellitus without complication ([employer]) 6659  . GERD (gastroesophageal reflux disease)    otc meds prn  . Migraines, neuralgic    '~ 3/week" (05/26/2013) - otc meds prn  . Morbid obesity ([place])   . Osgood-Schlatter's disease of left knee    left knee  . PCOS (polycystic ovarian syndrome)   . Seasonal allergies   . Tooth pain 05/26/2013   "bottom right" (05/26/2013)  . Uterine fibroid    Review of Systems:  As HPI  Physical Exam:  Vitals:   08/07/17 1456  BP: 122/75  Pulse: 95  Temp: 98.4 F (36.9 C)  TempSrc: Oral  SpO2: 99%  Weight: 283 lb 8 oz (128.6 kg)  Height: 5\' 2"  (1.575 m)   General: Vital signs reviewed.  Patient is well-developed and well-nourished, in no acute  distress and cooperative with exam.  Head: Normocephalic and atraumatic. Eyes: EOMI, conjunctivae normal, no scleral icterus.  Cardiovascular: RRR, S1 normal, S2 normal, no murmurs, gallops, or rubs. Pulmonary/Chest: Clear to auscultation bilaterally, no wheezes, rales, or rhonchi. Abdominal: Soft, non-tender, non-distended, BS +, no masses, organomegaly, or guarding present.  Musculoskeletal: Mild cervical tenderness, range of motion full at both shoulders, strength and sensations intact bilaterally in upper limb. Extremities: No lower extremity edema bilaterally,  pulses symmetric and intact bilaterally. No cyanosis or clubbing. Skin: Warm, dry and intact. No rashes or erythema. Psychiatric: Normal mood and affect. speech and behavior is normal. Cognition and memory are normal.  Assessment & Plan:   See Encounters Tab for problem based charting.  Patient discussed with Dr. Dareen Piano.

## 2017-08-08 LAB — URINALYSIS, ROUTINE W REFLEX MICROSCOPIC
BILIRUBIN UA: NEGATIVE
GLUCOSE, UA: NEGATIVE
Leukocytes, UA: NEGATIVE
NITRITE UA: NEGATIVE
RBC UA: NEGATIVE
UUROB: 0.2 mg/dL (ref 0.2–1.0)
pH, UA: 5 (ref 5.0–7.5)

## 2017-08-13 NOTE — Progress Notes (Signed)
Internal Medicine Clinic Attending  Case discussed with Dr. Amin at the time of the visit.  We reviewed the resident's history and exam and pertinent patient test results.  I agree with the assessment, diagnosis, and plan of care documented in the resident's note.    

## 2017-09-28 MED FILL — LEVEMIR 100 UNITS/ML VIAL: 100 | 30 days supply | Qty: 10 | Fill #1

## 2017-09-28 MED FILL — MELOXICAM 15 MG TABLET: 15 | 30 days supply | Qty: 30 | Fill #1

## 2017-09-28 MED FILL — metFORMIN HCL 1000 MG TABS: 1000 | 30 days supply | Qty: 60 | Fill #1

## 2017-10-27 ENCOUNTER — Other Ambulatory Visit: Payer: Self-pay

## 2017-10-27 ENCOUNTER — Other Ambulatory Visit: Payer: Self-pay | Admitting: Internal Medicine

## 2017-10-27 NOTE — Telephone Encounter (Signed)
She should have enough refills on file.

## 2017-10-27 NOTE — Telephone Encounter (Signed)
pregabalin (LYRICA) 100 MG capsule, Refill request.

## 2017-10-28 NOTE — Telephone Encounter (Signed)
Checked medlist, called pharmacy they never got the script, gave verbally

## 2017-10-29 ENCOUNTER — Other Ambulatory Visit: Payer: Self-pay | Admitting: Internal Medicine

## 2017-10-29 DIAGNOSIS — G629 Polyneuropathy, unspecified: Secondary | ICD-10-CM

## 2017-10-29 DIAGNOSIS — E1169 Type 2 diabetes mellitus with other specified complication: Secondary | ICD-10-CM

## 2017-10-29 NOTE — Telephone Encounter (Signed)
Pt would like a call back in reference to her Lyrica.

## 2017-10-30 MED ORDER — PREGABALIN 100 MG PO CAPS: 100.0000 mg | ORAL_CAPSULE | Freq: Three times a day (TID) | ORAL | 3 refills | Status: DC

## 2017-10-30 NOTE — Telephone Encounter (Signed)
Lyrica rx faxed to [phone] as instructed by Mannie Stabile.

## 2017-10-30 NOTE — Telephone Encounter (Signed)
Pt states the company will not accept rx from residents, has to be from [facility]. Can u print another rx.? Then I can fax rx to the number provided by Mannie Stabile. Thanks

## 2017-10-30 NOTE — Telephone Encounter (Signed)
Pt states she needs a refill on Lyrica but it needs to go to [facility]. And she had talked to Mannie Stabile. I will send request to Dr Maudie Mercury and Dr Reesa Chew.

## 2017-10-30 NOTE — Telephone Encounter (Signed)
Hi Glenda, The Lyrica prescription can be faxed to [phone]. I will be in Monday to help if needed, just let me know.  Thank you

## 2017-10-31 NOTE — Telephone Encounter (Signed)
Thanks

## 2017-11-05 MED FILL — metFORMIN HCL 1000 MG TABS: 1000 | 30 days supply | Qty: 60 | Fill #2

## 2017-11-05 MED FILL — LEVEMIR 100 UNITS/ML VIAL: 100 | 30 days supply | Qty: 10 | Fill #2

## 2017-11-05 MED FILL — MELOXICAM 15 MG TABLET: 15 | 30 days supply | Qty: 30 | Fill #2

## 2017-11-06 ENCOUNTER — Ambulatory Visit: Payer: Self-pay | Admitting: Internal Medicine

## 2017-11-06 ENCOUNTER — Encounter: Payer: Self-pay | Admitting: Internal Medicine

## 2017-11-06 ENCOUNTER — Other Ambulatory Visit: Payer: Self-pay

## 2017-11-06 VITALS — BP 105/60 | HR 88 | Temp 97.9°F | Ht 62.0 in | Wt 283.4 lb

## 2017-11-06 DIAGNOSIS — M7701 Medial epicondylitis, right elbow: Secondary | ICD-10-CM | POA: Insufficient documentation

## 2017-11-06 DIAGNOSIS — M25532 Pain in left wrist: Secondary | ICD-10-CM

## 2017-11-06 DIAGNOSIS — G629 Polyneuropathy, unspecified: Secondary | ICD-10-CM

## 2017-11-06 DIAGNOSIS — Z79899 Other long term (current) drug therapy: Secondary | ICD-10-CM

## 2017-11-06 DIAGNOSIS — Z791 Long term (current) use of non-steroidal anti-inflammatories (NSAID): Secondary | ICD-10-CM

## 2017-11-06 DIAGNOSIS — Z794 Long term (current) use of insulin: Secondary | ICD-10-CM

## 2017-11-06 DIAGNOSIS — M542 Cervicalgia: Secondary | ICD-10-CM

## 2017-11-06 DIAGNOSIS — M25531 Pain in right wrist: Secondary | ICD-10-CM

## 2017-11-06 DIAGNOSIS — E1169 Type 2 diabetes mellitus with other specified complication: Secondary | ICD-10-CM

## 2017-11-06 DIAGNOSIS — M25511 Pain in right shoulder: Secondary | ICD-10-CM

## 2017-11-06 DIAGNOSIS — E1140 Type 2 diabetes mellitus with diabetic neuropathy, unspecified: Secondary | ICD-10-CM

## 2017-11-06 HISTORY — DX: Medial epicondylitis, right elbow: M77.01

## 2017-11-06 LAB — POCT GLYCOSYLATED HEMOGLOBIN (HGB A1C): HEMOGLOBIN A1C: 7.9

## 2017-11-06 LAB — GLUCOSE, CAPILLARY: GLUCOSE-CAPILLARY: 164 mg/dL — AB (ref 65–99)

## 2017-11-06 MED ORDER — MELOXICAM 15 MG PO TABS: 15.0000 mg | ORAL_TABLET | Freq: Every day | ORAL | 2 refills | Status: DC

## 2017-11-06 MED ORDER — "INSULIN SYRINGE 28G X 1/2"" 0.5 ML MISC": 32.0000 [IU] | Freq: Every day | 2 refills | Status: DC

## 2017-11-06 MED FILL — ULTICARE SYR 1 ML 30GX5/16: 30G X 5/16" | 30 days supply | Qty: 100 | Fill #0

## 2017-11-06 MED FILL — ULTICARE SYR 1 ML 30GX5/16": 30G X 5/16" | 30 days supply | Qty: 100 | Fill #0

## 2017-11-06 NOTE — Patient Instructions (Addendum)
Thank you for visiting clinic today. As we discussed I'm increasing the dose of few of Levemir to 32 units at bedtime, please follow your diet to keep your diabetes under good control. 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 Please follow-up in 3 month.   Golfer's Elbow Rehab Ask your health care provider which exercises are safe for you. Do exercises exactly as told by your health care provider and adjust them as directed. It is normal to feel mild stretching, pulling, tightness, or discomfort as you do these exercises, but you should stop right away if you feel sudden pain or your pain gets worse. Do not begin these exercises until told by your health care provider. Stretching and range of motion exercises These exercises warm up your muscles and joints and improve the movement and flexibility of your elbow. Exercise A: Wrist flexors  1. Straighten your left / right elbow in front of you with your palm up. If told by your health care provider, do this stretch with your elbow bent rather than straight. 2. With your other hand, gently pull your left / right hand and fingers toward you until you feel a gentle stretch on the top of your forearm. 3. Hold this position for __________ seconds. Repeat __________ times. Complete this exercise __________ times a day. Strengthening exercises These exercises build strength and endurance in your elbow. Endurance is the ability to use your muscles for a long time, even after several repetitions. Exercise B: Wrist flexion  1. Sit with your left / right forearm palm-up and supported on a table or other surface. 2. Let your left / right wrist extend over the edge of the surface. 3. Hold a __________ weight or a piece of rubber exercise band or tubing. Slowly curl your hand up toward your forearm. Try to only move your hand and keep the rest of  your arm still. 4. Hold this position for __________ seconds. 5. Slowly return to the starting position. Repeat __________ times. Complete this exercise __________ times a day. Exercise C: Eccentric wrist flexion 1. Sit with your left / right forearm palm-up and supported on a table or other surface. 2. Let your left / right wrist extend over the edge of the surface. 3. Hold a __________ weight or a piece of rubber exercise band or tubing. 4. Use your other hand to move your left / right hand up toward your forearm. 5. Slowly return to the starting position using only your left / right hand. Repeat __________ times. Complete this exercise __________ times a day. Exercise D: Hand turns, pronation i 1. Sit with your left / right forearm supported on a table or other surface. 2. Move your wrist so your pinkie travels toward your forearm and your thumb moves away from your forearm. 3. Hold this position for __________ seconds. 4. Slowly return to the starting position. Exercise E: Hand turns, pronation ii  1. Sit with your left / right forearm supported on a table or other surface. 2. Hold a hammer in your left / right hand. ? The exercise will be easier if you hold on near the head of the hammer. ? If you hold on toward the end of the handle, the exercise will be harder. 3. Rest your left / right hand over the edge of the table with your palm facing up. 4. Without moving your elbow,  slowly turn your palm and your hand down toward the table. 5. Hold this position for __________ seconds. 6. Slowly return to the starting position. Repeat __________ times. Complete this exercise __________ times a day. Exercise F: Shoulder blade squeeze 1. Sit in a stable chair with good posture. Do not let your back touch the back of the chair. 2. Your arms should be at your sides with your elbows bent. You can rest your forearms on a pillow. 3. Squeeze your shoulder blades together. Keep your shoulders  level. Do not lift your shoulders up toward your ears. 4. Hold this position for __________ seconds. 5. Slowly release. Repeat __________ times. Complete this exercise __________ times a day. This information is not intended to replace advice given to you by your health care provider. Make sure you discuss any questions you have with your health care provider. Document Released: 12/08/2005 Document Revised: 08/14/2016 Document Reviewed: 08/20/2015 Elsevier Interactive Patient Education  2018 [place] Golfer's elbow, also called medial epicondylitis, is a condition that results from inflammation of the strong bands of tissue (tendons) that attach your forearm muscles to the inside of your bone at the elbow. These tendons affect the muscles that bend the palm toward the wrist (flexion). This condition is called golfer's elbow because it is more common among people who constantly bend and twist their wrists, such as golfers. This injury usually results from overuse. Tendons also become less flexible with age. This condition causes elbow pain that may spread to your forearm and upper arm. The pain may get worse when you bend your wrist downward. What are the causes? This condition is an overuse injury that is caused by:  Repeatedly flexing, turning, or twisting your wrist.  Constantly gripping objects with your hands.  What increases the risk? This condition is more likely to develop in people who play golf or tennis or have jobs that require the constant use of their hands. This injury is more common among:  Carpenters.  Gardeners.  Musicians.  Bricklayers.  Typists.  What are the signs or symptoms? Symptoms of this condition include:  Pain near the inner elbow or forearm.  Reduced grip strength.  How is this diagnosed? This condition is diagnosed based on your symptoms, medical history, and physical exam. During the exam, your health care provider may test  your grip strength and move your wrist to check for pain. You may also have an MRI to confirm the diagnosis, look for other issues, and check for tears in the ligaments, muscles, or tendons. How is this treated? Treatment for this condition includes:  Stopping all activities that make you bend or twist your wrist until your pain and other symptoms go away.  Icing your wrist to relieve pain.  Taking NSAIDs or getting corticosteroid injections to reduce pain and swelling.  Doing stretches, range-of-motion, and strengthening exercises (physical therapy) as told by your health care provider.  In rare cases, surgery may be needed if your condition does not improve. Follow these instructions at home:  If directed, apply ice to the injured area. ? Put ice in a plastic bag. ? Place a towel between your skin and the bag. ? Leave the ice on for 20 minutes, 2-3 times a day.  Move your fingers often to avoid stiffness.  Raise (elevate) the injured area above the level of your heart while you are sitting or lying down.  Return to your normal activities as told by your health care provider.  Ask your health care provider what activities are safe for you.  Do exercises as told by your health care provider.  Do not use tobacco products, including cigarettes, chewing tobacco, or e-cigarettes. If you need help quitting, ask your health care provider.  Take over-the-counter and prescription medicines only as told by your health care provider.  Keep all follow-up visits as told by your health care provider. This is important. How is this prevented?  Warm up and stretch before being active.  Cool down and stretch after being active.  Give your body time to rest between periods of activity.  Make sure to use equipment that fits you.  Be safe and responsible while being active to avoid falls.  Do at least 150 minutes of moderate-intensity exercise each week, such as brisk walking or water  aerobics.  Maintain physical fitness, including: ? Strength. ? Flexibility. ? Cardiovascular fitness. ? Endurance.  Perform exercises to strengthen the forearm muscles.  Slow your golf swing to reduce shock in the arm when making contact with the ball, if you play golf. Contact a health care provider if:  Your pain does not improve or it gets worse.  You notice numbness in your hand. Get help right away if:  Your pain is severe.  You cannot move your wrist. This information is not intended to replace advice given to you by your health care provider. Make sure you discuss any questions you have with your health care provider. Document Released: 12/08/2005 Document Revised: 08/12/2016 Document Reviewed: 08/20/2015 Elsevier Interactive Patient Education  [employer].

## 2017-11-06 NOTE — Assessment & Plan Note (Signed)
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  She was provided with some exercises to help with that pain. She was also advised to use golfers elbow brace while working to help with pain.  Currently she is on Mobic 15 mg daily and asking to increase the dose. I advised her to use Tylenol in between as she is on maximum dose.  We will reevaluate her pain during next follow-up visit, steroid injection can be done if needed.

## 2017-11-06 NOTE — Assessment & Plan Note (Signed)
Her A1c today was 7.9, elevated from the previous check of 7.6. Patient currently on Levemir 30 units at bedtime and metformin 1000 mg twice daily. According to patient her mom is recently diagnosed with advanced stage pancreatic cancer and is in hospital, she is eating a lot of junk and fast food because of that and under a lot of stress.  -I counseled her again regarding healthy eating. -Increase Levemir to 32 units at bedtime. -Continue metformin 1000 mg twice daily.

## 2017-11-06 NOTE — Assessment & Plan Note (Signed)
Her neuropathic symptoms are well controlled with Lyrica 100 mg 3 times a day.  -Continue Lyrica 100 mg 3 times daily.

## 2017-11-06 NOTE — Assessment & Plan Note (Signed)
She continued to experience pain in her right wrist, although improved with the wrist brace. Her work required a lot of movement around right wrist, elbow and shoulder joint.  She was advised to add Tylenol along with Mobic if needed. We will check BMP for renal function.

## 2017-11-06 NOTE — Progress Notes (Signed)
   CC: For follow-up of her diabetes and right arm pain.  HPI:  [name] is a 41 y.o. with past medical history as listed below came to the clinic for follow-up of her diabetes and right arm pain.  She was complaining of worsening pain around her right elbow, denies any erythema or edema, describing it as continuous dull ache, tenderness along medial epicondyles, she was asking for steroid injection. She continued to experience pain in right shoulder and right wrist while working in [employer]. Her pain is accompanied with tingling and numbness of her 4 fingers and palm. She uses a wrist brace while working which helps with the wrist pain.  Please see assessment and plan section for her chronic problems.  Past Medical History:  Diagnosis Date  . Arthritis    "beginnings in my hands" (05/26/2013)  . Diabetes mellitus without complication ([place]) 2202  . GERD (gastroesophageal reflux disease)    otc meds prn  . Migraines, neuralgic    '~ 3/week" (05/26/2013) - otc meds prn  . Morbid obesity ([place])   . Osgood-Schlatter's disease of left knee    left knee  . PCOS (polycystic ovarian syndrome)   . Seasonal allergies   . Tooth pain 05/26/2013   "bottom right" (05/26/2013)  . Uterine fibroid    Review of Systems:  As per HPI.  Physical Exam:  Vitals:   11/06/17 1429  BP: 105/60  Pulse: 88  Temp: 97.9 F (36.6 C)  TempSrc: Oral  SpO2: 99%  Weight: 283 lb 6.4 oz (128.5 kg)  Height: 5\' 2"  (1.575 m)    General: Vital signs reviewed.  Patient is well-developed and well-nourished, in no acute distress and cooperative with exam.  Head: Normocephalic and atraumatic. Eyes: EOMI, conjunctivae normal, no scleral icterus.  Cardiovascular: RRR, S1 normal, S2 normal, no murmurs, gallops, or rubs. Pulmonary/Chest: Clear to auscultation bilaterally, no wheezes, rales, or rhonchi. Abdominal: Soft, non-tender, non-distended, BS +, no masses, organomegaly, or guarding present.    Musculoskeletal: Nonrestricted range of motion at right arm along elbow and wrist, do experience subjective pain with medial rotation at rest, mild tenderness along medial epicondyles. Extremities: No lower extremity edema bilaterally,  pulses symmetric and intact bilaterally. No cyanosis or clubbing. Skin: Warm, dry and intact. No rashes or erythema. Psychiatric: Normal mood and affect. speech and behavior is normal. Cognition and memory are normal.  Assessment & Plan:   See Encounters Tab for problem based charting.  Patient seen with Dr. Angelia Mould

## 2017-11-07 LAB — BMP8+ANION GAP
Anion Gap: 17 mmol/L (ref 10.0–18.0)
BUN / CREAT RATIO: 27 — AB (ref 9–23)
BUN: 13 mg/dL (ref 6–24)
CHLORIDE: 100 mmol/L (ref 96–106)
CO2: 24 mmol/L (ref 20–29)
CREATININE: 0.49 mg/dL — AB (ref 0.57–1.00)
Calcium: 9.5 mg/dL (ref 8.7–10.2)
GFR calc non Af Amer: 121 mL/min/{1.73_m2} (ref >59)
GFR, EST AFRICAN AMERICAN: 140 mL/min/{1.73_m2} (ref >59)
GLUCOSE: 152 mg/dL — AB (ref 65–99)
Potassium: 4.2 mmol/L (ref 3.5–5.2)
SODIUM: 141 mmol/L (ref 134–144)

## 2017-11-10 NOTE — Progress Notes (Addendum)
Internal Medicine Clinic Attending  I saw and evaluated the patient.  I personally confirmed the key portions of the history and exam documented by Dr. Amin and I reviewed pertinent patient test results.  The assessment, diagnosis, and plan were formulated together and I agree with the documentation in the resident's note. 

## 2017-12-18 MED FILL — metFORMIN HCL 1000 MG TABS: 1000 | 30 days supply | Qty: 60 | Fill #3

## 2017-12-18 MED FILL — MELOXICAM 15 MG TABLET: 15 | 30 days supply | Qty: 30 | Fill #0

## 2017-12-18 MED FILL — LEVEMIR 100 UNITS/ML VIAL: 100 | 30 days supply | Qty: 10 | Fill #3

## 2018-01-13 ENCOUNTER — Other Ambulatory Visit: Payer: Self-pay | Admitting: Internal Medicine

## 2018-01-13 MED ORDER — FLUCONAZOLE 150 MG PO TABS
150.0000 mg | ORAL_TABLET | Freq: Every day | ORAL | 0 refills | Status: DC
Start: 2018-01-13 — End: 2019-05-02

## 2018-01-13 NOTE — Telephone Encounter (Signed)
Patient wants medication for yeast infection ,to  [facility] [phone]

## 2018-01-18 MED FILL — LEVEMIR 100 UNITS/ML VIAL: 100 | 30 days supply | Qty: 10 | Fill #4

## 2018-01-18 MED FILL — metFORMIN HCL 1000 MG TABS: 1000 | 30 days supply | Qty: 60 | Fill #4

## 2018-01-18 MED FILL — MELOXICAM 15 MG TABLET: 15 | 30 days supply | Qty: 30 | Fill #1

## 2018-01-29 ENCOUNTER — Other Ambulatory Visit: Payer: Self-pay

## 2018-01-29 ENCOUNTER — Encounter: Payer: Self-pay | Admitting: Internal Medicine

## 2018-01-29 ENCOUNTER — Ambulatory Visit: Payer: Self-pay | Admitting: Internal Medicine

## 2018-01-29 VITALS — BP 132/79 | HR 87 | Temp 97.7°F | Ht 62.0 in | Wt 285.7 lb

## 2018-01-29 DIAGNOSIS — E119 Type 2 diabetes mellitus without complications: Secondary | ICD-10-CM

## 2018-01-29 DIAGNOSIS — E118 Type 2 diabetes mellitus with unspecified complications: Secondary | ICD-10-CM

## 2018-01-29 DIAGNOSIS — Z794 Long term (current) use of insulin: Secondary | ICD-10-CM

## 2018-01-29 DIAGNOSIS — M7701 Medial epicondylitis, right elbow: Secondary | ICD-10-CM

## 2018-01-29 DIAGNOSIS — Z1239 Encounter for other screening for malignant neoplasm of breast: Secondary | ICD-10-CM

## 2018-01-29 DIAGNOSIS — G629 Polyneuropathy, unspecified: Secondary | ICD-10-CM

## 2018-01-29 DIAGNOSIS — M542 Cervicalgia: Secondary | ICD-10-CM

## 2018-01-29 LAB — GLUCOSE, CAPILLARY: Glucose-Capillary: 120 mg/dL — ABNORMAL HIGH (ref 65–99)

## 2018-01-29 LAB — POCT GLYCOSYLATED HEMOGLOBIN (HGB A1C): Hemoglobin A1C: 8

## 2018-01-29 MED ORDER — SITAGLIPTIN-METFORMIN HCL 50-1000 MG PO TABS
1.0000 | ORAL_TABLET | Freq: Two times a day (BID) | ORAL | 2 refills | Status: DC
Start: 2018-01-29 — End: 2018-04-29

## 2018-01-29 MED ORDER — CYCLOBENZAPRINE HCL 5 MG PO TABS: 5.0000 mg | ORAL_TABLET | Freq: Three times a day (TID) | ORAL | 0 refills | Status: DC | PRN

## 2018-01-29 MED FILL — JANUMET 50-1,000 MG TABLET: 50-1000 | 30 days supply | Qty: 60 | Fill #0

## 2018-01-29 MED FILL — CYCLOBENZAPRINE 5 MG TABLET: 5 | 10 days supply | Qty: 30 | Fill #0

## 2018-01-29 NOTE — Assessment & Plan Note (Signed)
Her neuropathic pain is well controlled on Lyrica.  -Continue Lyrica.

## 2018-01-29 NOTE — Assessment & Plan Note (Signed)
Most likely musculoskeletal.  No red flag.  -She was given a prescription of Flexeril to be used as needed.

## 2018-01-29 NOTE — Assessment & Plan Note (Signed)
Her A1c today was 8.0, it was 7.9 3 month ago. Patient does not check her blood sugar regularly and does not watch her diet.  She loves to drink orange juice, stating that she cannot help it.  She also does not exercise, stating that after work she is too tired to do anything.  -We have a long discussion regarding possible diabetes complications that can affect her life later if her blood sugar remained uncontrolled. -She was also offered a health from [place], which she declined stating that she knows what she is doing wrong and will try to fix herself. -Her blood pressure was mildly elevated today too, we had a long discussion regarding watching her diet both for diabetes and low-sodium and exercise regularly to avoid going on any antihypertensive. -We will check microalbumin urea-if it is positive, she will benefit with the start of a ACE inhibitor which can help with her blood pressure and give her renal protection. -Discontinue Metformin. -Start her on Janumet 50-1000 twice daily. -Continue Lantus 32 units at bedtime.

## 2018-01-29 NOTE — Assessment & Plan Note (Signed)
She continued to get intermittent right elbow pain, worse with more work. She was using an elbow brace to help, according to patient that the brace does help with her pain and symptoms. Mobic does provided good pain relief.  -She was advised to continue using brace and using Mobic as needed.

## 2018-01-29 NOTE — Assessment & Plan Note (Signed)
She refused to see a dietitian at this time. She continued to gain weight-2 pounds up from previous visit.  She was encouraged again to watch her diet and exercise regularly to lose some weight as it will help with her diabetes and a possible hypertension in the future.

## 2018-01-29 NOTE — Progress Notes (Signed)
   CC: Follow-up of diabetes, right arm pain.  HPI:  [name] is a 42 y.o. past medical history as listed below came to the clinic for follow-up of his right arm pain.  She was complaining of pain in her neck for the past 1-2 days, nonradiating, denies any focal weakness, tingling or numbness.  She also has a small accident where her laundry door fell on her left leg causing a big bruise and swelling.  Her swelling and pain subside with icing.  She was able to ambulate well.  Please see assessment and plan for her chronic conditions.   Past Medical History:  Diagnosis Date  . Arthritis    "beginnings in my hands" (05/26/2013)  . Diabetes mellitus without complication ([place]) 3710  . GERD (gastroesophageal reflux disease)    otc meds prn  . Migraines, neuralgic    '~ 3/week" (05/26/2013) - otc meds prn  . Morbid obesity ([place])   . Osgood-Schlatter's disease of left knee    left knee  . PCOS (polycystic ovarian syndrome)   . Seasonal allergies   . Tooth pain 05/26/2013   "bottom right" (05/26/2013)  . Uterine fibroid    Review of Systems: Negative except mentioned in HPI.  Physical Exam:  Vitals:   01/29/18 1528  BP: 132/79  Pulse: 87  Temp: 97.7 F (36.5 C)  TempSrc: Oral  SpO2: 100%  Weight: 285 lb 11.2 oz (129.6 kg)  Height: 5\' 2"  (1.575 m)    General: Vital signs reviewed.  Patient is well-developed and well-nourished, in no acute distress and cooperative with exam.  Head: Normocephalic and atraumatic. Eyes: EOMI, conjunctivae normal, no scleral icterus.  Cardiovascular: RRR, S1 normal, S2 normal, no murmurs, gallops, or rubs. Pulmonary/Chest: Clear to auscultation bilaterally, no wheezes, rales, or rhonchi. Abdominal: Soft, non-tender, non-distended, BS +, no masses, organomegaly, or guarding present.  Musculoskeletal: No joint deformities, erythema, or stiffness, ROM full and nontender. Extremities: No lower extremity edema bilaterally,  pulses symmetric  and intact bilaterally. No cyanosis or clubbing. Neurological: A&O x3, Strength is normal and symmetric bilaterally, cranial nerve II-XII are grossly intact, no focal motor deficit, sensory intact to light touch bilaterally.  Skin: Warm, dry and intact.  A healing bruise on posterior lateral upper left leg. Psychiatric: Normal mood and affect. speech and behavior is normal. Cognition and memory are normal.  Assessment & Plan:   See Encounters Tab for problem based charting.  Patient discussed with Dr. Lynnae January.

## 2018-01-29 NOTE — Patient Instructions (Addendum)
Thank you for visiting today. As we discussed I am changing your Metformin to Janumet.  Please take 1 tablet twice daily. Please watch your diet and exercise regularly so you can lose some weight that will help with your blood pressure and blood sugar both. As we discussed your blood pressure was little high today-I want you to change her diet to a low sodium diet at this time and exercise, if it remains high you might need a blood pressure medicine. We will also check your urine for any protein-I will call you with any abnormal results. I am giving you Flexeril which she you can take it as needed for your neck pain. Today we are providing you with information to get a free mammogram-please make your appointment at your convenience. Please follow-up in 22-month   DASH Eating Plan DASH stands for "Dietary Approaches to Stop Hypertension." The DASH eating plan is a healthy eating plan that has been shown to reduce high blood pressure (hypertension). It may also reduce your risk for type 2 diabetes, heart disease, and stroke. The DASH eating plan may also help with weight loss. What are tips for following this plan? General guidelines  Avoid eating more than 2,300 mg (milligrams) of salt (sodium) a day. If you have hypertension, you may need to reduce your sodium intake to 1,500 mg a day.  Limit alcohol intake to no more than 1 drink a day for nonpregnant women and 2 drinks a day for men. One drink equals 12 oz of beer, 5 oz of wine, or 1 oz of hard liquor.  Work with your health care provider to maintain a healthy body weight or to lose weight. Ask what an ideal weight is for you.  Get at least 30 minutes of exercise that causes your heart to beat faster (aerobic exercise) most days of the week. Activities may include walking, swimming, or biking.  Work with your health care provider or diet and nutrition specialist (dietitian) to adjust your eating plan to your individual calorie  needs. Reading food labels  Check food labels for the amount of sodium per serving. Choose foods with less than 5 percent of the Daily Value of sodium. Generally, foods with less than 300 mg of sodium per serving fit into this eating plan.  To find whole grains, look for the word "whole" as the first word in the ingredient list. Shopping  Buy products labeled as "low-sodium" or "no salt added."  Buy fresh foods. Avoid canned foods and premade or frozen meals. Cooking  Avoid adding salt when cooking. Use salt-free seasonings or herbs instead of table salt or sea salt. Check with your health care provider or pharmacist before using salt substitutes.  Do not fry foods. Cook foods using healthy methods such as baking, boiling, grilling, and broiling instead.  Cook with heart-healthy oils, such as olive, canola, soybean, or sunflower oil. Meal planning   Eat a balanced diet that includes: ? 5 or more servings of fruits and vegetables each day. At each meal, try to fill half of your plate with fruits and vegetables. ? Up to 6-8 servings of whole grains each day. ? Less than 6 oz of lean meat, poultry, or fish each day. A 3-oz serving of meat is about the same size as a deck of cards. One egg equals 1 oz. ? 2 servings of low-fat dairy each day. ? A serving of nuts, seeds, or beans 5 times each week. ? Heart-healthy fats. Healthy fats called  Omega-3 fatty acids are found in foods such as flaxseeds and coldwater fish, like sardines, salmon, and mackerel.  Limit how much you eat of the following: ? Canned or prepackaged foods. ? Food that is high in trans fat, such as fried foods. ? Food that is high in saturated fat, such as fatty meat. ? Sweets, desserts, sugary drinks, and other foods with added sugar. ? Full-fat dairy products.  Do not salt foods before eating.  Try to eat at least 2 vegetarian meals each week.  Eat more home-cooked food and less restaurant, buffet, and fast  food.  When eating at a restaurant, ask that your food be prepared with less salt or no salt, if possible. What foods are recommended? The items listed may not be a complete list. Talk with your dietitian about what dietary choices are best for you. Grains Whole-grain or whole-wheat bread. Whole-grain or whole-wheat pasta. Brown rice. [place]. Bulgur. Whole-grain and low-sodium cereals. Pita bread. Low-fat, low-sodium crackers. Whole-wheat flour tortillas. Vegetables Fresh or frozen vegetables (raw, steamed, roasted, or grilled). Low-sodium or reduced-sodium tomato and vegetable juice. Low-sodium or reduced-sodium tomato sauce and tomato paste. Low-sodium or reduced-sodium canned vegetables. Fruits All fresh, dried, or frozen fruit. Canned fruit in natural juice (without added sugar). Meat and other protein foods Skinless chicken or [place]. Ground chicken or [place]. Pork with fat trimmed off. Fish and seafood. Egg whites. Dried beans, peas, or lentils. Unsalted nuts, nut butters, and seeds. Unsalted canned beans. Lean cuts of beef with fat trimmed off. Low-sodium, lean deli meat. Dairy Low-fat (1%) or fat-free (skim) milk. Fat-free, low-fat, or reduced-fat cheeses. Nonfat, low-sodium ricotta or cottage cheese. Low-fat or nonfat yogurt. Low-fat, low-sodium cheese. Fats and oils Soft margarine without trans fats. Vegetable oil. Low-fat, reduced-fat, or light mayonnaise and salad dressings (reduced-sodium). Canola, safflower, olive, soybean, and sunflower oils. Avocado. Seasoning and other foods Herbs. Spices. Seasoning mixes without salt. Unsalted popcorn and pretzels. Fat-free sweets. What foods are not recommended? The items listed may not be a complete list. Talk with your dietitian about what dietary choices are best for you. Grains Baked goods made with fat, such as croissants, muffins, or some breads. Dry pasta or rice meal packs. Vegetables Creamed or fried vegetables. Vegetables  in a cheese sauce. Regular canned vegetables (not low-sodium or reduced-sodium). Regular canned tomato sauce and paste (not low-sodium or reduced-sodium). Regular tomato and vegetable juice (not low-sodium or reduced-sodium). Angie Fava. Olives. Fruits Canned fruit in a light or heavy syrup. Fried fruit. Fruit in cream or butter sauce. Meat and other protein foods Fatty cuts of meat. Ribs. Fried meat. Berniece Salines. Sausage. Bologna and other processed lunch meats. Salami. Fatback. Hotdogs. Bratwurst. Salted nuts and seeds. Canned beans with added salt. Canned or smoked fish. Whole eggs or egg yolks. Chicken or [place] with skin. Dairy Whole or 2% milk, cream, and half-and-half. Whole or full-fat cream cheese. Whole-fat or sweetened yogurt. Full-fat cheese. Nondairy creamers. Whipped toppings. Processed cheese and cheese spreads. Fats and oils Butter. Stick margarine. Lard. Shortening. Ghee. Bacon fat. Tropical oils, such as coconut, palm kernel, or palm oil. Seasoning and other foods Salted popcorn and pretzels. Onion salt, garlic salt, seasoned salt, table salt, and sea salt. Worcestershire sauce. Tartar sauce. Barbecue sauce. Teriyaki sauce. Soy sauce, including reduced-sodium. Steak sauce. Canned and packaged gravies. Fish sauce. Oyster sauce. Cocktail sauce. Horseradish that you find on the shelf. Ketchup. Mustard. Meat flavorings and tenderizers. Bouillon cubes. Hot sauce and Tabasco sauce. Premade or packaged marinades. Premade  or packaged taco seasonings. Relishes. Regular salad dressings. Where to find more information:  National Heart, Lung, and [place]: https://wilson-eaton.com/  American Heart Association: www.heart.org Summary  The DASH eating plan is a healthy eating plan that has been shown to reduce high blood pressure (hypertension). It may also reduce your risk for type 2 diabetes, heart disease, and stroke.  With the DASH eating plan, you should limit salt (sodium) intake to 2,300 mg a  day. If you have hypertension, you may need to reduce your sodium intake to 1,500 mg a day.  When on the DASH eating plan, aim to eat more fresh fruits and vegetables, whole grains, lean proteins, low-fat dairy, and heart-healthy fats.  Work with your health care provider or diet and nutrition specialist (dietitian) to adjust your eating plan to your individual calorie needs. This information is not intended to replace advice given to you by your health care provider. Make sure you discuss any questions you have with your health care provider. Document Released: 11/27/2011 Document Revised: 12/01/2016 Document Reviewed: 12/01/2016 Elsevier Interactive Patient Education  2018 [employer].  Metformin; Sitagliptin Oral Tablets What is this medicine? METFORMIN; SITAGLIPTIN (met FOR min; sit a GLIP tin) is a combination of 2 medicines used to treat type 2 diabetes. This medicine lowers blood sugar. Treatment is combined with a balanced diet and exercise. This medicine may be used for other purposes; ask your health care provider or pharmacist if you have questions. COMMON BRAND NAME(S): Janumet What should I tell my health care provider before I take this medicine? They need to know if you have any of these conditions: -become easily dehydrated -diabetic ketoacidosis -frequently drink alcohol-containing beverages -heart disease, past heart attack -kidney disease -liver disease -pancreatitis -polycystic ovary syndrome -previous swelling of the tongue, face, or lips with difficulty breathing, difficulty swallowing, hoarseness, or tightening of the throat -serious infection or injury -thyroid disease -undergoing surgery or certain x-ray procedures with injectable contrast agents -an unusual or allergic reaction to metformin, sitagliptin, other medicines, foods, dyes, or preservatives -pregnant or trying to get pregnant -breast-feeding How should I use this medicine? Take this medicine by  mouth with a glass of water. Follow the directions on the prescription label. Take this medicine with food. Take your doses at regular intervals. Do not take your medicine more often than directed. Do not stop taking except on your doctor's advice. A special MedGuide will be given to you by the pharmacist with each prescription and refill. Be sure to read this information carefully each time. Talk to your pediatrician regarding the use of this medicine in children. Special care may be needed. Overdosage: If you think you have taken too much of this medicine contact a poison control center or emergency room at once. NOTE: This medicine is only for you. Do not share this medicine with others. What if I miss a dose? If you miss a dose, take it as soon as you can. If it is almost time for your next dose, take only that dose. Do not take double or extra doses. What may interact with this medicine? Do not take this medicine with any of the following medications: -dofetilide -gatifloxacin -certain contrast medicines given before X-rays, CT scans, MRI, or other procedures This medicine may also interact with the following medications: -acetazolamide -amiloride -certain antiviral medicines for HIV infection or hepatitis -cimetidine -crizotinib -digoxin -diuretics -female hormones, like estrogens or progestins and birth control pills -glycopyrrolate -isoniazid -lamotrigine -medicines for blood pressure, heart disease,  irregular heart beat -memantine -methazolamide -midodrine -morphine -nicotinic acid -phenothiazines like chlorpromazine, mesoridazine, prochlorperazine, thioridazine -phenytoin -procainamide -propantheline -quinidine -quinine -ranitidine -ranolazine -steroid medicines like prednisone or cortisone -stimulant medicines for attention disorders, weight loss, or to stay awake -thyroid medicines -topiramate -trimethoprim -trospium -vancomycin -vandetanib -zonisamide This  list may not describe all possible interactions. Give your health care provider a list of all the medicines, herbs, non-prescription drugs, or dietary supplements you use. Also tell them if you smoke, drink alcohol, or use illegal drugs. Some items may interact with your medicine. What should I watch for while using this medicine? Visit your doctor or health care professional for regular checks on your progress. A test called the HbA1C (A1C) will be monitored. This is a simple blood test. It measures your blood sugar control over the last 2 to 3 months. You will receive this test every 3 to 6 months. Learn how to check your blood sugar. Learn the symptoms of high or low blood sugar and how to manage them. Always carry a quick-source of sugar with you in case you have symptoms of low blood sugar. Examples include hard sugar candy or glucose tablets. Make sure others know that you can choke if you eat or drink when you develop serious symptoms of low blood sugar, such as seizures or unconsciousness. They must get medical help at once. Tell your doctor or health care professional if you have high blood sugar. You might need to change the dose of your medicine. If you are sick or exercising more than usual, you might need to change the dose of your medicine. Do not skip meals. Ask your doctor or health care professional if you should avoid alcohol. Many nonprescription cough and cold products contain sugar or alcohol. These can affect blood sugar. This medicine may cause ovulation in premenopausal women who do not have regular monthly periods. This may increase your chances of becoming pregnant. You should not take this medicine if you become pregnant or think you may be pregnant. Talk with your doctor or health care professional about your birth control options while taking this medicine. Contact your doctor or health care professional right away if think you are pregnant. If you are going to need surgery, a  MRI, CT scan, or other procedure, tell your doctor that you are taking this medicine. You may need to stop taking this medicine before the procedure. Wear a medical ID bracelet or chain, and carry a card that describes your disease and details of your medicine and dosage times. What side effects may I notice from receiving this medicine? Side effects that you should report to your doctor or health care professional as soon as possible: -allergic reactions like skin rash, itching or hives, swelling of the face, lips, or tongue -breathing problems -feeling faint or lightheaded, falls -fever, chills -joint pain -loss of appetite -muscle aches or pains -nausea, vomiting -signs and symptoms of low blood sugar such as feeling anxious, confusion, dizziness, increased hunger, unusually weak or tired, sweating, shakiness, cold, irritable, headache, blurred vision, fast heartbeat, loss of consciousness -slow or irregular heartbeat -swelling of the hands, legs, and/or feet -unusual stomach pain or discomfort Side effects that usually do not require medical attention (report to your doctor or health care professional if they continue or are bothersome): -diarrhea -headache -metallic taste in the mouth -stomach gas -stuffy or runny nose This list may not describe all possible side effects. Call your doctor for medical advice about side effects. You may  report side effects to FDA at 1-800-FDA-1088. Where should I keep my medicine? Keep out of the reach of children. Store at room temperature between 15 and 30 degrees C (59 and 86 degrees F). Throw away any unused medicine after the expiration date. NOTE: This sheet is a summary. It may not cover all possible information. If you have questions about this medicine, talk to your doctor, pharmacist, or health care provider.  2018 Elsevier/Gold Standard (2014-08-18 17:51:37)

## 2018-01-30 LAB — MICROALBUMIN / CREATININE URINE RATIO
CREATININE, UR: 241.6 mg/dL
Microalb/Creat Ratio: 10.9 mg/g creat (ref 0.0–30.0)
Microalbumin, Urine: 26.4 ug/mL

## 2018-02-01 NOTE — Progress Notes (Signed)
Internal Medicine Clinic Attending  Case discussed with Dr. Amin at the time of the visit.  We reviewed the resident's history and exam and pertinent patient test results.  I agree with the assessment, diagnosis, and plan of care documented in the resident's note.    

## 2018-02-15 MED FILL — LEVEMIR 100 UNITS/ML VIAL: 100 | 42 days supply | Qty: 10 | Fill #2

## 2018-02-15 MED FILL — MELOXICAM 15 MG TABLET: 15 | 30 days supply | Qty: 30 | Fill #2

## 2018-03-03 ENCOUNTER — Other Ambulatory Visit: Payer: Self-pay | Admitting: Internal Medicine

## 2018-03-03 DIAGNOSIS — M542 Cervicalgia: Secondary | ICD-10-CM

## 2018-03-03 MED FILL — JANUMET 50-1,000 MG TABLET: 50-1000 | 30 days supply | Qty: 60 | Fill #1

## 2018-03-03 MED FILL — CYCLOBENZAPRINE 5 MG TABLET: 5 | 10 days supply | Qty: 30 | Fill #0

## 2018-03-26 ENCOUNTER — Other Ambulatory Visit: Payer: Self-pay | Admitting: Internal Medicine

## 2018-03-26 DIAGNOSIS — M542 Cervicalgia: Secondary | ICD-10-CM

## 2018-03-26 MED FILL — MELOXICAM 15 MG TABLET: 15 | 30 days supply | Qty: 30 | Fill #0

## 2018-03-26 MED FILL — LEVEMIR 100 UNITS/ML VIAL: 100 | 42 days supply | Qty: 10 | Fill #3

## 2018-03-26 MED FILL — JANUMET 50-1,000 MG TABLET: 50-1000 | 30 days supply | Qty: 60 | Fill #2

## 2018-04-12 MED FILL — CYCLOBENZAPRINE 5 MG TABLET: 5 | 10 days supply | Qty: 30 | Fill #0

## 2018-04-16 ENCOUNTER — Ambulatory Visit: Payer: Self-pay | Admitting: Internal Medicine

## 2018-04-16 ENCOUNTER — Encounter: Payer: Self-pay | Admitting: Internal Medicine

## 2018-04-16 ENCOUNTER — Other Ambulatory Visit: Payer: Self-pay

## 2018-04-16 VITALS — BP 149/97 | HR 93 | Temp 98.1°F | Ht 62.0 in | Wt 285.8 lb

## 2018-04-16 DIAGNOSIS — E119 Type 2 diabetes mellitus without complications: Secondary | ICD-10-CM

## 2018-04-16 DIAGNOSIS — E1165 Type 2 diabetes mellitus with hyperglycemia: Secondary | ICD-10-CM

## 2018-04-16 DIAGNOSIS — H60503 Unspecified acute noninfective otitis externa, bilateral: Secondary | ICD-10-CM | POA: Insufficient documentation

## 2018-04-16 DIAGNOSIS — R030 Elevated blood-pressure reading, without diagnosis of hypertension: Secondary | ICD-10-CM

## 2018-04-16 DIAGNOSIS — Z794 Long term (current) use of insulin: Secondary | ICD-10-CM

## 2018-04-16 DIAGNOSIS — H6093 Unspecified otitis externa, bilateral: Secondary | ICD-10-CM

## 2018-04-16 LAB — GLUCOSE, CAPILLARY: Glucose-Capillary: 162 mg/dL — ABNORMAL HIGH (ref 65–99)

## 2018-04-16 LAB — POCT GLYCOSYLATED HEMOGLOBIN (HGB A1C): Hemoglobin A1C: 7.7

## 2018-04-16 MED ORDER — NEOMYCIN-POLYMYXIN-HC 1 % OT SOLN: 3.0000 [drp] | Freq: Four times a day (QID) | OTIC | 0 refills | Status: DC

## 2018-04-16 MED ORDER — FLUTICASONE PROPIONATE 50 MCG/ACT NA SUSP: 1.0000 | Freq: Every day | NASAL | 2 refills | Status: DC

## 2018-04-16 MED ORDER — CIPROFLOXACIN HCL 500 MG PO TABS: 500.0000 mg | ORAL_TABLET | Freq: Two times a day (BID) | ORAL | 0 refills | Status: AC

## 2018-04-16 MED FILL — CIPROFLOXACIN HCL 500 MG TA: 500 | 14 days supply | Qty: 28 | Fill #0

## 2018-04-16 MED FILL — FLUTICASONE PROP 50 MCG SPR: 50 | 60 days supply | Qty: 16 | Fill #0

## 2018-04-16 MED FILL — NEO/POLYMYXIN/HC EAR SOLN: 3.5-10000-1 | 8 days supply | Qty: 10 | Fill #0

## 2018-04-16 NOTE — Assessment & Plan Note (Signed)
BP Readings from Last 3 Encounters:  04/16/18 (!) 149/97  01/29/18 132/79  11/06/17 105/60   Blood pressure was elevated today, she was in pain which can cause elevation in blood pressure.  -We will reevaluate during next follow-up visit. -If remained elevated she will get benefit with starting ACE inhibitor.

## 2018-04-16 NOTE — Assessment & Plan Note (Signed)
She is mostly compliant with her medications, does not check her blood sugar very regularly. For couple of weeks after the death of her mother she was not taking her medications regularly or following her diabetic diet.  For the past 2 to 3 weeks she restarted taking her medications and following her diet.  A1c done in the clinic today was 7.7, somewhat improved from 8.0 on previous check.  CBG was 162.  Continue current management with Janumet and Levemir 30 units at bedtime. She was encouraged to continue taking her medications and watch her diet. Follow-up in 23-month.

## 2018-04-16 NOTE — Patient Instructions (Addendum)
Thank you for visiting clinic today. I am sorry that you are hurting,I am giving you a different antibiotic called ciprofloxacin for [redacted] weeks along with ear drops which you will use 4 times a day. Please F/U if your symptoms failed to resolved within next few days or get worse. A1c is 7.7 today-continue your current treatment and keep watching diet with daily exercise. F/U 3 months

## 2018-04-16 NOTE — Assessment & Plan Note (Signed)
Her exam was more consistent with otitis externa.  She was asked to stop taking amoxicillin. She was given a prescription of ciprofloxacin for [redacted] weeks along with Cortisporin eardrops. Return precautions were discussed-she was advised to call the clinic if her symptoms get worse or fail to resolve the next few days. She was also advised to avoid any swimming.

## 2018-04-16 NOTE — Progress Notes (Signed)
   CC: Bilateral ear pain, worse on right for 1 week.  HPI:  Whitney Wagner is a 42 y.o. with past medical history as listed below came to the clinic with complaint of bilateral ear pain along with subjective fever, sinus congestion and mild sore throat for 1 week.  Pain started in her right ear approximately a week ago, initially she was using peroxide and over-the-counter eardrops with no relief, was seen in the ED on  Wednesday and was diagnosed with otitis media and was given a prescription of ibuprofen 800 mg and amoxicillin 875 mg twice daily.  According to patient her symptoms continue to get worse and not she developed pain in her left ear.  She was also complaining of seeing some discharge on her pillow this morning from the right ear.  Her pain is radiating to right side of her and neck.  She denies any change in her vision.  She was also having some sinus congestion with rhinorrhea and mild sore throat which she attributes to her allergies.  She never had any ear infection before.  Denies any sick contact. Denies any swimming. According to patient she is compliant with her medication for diabetes, she recently lost her mother towards the end of February 2019, and for few weeks she was not taking her medications regularly or watching her diet, for the past 2 to 3 weeks she restarted her medications regularly and taking care of herself again.  Past Medical History:  Diagnosis Date  . Arthritis    "beginnings in my hands" (05/26/2013)  . Diabetes mellitus without complication ([place]) 1610  . GERD (gastroesophageal reflux disease)    otc meds prn  . Migraines, neuralgic    '~ 3/week" (05/26/2013) - otc meds prn  . Morbid obesity ([place])   . Osgood-Schlatter's disease of left knee    left knee  . PCOS (polycystic ovarian syndrome)   . Seasonal allergies   . Tooth pain 05/26/2013   "bottom right" (05/26/2013)  . Uterine fibroid    Review of Systems: Negative except mentioned in  HPI.  Physical Exam:  Vitals:   04/16/18 1438  BP: (!) 149/97  Pulse: 93  Temp: 98.1 F (36.7 C)  TempSrc: Oral  SpO2: 98%  Weight: 285 lb 12.8 oz (129.6 kg)  Height: 5\' 2"  (1.575 m)    General: Vital signs reviewed.  Patient is well-developed and obese, in no acute distress and cooperative with exam.  HEENT: Normocephalic and atraumatic. Right ear.  Tenderness on pulling tragus, marked edema and erythema of ear canal, unable to visualize tympanic membrane. Left ear.  Erythema of ear canal with bulging tympanic membrane. Mild erythema and edema of bilateral nasal turbinate. No pharyngeal edema or erythema, no exudate noted. Cardiovascular: RRR, S1 normal, S2 normal, no murmurs, gallops, or rubs. Pulmonary/Chest: Clear to auscultation bilaterally, no wheezes, rales, or rhonchi. Abdominal: Soft, non-tender, non-distended, BS +, no masses, organomegaly, or guarding present.  Extremities: No lower extremity edema bilaterally,  pulses symmetric and intact bilaterally. No cyanosis or clubbing. Skin: Warm, dry and intact. No rashes or erythema. Psychiatric: Normal mood and affect. speech and behavior is normal. Cognition and memory are normal.  Assessment & Plan:   See Encounters Tab for problem based charting.  Patient seen with Dr. Eppie Gibson.

## 2018-04-19 ENCOUNTER — Telehealth: Payer: Self-pay | Admitting: Internal Medicine

## 2018-04-19 NOTE — Telephone Encounter (Signed)
appt 5/1 [facility]

## 2018-04-19 NOTE — Telephone Encounter (Signed)
  Reason for call:   I placed an outgoing call to [name] at 1:40 PM  regarding her ear pain.According to her she continue to have bad earache and intermittent fevers. She did not noticed much difference in her condition since the start of antibiotics on Friday.   Assessment/ Plan:   Patient had possible otitis externa, was started on Cipro and cortisporin on Friday.  Advised her to come to [facility] for reevaluation, she will try coming tomorrow as busy today.  As always, pt is advised that if symptoms worsen or new symptoms arise, they should go to an urgent care facility or to to ER for further evaluation.   Lorella Nimrod, MD   04/19/2018, 1:41 PM

## 2018-04-19 NOTE — Progress Notes (Signed)
I saw and evaluated the patient. I personally confirmed the key portions of Dr. Latina Craver history and exam and reviewed pertinent patient test results. The assessment, diagnosis, and plan were formulated together and I agree with the documentation in the resident's note.  My only correction is that upon my examination the right external ear canal was edematous and erythematous.  I appreciated no abnormalities in the tympanic membrane to suggest a concomitant otitis media.

## 2018-04-19 NOTE — Telephone Encounter (Signed)
Called the patient and advised to come to [facility] tomorrow if still not feeling well.

## 2018-04-19 NOTE — Telephone Encounter (Signed)
Patient is calling to give Dr Reesa Chew a checkup on her status, she is still doing about the same. Pls call patient

## 2018-04-21 ENCOUNTER — Ambulatory Visit: Payer: Self-pay

## 2018-04-29 ENCOUNTER — Other Ambulatory Visit: Payer: Self-pay | Admitting: Internal Medicine

## 2018-04-29 ENCOUNTER — Other Ambulatory Visit: Payer: Self-pay | Admitting: *Deleted

## 2018-04-29 DIAGNOSIS — E118 Type 2 diabetes mellitus with unspecified complications: Secondary | ICD-10-CM

## 2018-04-29 DIAGNOSIS — Z794 Long term (current) use of insulin: Principal | ICD-10-CM

## 2018-04-29 DIAGNOSIS — E1169 Type 2 diabetes mellitus with other specified complication: Secondary | ICD-10-CM

## 2018-04-29 MED ORDER — INSULIN DETEMIR 100 UNIT/ML ~~LOC~~ SOLN: 30.0000 [IU] | Freq: Every day | SUBCUTANEOUS | 11 refills | Status: DC

## 2018-04-29 MED FILL — MELOXICAM 15 MG TABLET: 15 | 30 days supply | Qty: 30 | Fill #1

## 2018-04-29 MED FILL — LEVEMIR 100 UNITS/ML VIAL: 100 | 30 days supply | Qty: 10 | Fill #0

## 2018-04-29 NOTE — Telephone Encounter (Signed)
Received refill request from [facility] for levemir 0.35ml (20 units total) into the skin daily at bedtime.  Pt charts reflects levemir 0.3 (30units total) into skin at bedtime.  I contacted patient to confirm dose and she states that she was increased to 32 units at bedtime and has been taking this dose for about 6mths.  Hasn't had any levemir since Fri 5/3.  Will send info to pcp for review and new rx if appropriate.  Of note, rx needs to include IM Program.Cher Franzoni Cassady5/9/20192:54 PM

## 2018-04-30 ENCOUNTER — Encounter: Payer: Self-pay | Admitting: Internal Medicine

## 2018-05-03 MED FILL — JANUMET 50-1,000 MG TABLET: 50-1000 | 30 days supply | Qty: 60 | Fill #0

## 2018-05-13 ENCOUNTER — Other Ambulatory Visit: Payer: Self-pay | Admitting: Internal Medicine

## 2018-05-16 ENCOUNTER — Encounter: Payer: Self-pay | Admitting: *Deleted

## 2018-05-20 ENCOUNTER — Other Ambulatory Visit: Payer: Self-pay | Admitting: Internal Medicine

## 2018-05-20 DIAGNOSIS — M542 Cervicalgia: Secondary | ICD-10-CM

## 2018-05-31 MED FILL — FLUTICASONE PROP 50 MCG SPR: 50 | 60 days supply | Qty: 16 | Fill #1

## 2018-05-31 MED FILL — CYCLOBENZAPRINE 5 MG TABLET: 5 | 10 days supply | Qty: 30 | Fill #0

## 2018-06-04 ENCOUNTER — Ambulatory Visit: Payer: Self-pay | Admitting: Internal Medicine

## 2018-06-04 DIAGNOSIS — L739 Follicular disorder, unspecified: Secondary | ICD-10-CM | POA: Insufficient documentation

## 2018-06-04 MED ORDER — SULFAMETHOXAZOLE-TRIMETHOPRIM 800-160 MG PO TABS: 1.0000 | ORAL_TABLET | Freq: Two times a day (BID) | ORAL | 0 refills | Status: DC

## 2018-06-04 MED ORDER — TRAMADOL HCL 50 MG PO TABS: 50.0000 mg | ORAL_TABLET | Freq: Three times a day (TID) | ORAL | 0 refills | Status: AC

## 2018-06-04 MED FILL — LEVEMIR 100 UNITS/ML VIAL: 100 | 30 days supply | Qty: 10 | Fill #1

## 2018-06-04 MED FILL — MELOXICAM 15 MG TABLET: 15 | 30 days supply | Qty: 30 | Fill #2

## 2018-06-04 MED FILL — traMADol HCL 50 MG TABS: 50 | 3 days supply | Qty: 9 | Fill #0

## 2018-06-04 MED FILL — JANUMET 50-1,000 MG TABLET: 50-1000 | 30 days supply | Qty: 60 | Fill #1

## 2018-06-04 MED FILL — SULFAMETHOXAZOLE-TMP DS TAB: 800-160 | 7 days supply | Qty: 14 | Fill #0

## 2018-06-04 NOTE — Patient Instructions (Addendum)
Whitney Wagner,   It was nice meeting you today.   You had an incision and drainage performed today. If your symptoms worsen or fail to improve on the antibiotics please return to clinic.   I have prescribed you 7 days of Bactrim to take twice daily. Please complete this course.

## 2018-06-04 NOTE — Assessment & Plan Note (Addendum)
Patient is presenting with 3-4 of days of progressively worsening leg abscess. She states that she has a history of MRSA and folliculitis and she typically does not shave her legs for that reason. She had gotten bitten by a mosquito several weeks ago and needed to shave that area of her leg for bandage placement. After shaving, she developed several small folliculitis like lesions. One of these lesions has gotten larger, more red, and tender over the past several days. There has been no active drainage from the lesion. The patient denies fevers, chills, nausea, or vomiting.   On exam, the patient's right medial calf she has multiple, small papular lesions surrounding hair follicles.  The area of concern is a 2.5 cm erythematous lesion with a white pustular center, with a surrounding area of erythema to 3.5 cm with underlying induration.  No fluctuation. The lesion is tender to palpation.   Point-of-care ultrasound did not demonstrate a large pocket of fluid to be drained, but did demonstrate inflamed subcutaneous tissue.   Patient's presentation consistent with folliculitis and follicular abscess.   After obtaining consent, the patient's abscess was cleaned with Povidone-iodine. No lidocaine was used, and the area was topically anesthestized. A scalpel was used to make a small incision in the pustular center to allow for continuous drainage. 1 cc of pus was drained from the lesion. The lesion was dressed with 4x4 gauze and surgical tape.     Plan: -Bactrim BID for 7 days -Tramadol 50 mg q8 hours PRN for pain x 3 days duration  -RTC if symptoms worsen or fail to imrpove

## 2018-06-04 NOTE — Progress Notes (Signed)
   CC: leg abscess   HPI:  Whitney Wagner is a 42 y.o. female with a past medical history listed below here today for concern about a leg abscess.   For details of today's visit and the status of her acute and chronic medical issues please refer to the assessment and plan.  Past Medical History:  Diagnosis Date  . Arthritis    "beginnings in my hands" (05/26/2013)  . Diabetes mellitus without complication ([place]) 8676  . GERD (gastroesophageal reflux disease)    otc meds prn  . Migraines, neuralgic    '~ 3/week" (05/26/2013) - otc meds prn  . Morbid obesity ([place])   . Osgood-Schlatter's disease of left knee    left knee  . PCOS (polycystic ovarian syndrome)   . Seasonal allergies   . Tooth pain 05/26/2013   "bottom right" (05/26/2013)  . Uterine fibroid    Review of Systems:   Review of Systems  Constitutional: Negative for chills, diaphoresis, fever and malaise/fatigue.  Respiratory: Negative for shortness of breath.   Cardiovascular: Negative for chest pain.  Gastrointestinal: Negative for nausea and vomiting.    Physical Exam:  Vitals:   06/04/18 0938  BP: 105/69  Pulse: 92  Temp: 97.6 F (36.4 C)  TempSrc: Oral  SpO2: 98%  Weight: 221 lb 14.4 oz (100.7 kg)  Height: 5\' 2"  (1.575 m)   Physical Exam  Constitutional: She is oriented to person, place, and time. She appears well-developed and well-nourished. No distress.  HENT:  Head: Normocephalic and atraumatic.  Eyes: Conjunctivae are normal. No scleral icterus.  Cardiovascular: Normal rate, regular rhythm and normal heart sounds.  Pulmonary/Chest: Effort normal and breath sounds normal.  Neurological: She is alert and oriented to person, place, and time. She has normal strength. No cranial nerve deficit or sensory deficit.  Skin:     Several small erythematous papular areas surrounding hair follicles     Assessment & Plan:   See Encounters Tab for problem based charting.  Patient seen with Dr. Daryll Drown

## 2018-06-07 NOTE — Progress Notes (Signed)
Internal Medicine Clinic Attending  I saw and evaluated the patient.  I personally confirmed the key portions of the history and exam documented by Dr. LaCroce and I reviewed pertinent patient test results.  The assessment, diagnosis, and plan were formulated together and I agree with the documentation in the resident's note.  

## 2018-06-21 ENCOUNTER — Other Ambulatory Visit: Payer: Self-pay

## 2018-06-21 ENCOUNTER — Encounter: Payer: Self-pay | Admitting: Internal Medicine

## 2018-06-21 ENCOUNTER — Ambulatory Visit: Payer: Self-pay | Admitting: Internal Medicine

## 2018-06-21 VITALS — BP 121/82 | HR 100 | Temp 99.3°F | Ht 62.0 in | Wt 283.9 lb

## 2018-06-21 DIAGNOSIS — Z794 Long term (current) use of insulin: Secondary | ICD-10-CM

## 2018-06-21 DIAGNOSIS — M542 Cervicalgia: Secondary | ICD-10-CM

## 2018-06-21 DIAGNOSIS — Z79899 Other long term (current) drug therapy: Secondary | ICD-10-CM

## 2018-06-21 DIAGNOSIS — I10 Essential (primary) hypertension: Secondary | ICD-10-CM

## 2018-06-21 DIAGNOSIS — M7701 Medial epicondylitis, right elbow: Secondary | ICD-10-CM

## 2018-06-21 DIAGNOSIS — G8929 Other chronic pain: Secondary | ICD-10-CM

## 2018-06-21 DIAGNOSIS — E1169 Type 2 diabetes mellitus with other specified complication: Secondary | ICD-10-CM

## 2018-06-21 DIAGNOSIS — Z791 Long term (current) use of non-steroidal anti-inflammatories (NSAID): Secondary | ICD-10-CM

## 2018-06-21 DIAGNOSIS — Z9111 Patient's noncompliance with dietary regimen: Secondary | ICD-10-CM

## 2018-06-21 LAB — POCT GLYCOSYLATED HEMOGLOBIN (HGB A1C): HEMOGLOBIN A1C: 8.7 % — AB (ref 4.0–5.6)

## 2018-06-21 LAB — GLUCOSE, CAPILLARY: Glucose-Capillary: 295 mg/dL — ABNORMAL HIGH (ref 70–99)

## 2018-06-21 MED ORDER — TENNIS ELBOW NEOPRENE BRACE MISC: 1.0000 [IU] | Freq: Once | 0 refills | Status: AC

## 2018-06-21 MED ORDER — DULAGLUTIDE 1.5 MG/0.5ML ~~LOC~~ SOAJ: 1.5000 mg | SUBCUTANEOUS | 6 refills | Status: DC

## 2018-06-21 MED ORDER — MELOXICAM 15 MG PO TABS: ORAL_TABLET | ORAL | 2 refills | Status: DC

## 2018-06-21 NOTE — Progress Notes (Signed)
   CC: Right elbow pain and for follow-up of her diabetes and hypertension.  HPI:  [name] is a 42 y.o. with past medical history as listed below came to the clinic for follow-up of her diabetes and hypertension.  She was complaining of worsening of her right elbow pain.  Denies any erythema or edema.  Denies any tingling or numbness.  Denies any focal deficit.  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  Please see assessment and plan for her chronic conditions.  Past Medical History:  Diagnosis Date  . Arthritis    "beginnings in my hands" (05/26/2013)  . Diabetes mellitus without complication ([place]) 9147  . GERD (gastroesophageal reflux disease)    otc meds prn  . Migraines, neuralgic    '~ 3/week" (05/26/2013) - otc meds prn  . Morbid obesity ([place])   . Osgood-Schlatter's disease of left knee    left knee  . PCOS (polycystic ovarian syndrome)   . Seasonal allergies   . Tooth pain 05/26/2013   "bottom right" (05/26/2013)  . Uterine fibroid    Review of Systems: Negative except mentioned in HPI.  Physical Exam:  Vitals:   06/21/18 1440  BP: 121/82  Pulse: 100  Temp: 99.3 F (37.4 C)  TempSrc: Oral  SpO2: 98%  Weight: 283 lb 14.4 oz (128.8 kg)  Height: 5\' 2"  (1.575 m)    General: Vital signs reviewed.  Patient is well-developed and obese, in no acute distress and cooperative with exam.  Head: Normocephalic and atraumatic. Eyes: EOMI, conjunctivae normal, no scleral icterus.  Neck: Supple, trachea midline, normal ROM, no JVD, masses, thyromegaly, or carotid bruit present.  Cardiovascular: RRR, S1 normal, S2 normal, no murmurs, gallops, or rubs. Pulmonary/Chest: Clear to auscultation bilaterally, no wheezes, rales, or rhonchi. Abdominal: Soft, non-tender, non-distended, BS +, no masses, organomegaly, or guarding present.  Musculoskeletal: Mild tenderness along medial condyle, no erythema or  edema, restricted extension at right elbow due to pain, no focal deficit. Extremities: No lower extremity edema bilaterally,  pulses symmetric and intact bilaterally. No cyanosis or clubbing. Skin: Warm, dry and intact. No rashes or erythema. Psychiatric: Normal mood and affect. speech and behavior is normal. Cognition and memory are normal.  Assessment & Plan:   See Encounters Tab for problem based charting.  Patient discussed with Dr. Daryll Drown.

## 2018-06-21 NOTE — Patient Instructions (Addendum)
Thank you for visiting clinic today. I am sending a prescription of Trulicity to [facility] health department, please fill out the paper requirement so you can get it for free. Let me know when to start getting Trulicity, we will discontinue Janumet and start you on Metformin at that point. Continue taking 30 units of Levemir. Please follow your diabetic diet. For your elbow you can use a elbow brace and take Mobic/meloxicam every day for the next few days and see if that will help. If your symptoms did not resolve or continue to get worse please make an appointment for a steroid injection in your elbow. If your symptoms improve you can see [place] back in 40-month.

## 2018-06-24 NOTE — Assessment & Plan Note (Signed)
There was an increase in her A1c from 7.7-8.7 today. She does not check her blood sugar regularly, did bring her glucometer with very few readings average of about 259. She is noncompliant with her diet and occasionally forget to take her nighttime meds and Lantus due to falling asleep early. This comes up with some social issues around her causing her eat more carbohydrates.  She eats a lot of cookies and cakes.  She was offered again a visit with Butch Penny which she declined, stating that she knows what she is doing wrong and will fix it at her on will.  We will discontinue Victoza. Give her a prescription for Trulicity-which she can fill at [facility] with patient assistance program. Continue Lantus 30 units at bedtime. Continue Janumet 50-1000 at this time, once she is established on Trulicity we can start her on metformin and discontinue Janumet.

## 2018-06-24 NOTE — Assessment & Plan Note (Signed)
She is having another exacerbation of her chronic right elbow pain. According to patient she does use golfers elbow brace with no relief. Currently taking Mobic daily.  She can add Tylenol during the day if needed for further management of pain. Try conservative measures initially. She was advised to make an appointment next week for an steroid injection if her symptoms persist.

## 2018-06-24 NOTE — Assessment & Plan Note (Signed)
She continued to experience musculoskeletal, intermittent neck pain. Flexeril and Mobic to help with that pain.  She can continue taking Mobic and Flexeril if needed.

## 2018-06-28 ENCOUNTER — Ambulatory Visit (INDEPENDENT_AMBULATORY_CARE_PROVIDER_SITE_OTHER): Payer: Self-pay | Admitting: Internal Medicine

## 2018-06-28 ENCOUNTER — Encounter: Payer: Self-pay | Admitting: Internal Medicine

## 2018-06-28 ENCOUNTER — Other Ambulatory Visit: Payer: Self-pay

## 2018-06-28 VITALS — BP 137/80 | HR 118 | Temp 98.1°F | Ht 62.0 in | Wt 279.7 lb

## 2018-06-28 DIAGNOSIS — M7701 Medial epicondylitis, right elbow: Secondary | ICD-10-CM

## 2018-06-28 DIAGNOSIS — E1169 Type 2 diabetes mellitus with other specified complication: Secondary | ICD-10-CM

## 2018-06-28 LAB — GLUCOSE, CAPILLARY: GLUCOSE-CAPILLARY: 221 mg/dL — AB (ref 70–99)

## 2018-06-28 NOTE — Progress Notes (Signed)
Internal Medicine Clinic Attending  Case discussed with Dr. Amin at the time of the visit.  We reviewed the resident's history and exam and pertinent patient test results.  I agree with the assessment, diagnosis, and plan of care documented in the resident's note.    

## 2018-06-28 NOTE — Assessment & Plan Note (Signed)
Patient continue to experience right elbow pain along medial epicondyles region.  Pain continued to interfere with her work. We have tried elbow brace, NSAID, Voltaren gel, lidocaine patch with minimum relief.  She came here to get a steroid injection at medial epicondyle.  Elbow injection Procedure Note  Pre-operative Diagnosis: right medial epicondylitis.  Indications: Symptomatic relief of medial epicondylitis.  Procedure Details    Consent was obtained for the procedure.  Point tenderness at medial condyle was located by palpation.  The injection site at the elbow was prepped with iodine. Using a 22 gauge needle the medial epicondyles was injected with 2 mL 1% lidocaine and 1 mL of betamethasone (CELESTONE). The needle was removed and a dressing was applied.  Complications:  None; patient tolerated the procedure well.

## 2018-06-28 NOTE — Patient Instructions (Signed)
  Thank you for visiting clinic today. We did a steroid injection for your right elbow, we hope that it will help with your pain. Please call the clinic if your symptoms continue to get worse.   Epidural Steroid Injection, Care After Refer to this sheet in the next few weeks. These instructions provide you with information about caring for yourself after your procedure. Your health care provider may also give you more specific instructions. Your treatment has been planned according to current medical practices, but problems sometimes occur. Call your health care provider if you have any problems or questions after your procedure. What can I expect after the procedure? After your procedure, it is common to feel a little discomfort at the injection site. Follow these instructions at home:  For 24 hours after the procedure: ? Avoid using heat on the injection site. ? Do not take a tub bath, and do not soak in water. ? Do not drive if you received a medicine to help you relax (sedative).  If directed, put ice on the injection site: ? Put ice in a plastic bag. ? Place a towel between your skin and the bag. ? Leave the ice on for 20 minutes, 2-3 times a day.  Return to your normal activities as told by your health care provider. Ask your health care provider what activities are safe for you.  You may remove the bandage (dressing) after 24 hours.  Take over-the-counter and prescription medicines only as told by your health care provider.  Keep all follow-up visits as told by your health care provider. This is important. Contact a health care provider if:  You have a fever.  You continue to have pain and soreness around the injection site, even after taking over-the-counter pain medicine.  You have severe, sudden, or lasting nausea or vomiting. Get help right away if:  You have severe pain at the injection site that is not relieved by medicines.  You develop a severe headache or a  stiff neck.  You become sensitive to light.  You have any new numbness or weakness in your legs or arms.  You lose control of your bladder or bowel movements.  You have trouble breathing. This information is not intended to replace advice given to you by your health care provider. Make sure you discuss any questions you have with your health care provider. Document Released: 03/25/2011 Document Revised: 05/15/2016 Document Reviewed: 03/25/2016 Elsevier Interactive Patient Education  [employer].

## 2018-06-28 NOTE — Progress Notes (Signed)
   CC: For right medial epicondyles injection.  HPI:  [name] is a 42 y.o. with past medical history as listed below came to the clinic to get a steroid injection at right medial epicondyle.  Patient continued to experience right elbow pain, with tenderness around medial epicondyles for about a year and failed all other conservative measures.  Past Medical History:  Diagnosis Date  . Arthritis    "beginnings in my hands" (05/26/2013)  . Diabetes mellitus without complication ([place]) 7262  . GERD (gastroesophageal reflux disease)    otc meds prn  . Migraines, neuralgic    '~ 3/week" (05/26/2013) - otc meds prn  . Morbid obesity ([place])   . Osgood-Schlatter's disease of left knee    left knee  . PCOS (polycystic ovarian syndrome)   . Seasonal allergies   . Tooth pain 05/26/2013   "bottom right" (05/26/2013)  . Uterine fibroid    Review of Systems: Negative except mentioned in HPI.  Physical Exam:  Vitals:   06/28/18 1509  BP: 137/80  Pulse: (!) 118  Temp: 98.1 F (36.7 C)  TempSrc: Oral  SpO2: 98%  Weight: 279 lb 11.2 oz (126.9 kg)  Height: 5\' 2"  (1.575 m)   Vitals:   06/28/18 1509  BP: 137/80  Pulse: (!) 118  Temp: 98.1 F (36.7 C)  TempSrc: Oral  SpO2: 98%  Weight: 279 lb 11.2 oz (126.9 kg)  Height: 5\' 2"  (1.575 m)   General: Vital signs reviewed.  Patient is well-developed and well-nourished, in no acute distress and cooperative with exam.  Head: Normocephalic and atraumatic. Eyes: EOMI, conjunctivae normal, no scleral icterus.  Cardiovascular: RRR, S1 normal, S2 normal, no murmurs, gallops, or rubs. Pulmonary/Chest: Clear to auscultation bilaterally, no wheezes, rales, or rhonchi. Abdominal: Soft, non-tender, non-distended, BS +, no masses, organomegaly, or guarding present.  Musculoskeletal: Tenderness along right medial condyle. Extremities: No lower extremity edema bilaterally,  pulses symmetric and intact bilaterally. No cyanosis or clubbing. Skin:  Warm, dry and intact. No rashes or erythema. Psychiatric: Normal mood and affect. speech and behavior is normal. Cognition and memory are normal.  Assessment & Plan:   See Encounters Tab for problem based charting.  Patient seen with Dr. Evette Doffing.

## 2018-06-29 NOTE — Progress Notes (Signed)
Internal Medicine Clinic Attending  I saw and evaluated the patient.  I personally confirmed the key portions of the history and exam documented by Dr. Amin and I reviewed pertinent patient test results.  The assessment, diagnosis, and plan were formulated together and I agree with the documentation in the resident's note.  I was present for the entirety of the procedure.  

## 2018-07-05 ENCOUNTER — Other Ambulatory Visit: Payer: Self-pay | Admitting: Internal Medicine

## 2018-07-05 DIAGNOSIS — M542 Cervicalgia: Secondary | ICD-10-CM

## 2018-07-05 MED FILL — MELOXICAM 15 MG TABLET: 15 | 30 days supply | Qty: 30 | Fill #0

## 2018-07-05 MED FILL — JANUMET 50-1,000 MG TABLET: 50-1000 | 30 days supply | Qty: 60 | Fill #2

## 2018-07-05 MED FILL — LEVEMIR 100 UNITS/ML VIAL: 100 | 30 days supply | Qty: 10 | Fill #2

## 2018-07-06 ENCOUNTER — Ambulatory Visit: Payer: Self-pay

## 2018-07-06 MED FILL — CYCLOBENZAPRINE 5 MG TABLET: 5 | 10 days supply | Qty: 30 | Fill #0

## 2018-07-16 ENCOUNTER — Encounter: Payer: Self-pay | Admitting: Internal Medicine

## 2018-08-13 ENCOUNTER — Other Ambulatory Visit: Payer: Self-pay | Admitting: Internal Medicine

## 2018-08-13 DIAGNOSIS — Z794 Long term (current) use of insulin: Principal | ICD-10-CM

## 2018-08-13 DIAGNOSIS — M542 Cervicalgia: Secondary | ICD-10-CM

## 2018-08-13 DIAGNOSIS — E118 Type 2 diabetes mellitus with unspecified complications: Secondary | ICD-10-CM

## 2018-08-13 MED ORDER — SITAGLIPTIN PHOS-METFORMIN HCL 50-1000 MG PO TABS: ORAL_TABLET | ORAL | 2 refills | Status: DC

## 2018-08-13 MED FILL — CYCLOBENZAPRINE 5 MG TABLET: 5 | 10 days supply | Qty: 30 | Fill #0

## 2018-08-13 MED FILL — MELOXICAM 15 MG TABLET: 15 | 30 days supply | Qty: 30 | Fill #1

## 2018-08-13 MED FILL — JANUMET 50-1,000 MG TABLET: 50-1000 | 30 days supply | Qty: 60 | Fill #0

## 2018-08-13 MED FILL — LEVEMIR 100 UNITS/ML VIAL: 100 | 30 days supply | Qty: 10 | Fill #3

## 2018-08-13 NOTE — Telephone Encounter (Signed)
Next appt scheduled 10/7 with PCP.

## 2018-08-13 NOTE — Addendum Note (Signed)
Addended by: Forde Dandy on: 08/13/2018 05:09 PM   Modules accepted: Orders

## 2018-08-17 ENCOUNTER — Encounter: Payer: Self-pay | Admitting: *Deleted

## 2018-08-26 ENCOUNTER — Ambulatory Visit: Payer: Self-pay

## 2018-09-16 ENCOUNTER — Other Ambulatory Visit: Payer: Self-pay | Admitting: Internal Medicine

## 2018-09-16 DIAGNOSIS — M542 Cervicalgia: Secondary | ICD-10-CM

## 2018-09-16 MED FILL — JANUMET 50-1,000 MG TABLET: 50-1000 | 30 days supply | Qty: 60 | Fill #1

## 2018-09-16 MED FILL — LEVEMIR 100 UNITS/ML VIAL: 100 | 30 days supply | Qty: 10 | Fill #4

## 2018-09-16 MED FILL — MELOXICAM 15 MG TABLET: 15 | 30 days supply | Qty: 30 | Fill #2

## 2018-09-16 MED FILL — FLUTICASONE PROP 50 MCG SPR: 50 | 60 days supply | Qty: 16 | Fill #2

## 2018-09-16 MED FILL — CYCLOBENZAPRINE 5 MG TABLET: 5 | 10 days supply | Qty: 30 | Fill #0

## 2018-09-16 NOTE — Telephone Encounter (Signed)
Next appt scheduled 11/18 with PCP. 

## 2018-09-27 ENCOUNTER — Encounter: Payer: Self-pay | Admitting: Internal Medicine

## 2018-10-18 ENCOUNTER — Other Ambulatory Visit: Payer: Self-pay | Admitting: Internal Medicine

## 2018-10-18 DIAGNOSIS — M542 Cervicalgia: Secondary | ICD-10-CM

## 2018-10-18 MED FILL — JANUMET 50-1,000 MG TABLET: 50-1000 | 30 days supply | Qty: 60 | Fill #2

## 2018-10-18 MED FILL — LEVEMIR 100 UNITS/ML VIAL: 100 | 30 days supply | Qty: 10 | Fill #5

## 2018-11-08 ENCOUNTER — Encounter: Payer: Self-pay | Admitting: Internal Medicine

## 2018-11-17 MED FILL — MELOXICAM 15 MG TABLET: 15 | 30 days supply | Qty: 30 | Fill #0

## 2018-11-17 MED FILL — CYCLOBENZAPRINE 5 MG TABLET: 5 | 10 days supply | Qty: 30 | Fill #0

## 2018-11-17 MED FILL — JANUMET 50-1,000 MG TABLET: 50-1000 | 30 days supply | Qty: 60 | Fill #0

## 2018-11-17 MED FILL — LEVEMIR 100 UNITS/ML VIAL: 100 | 30 days supply | Qty: 10 | Fill #6

## 2018-12-24 MED FILL — LEVEMIR 100 UNITS/ML VIAL: 100 | 30 days supply | Qty: 10 | Fill #7

## 2018-12-24 MED FILL — JANUMET 50-1,000 MG TABLET: 50-1000 | 30 days supply | Qty: 60 | Fill #1

## 2019-01-28 MED FILL — JANUMET 50-1,000 MG TABLET: 50-1000 | 30 days supply | Qty: 60 | Fill #2

## 2019-01-28 MED FILL — LEVEMIR 100 UNITS/ML VIAL: 100 | 30 days supply | Qty: 10 | Fill #8

## 2019-02-15 ENCOUNTER — Ambulatory Visit: Payer: Self-pay | Admitting: Internal Medicine

## 2019-02-15 ENCOUNTER — Encounter: Payer: Self-pay | Admitting: Internal Medicine

## 2019-02-15 ENCOUNTER — Other Ambulatory Visit: Payer: Self-pay

## 2019-02-15 VITALS — BP 120/74 | HR 95 | Temp 98.4°F | Ht 62.0 in | Wt 278.7 lb

## 2019-02-15 DIAGNOSIS — Z8719 Personal history of other diseases of the digestive system: Secondary | ICD-10-CM

## 2019-02-15 DIAGNOSIS — E1140 Type 2 diabetes mellitus with diabetic neuropathy, unspecified: Secondary | ICD-10-CM

## 2019-02-15 DIAGNOSIS — G629 Polyneuropathy, unspecified: Secondary | ICD-10-CM

## 2019-02-15 DIAGNOSIS — Z794 Long term (current) use of insulin: Secondary | ICD-10-CM

## 2019-02-15 DIAGNOSIS — Z79899 Other long term (current) drug therapy: Secondary | ICD-10-CM

## 2019-02-15 DIAGNOSIS — E1169 Type 2 diabetes mellitus with other specified complication: Secondary | ICD-10-CM

## 2019-02-15 LAB — GLUCOSE, CAPILLARY: Glucose-Capillary: 142 mg/dL — ABNORMAL HIGH (ref 70–99)

## 2019-02-15 LAB — POCT GLYCOSYLATED HEMOGLOBIN (HGB A1C): HEMOGLOBIN A1C: 8.2 % — AB (ref 4.0–5.6)

## 2019-02-15 MED ORDER — PREGABALIN 100 MG PO CAPS: 100.0000 mg | ORAL_CAPSULE | Freq: Three times a day (TID) | ORAL | 3 refills | Status: DC

## 2019-02-15 MED ORDER — ATORVASTATIN CALCIUM 20 MG PO TABS: 20.0000 mg | ORAL_TABLET | Freq: Every day | ORAL | 3 refills | Status: DC

## 2019-02-15 NOTE — Assessment & Plan Note (Signed)
Symptoms are controlled with Lyrica 100 mg 3 times a day.

## 2019-02-15 NOTE — Assessment & Plan Note (Signed)
Her A1c today was 8.2, some improvement from her previous check of 8.7. She does not check her blood sugar at home. Having some family issues and not following her diabetic diet. She was unable to tolerate Victoza in the past, given a prescription of Trulicity which was sent to [facility] during her previous clinic visit, she has not picked it up yet stating that she was very busy with some family issues.  -She will continue Janumet and Lantus 30 units at bedtime. -Advised her to pick up Trulicity from [facility] and let us know because at that point we will discontinue Janumet and start her on metformin. -Advised her to follow her diabetic diet and exercise regularly to lose some weight. -She still does not want to see Butch Penny. -Foot exam was done today. -She was advised to make an appointment for her eye exam. -We will check microalbumin urea today.

## 2019-02-15 NOTE — Progress Notes (Signed)
   CC: For follow-up of her diabetes.  HPI:  Whitney Wagner is a 43 y.o. with past medical history as listed below came to the clinic for follow-up of her diabetes.  Recently seen in ED on January 23 due to abdominal pain, nausea and vomiting and was treated for gastroenteritis.  Her symptoms has been completely resolved.  Has no new complaints today.  Please see assessment and plan for her chronic conditions.  Past Medical History:  Diagnosis Date  . Arthritis    "beginnings in my hands" (05/26/2013)  . Diabetes mellitus without complication ([place]) 7262  . GERD (gastroesophageal reflux disease)    otc meds prn  . Migraines, neuralgic    '~ 3/week" (05/26/2013) - otc meds prn  . Morbid obesity ([place])   . Osgood-Schlatter's disease of left knee    left knee  . PCOS (polycystic ovarian syndrome)   . Seasonal allergies   . Tooth pain 05/26/2013   "bottom right" (05/26/2013)  . Uterine fibroid    Review of Systems: Negative except mentioned in HPI. Physical Exam:  Vitals:   02/15/19 1447  BP: 120/74  Pulse: 95  Temp: 98.4 F (36.9 C)  TempSrc: Oral  SpO2: 98%  Weight: 278 lb 11.2 oz (126.4 kg)  Height: 5\' 2"  (1.575 m)   General: Vital signs reviewed.  Patient is well-developed and well-nourished, in no acute distress and cooperative with exam.  Head: Normocephalic and atraumatic. Eyes: EOMI, conjunctivae normal, no scleral icterus.  Cardiovascular: RRR, S1 normal, S2 normal, no murmurs, gallops, or rubs. Pulmonary/Chest: Clear to auscultation bilaterally, no wheezes, rales, or rhonchi. Abdominal: Soft, non-tender, non-distended, BS +, no masses, organomegaly, or guarding present.  Extremities: No lower extremity edema bilaterally,  pulses symmetric and intact bilaterally. No cyanosis or clubbing. Neurological: A&O x3, Strength is normal and symmetric bilaterally, cranial nerve II-XII are grossly intact, no focal motor deficit, sensory intact to light touch bilaterally.    Skin: Warm, dry and intact. No rashes or erythema. Psychiatric: Normal mood and affect. speech and behavior is normal. Cognition and memory are normal.  Assessment & Plan:   See Encounters Tab for problem based charting.  Patient discussed with Dr. Evette Doffing.

## 2019-02-15 NOTE — Patient Instructions (Signed)
Thank you for visiting clinic today. Please let us know once you start Trulicity as we have to make changes to your Janumet after that. Try to follow diabetic diet and lose some weight as that will help with your diabetes. Please follow-up in 19-month.

## 2019-02-16 LAB — MICROALBUMIN / CREATININE URINE RATIO
CREATININE, UR: 182.4 mg/dL
Microalb/Creat Ratio: 10 mg/g creat (ref 0–29)
Microalbumin, Urine: 17.8 ug/mL

## 2019-02-16 NOTE — Progress Notes (Signed)
Internal Medicine Clinic Attending  Case discussed with Dr. Amin at the time of the visit.  We reviewed the resident's history and exam and pertinent patient test results.  I agree with the assessment, diagnosis, and plan of care documented in the resident's note.    

## 2019-03-02 ENCOUNTER — Other Ambulatory Visit: Payer: Self-pay | Admitting: Internal Medicine

## 2019-03-02 DIAGNOSIS — Z794 Long term (current) use of insulin: Principal | ICD-10-CM

## 2019-03-02 DIAGNOSIS — E118 Type 2 diabetes mellitus with unspecified complications: Secondary | ICD-10-CM

## 2019-03-02 MED FILL — LEVEMIR 100 UNITS/ML VIAL: 100 | 30 days supply | Qty: 10 | Fill #9 | Status: TO

## 2019-03-02 MED FILL — ATORVASTATIN 20 MG TABLET: 20 | 30 days supply | Qty: 30 | Fill #0 | Status: TO

## 2019-03-02 MED FILL — MELOXICAM 15 MG TABLET: 15 | 30 days supply | Qty: 30 | Fill #1 | Status: TO

## 2019-03-02 NOTE — Telephone Encounter (Signed)
Next appt scheduled 6/1/ with PCP. 

## 2019-03-07 MED FILL — JANUMET 50-1,000 MG TABLET: 50-1000 | 30 days supply | Qty: 60 | Fill #0 | Status: TO

## 2019-04-02 MED FILL — LEVEMIR 100 UNITS/ML VIAL: 100 | 30 days supply | Qty: 10 | Fill #0

## 2019-04-02 MED FILL — MELOXICAM 15 MG TABLET: 15 | 30 days supply | Qty: 30 | Fill #0

## 2019-04-02 MED FILL — ATORVASTATIN 20 MG TABLET: 20 | 30 days supply | Qty: 30 | Fill #0

## 2019-04-02 MED FILL — JANUMET 50-1,000 MG TABLET: 50-1000 | 30 days supply | Qty: 60 | Fill #0

## 2019-04-29 ENCOUNTER — Other Ambulatory Visit: Payer: Self-pay | Admitting: Internal Medicine

## 2019-04-29 DIAGNOSIS — M542 Cervicalgia: Secondary | ICD-10-CM

## 2019-04-29 MED FILL — JANUMET 50-1,000 MG TABLET: 50-1000 | 30 days supply | Qty: 60 | Fill #1 | Status: TO

## 2019-04-29 MED FILL — MELOXICAM 15 MG TABLET: 15 | 30 days supply | Qty: 30 | Fill #0 | Status: TO

## 2019-04-29 MED FILL — ATORVASTATIN 20 MG TABLET: 20 | 30 days supply | Qty: 30 | Fill #1 | Status: TO

## 2019-04-29 MED FILL — LEVEMIR 100 UNITS/ML VIAL: 100 | 30 days supply | Qty: 10 | Fill #1

## 2019-05-02 ENCOUNTER — Encounter: Payer: Self-pay | Admitting: Internal Medicine

## 2019-05-02 ENCOUNTER — Ambulatory Visit: Payer: Self-pay | Admitting: Internal Medicine

## 2019-05-02 ENCOUNTER — Other Ambulatory Visit: Payer: Self-pay

## 2019-05-02 ENCOUNTER — Other Ambulatory Visit (HOSPITAL_COMMUNITY)
Admission: RE | Admit: 2019-05-02 | Discharge: 2019-05-02 | Disposition: A | Discharge status: Home / Self Care | Payer: Self-pay | Source: Ambulatory Visit | Attending: Internal Medicine | Admitting: Internal Medicine

## 2019-05-02 VITALS — BP 115/54 | HR 93 | Temp 98.5°F | Ht 62.0 in | Wt 279.2 lb

## 2019-05-02 DIAGNOSIS — E1169 Type 2 diabetes mellitus with other specified complication: Secondary | ICD-10-CM

## 2019-05-02 DIAGNOSIS — N898 Other specified noninflammatory disorders of vagina: Secondary | ICD-10-CM | POA: Insufficient documentation

## 2019-05-02 DIAGNOSIS — Z794 Long term (current) use of insulin: Secondary | ICD-10-CM

## 2019-05-02 DIAGNOSIS — M7701 Medial epicondylitis, right elbow: Secondary | ICD-10-CM

## 2019-05-02 DIAGNOSIS — E118 Type 2 diabetes mellitus with unspecified complications: Secondary | ICD-10-CM

## 2019-05-02 MED ORDER — FLUCONAZOLE 150 MG PO TABS: 150.0000 mg | ORAL_TABLET | Freq: Every day | ORAL | 0 refills | Status: DC

## 2019-05-02 NOTE — Patient Instructions (Signed)
Thank you for visiting clinic today. I am increasing your Levemir from 30 to 35 units. Please keep checking your blood sugar 3-4 times daily and bring your glucometer with your next appointment in 1 month. I am also giving you Diflucan for your vaginal yeast infection, as we are sending it for culture if it grows another bacteria I will let you know. You can make an appointment for Friday afternoon for elbow injection. Please follow-up in 1 month with your glucometer.

## 2019-05-02 NOTE — Assessment & Plan Note (Signed)
She did bring her glucometer but we were unable to download it today.  Most of her blood sugars were above 300.  Lowest recorded was 178. Per patient as she is staying at home most of the time so she was eating a lot of cookies and Dr. Malachi Bonds.  -Had a long discussion regarding her eating habits.  We counseled her regarding low carbohydrate diet again today. -Increase Levemir from 30 to 35 units daily. -She was given a prescription of Trulicity which she has not picked up yet. -Follow-up in 1 month.

## 2019-05-02 NOTE — Progress Notes (Signed)
   CC: Pruritic vaginal discharge for 2 weeks.  HPI:  [name] is a 43 y.o. with past medical history as listed below came to the clinic with complaint of pruritic vaginal discharge for 2 weeks.  Please see assessment and plan for her chronic conditions.  Past Medical History:  Diagnosis Date  . Arthritis    "beginnings in my hands" (05/26/2013)  . Diabetes mellitus without complication ([place]) 5868  . GERD (gastroesophageal reflux disease)    otc meds prn  . Migraines, neuralgic    '~ 3/week" (05/26/2013) - otc meds prn  . Morbid obesity ([place])   . Osgood-Schlatter's disease of left knee    left knee  . PCOS (polycystic ovarian syndrome)   . Seasonal allergies   . Tooth pain 05/26/2013   "bottom right" (05/26/2013)  . Uterine fibroid    Review of Systems: Negative except mentioned in HPI.  Physical Exam:  Vitals:   05/02/19 1322 05/02/19 1324  BP:  (!) 115/54  Pulse:  93  Temp:  98.5 F (36.9 C)  TempSrc:  Oral  SpO2:  98%  Weight: 279 lb 3.2 oz (126.6 kg)   Height: 5\' 2"  (1.575 m) 5\' 2"  (1.575 m)   General: Vital signs reviewed.  Patient is well-developed and well-nourished, in no acute distress and cooperative with exam.  Head: Normocephalic and atraumatic. Eyes: EOMI, conjunctivae normal, no scleral icterus.  Cardiovascular: RRR, S1 normal, S2 normal, no murmurs, gallops, or rubs. Pulmonary/Chest: Clear to auscultation bilaterally, no wheezes, rales, or rhonchi. Abdominal: Soft, non-tender, non-distended, BS +,  Extremities: No lower extremity edema bilaterally,  pulses symmetric and intact bilaterally. No cyanosis or clubbing. Skin: Warm, dry and intact. No rashes or erythema. Psychiatric: Normal mood and affect. speech and behavior is normal. Cognition and memory are normal.  Assessment & Plan:   See Encounters Tab for problem based charting.  Patient discussed with Dr. Dareen Piano.

## 2019-05-02 NOTE — Assessment & Plan Note (Signed)
On exam she was having milky white discharge. She is high risk for candidal infection due to uncontrolled diabetes.  No recent antibiotic use.  -Wet prep was sent -Gave her a dose of Diflucan 150 mg for 2 doses 3 days apart.

## 2019-05-02 NOTE — Assessment & Plan Note (Signed)
Her pain was better since her steroid injection in July 2019. She started experiencing worsening of pain and wanted to repeat the injection as pain interfere with her work.  Advised her to come back on Friday afternoon for steroid injection in her right elbow.

## 2019-05-03 LAB — CERVICOVAGINAL ANCILLARY ONLY
Bacterial vaginitis: NEGATIVE
Candida vaginitis: POSITIVE — AB
Trichomonas: NEGATIVE

## 2019-05-04 NOTE — Progress Notes (Signed)
Internal Medicine Clinic Attending  Case discussed with Dr. Amin at the time of the visit.  We reviewed the resident's history and exam and pertinent patient test results.  I agree with the assessment, diagnosis, and plan of care documented in the resident's note.    

## 2019-05-20 ENCOUNTER — Ambulatory Visit (INDEPENDENT_AMBULATORY_CARE_PROVIDER_SITE_OTHER): Payer: Self-pay | Admitting: Internal Medicine

## 2019-05-20 ENCOUNTER — Ambulatory Visit
Admission: RE | Admit: 2019-05-20 | Discharge: 2019-05-20 | Disposition: A | Discharge status: Home / Self Care | Payer: Self-pay | Source: Ambulatory Visit | Attending: Internal Medicine | Admitting: Internal Medicine

## 2019-05-20 ENCOUNTER — Telehealth: Payer: Self-pay | Admitting: *Deleted

## 2019-05-20 ENCOUNTER — Other Ambulatory Visit: Payer: Self-pay

## 2019-05-20 DIAGNOSIS — S9031XA Contusion of right foot, initial encounter: Secondary | ICD-10-CM

## 2019-05-20 DIAGNOSIS — W228XXA Striking against or struck by other objects, initial encounter: Secondary | ICD-10-CM

## 2019-05-20 DIAGNOSIS — S99921A Unspecified injury of right foot, initial encounter: Secondary | ICD-10-CM | POA: Insufficient documentation

## 2019-05-20 NOTE — Telephone Encounter (Signed)
Pt stated she stumped her toe on a shopping cart yesterday - it turned purplish. Now purple/blue color has spread to her second toe and on top of her foot nearr her toes. Unable to put a lot of pressure on the bottom of her foot. Hx of diabetes. Suggested going to UC but she does not [employer]. She has an appt on Monday with Dr Reesa Chew. We have no open appts this am. [facility] telehealth visit scheduled for today between 1-3PM- explained to pt who's agreeable.

## 2019-05-20 NOTE — Progress Notes (Signed)
  [facility] Internal Medicine Residency Telephone Encounter Continuity Care Appointment  HPI:   This telephone encounter was created for [name] on 05/20/2019 for the following purpose/cc bruising and swelling of right toes and foot.    Apparently patient hit her right foot against a shopping cart 2 days ago, initially got swelling and bruising of small toe which spread all across her toes and foot.  She cannot bear weight on that and having excruciating pain.  Does not have any open wound.  She does not have any other upper respiratory symptoms or change in her smell or taste.   Past Medical History:  Past Medical History:  Diagnosis Date  . Arthritis    "beginnings in my hands" (05/26/2013)  . Diabetes mellitus without complication ([place]) 9179  . GERD (gastroesophageal reflux disease)    otc meds prn  . Migraines, neuralgic    '~ 3/week" (05/26/2013) - otc meds prn  . Morbid obesity ([place])   . Osgood-Schlatter's disease of left knee    left knee  . PCOS (polycystic ovarian syndrome)   . Seasonal allergies   . Tooth pain 05/26/2013   "bottom right" (05/26/2013)  . Uterine fibroid       ROS:   Negative except mentioned in HPI   Assessment / Plan / Recommendations:   Please see A&P under problem oriented charting for assessment of the patient's acute and chronic medical conditions.   As always, pt is advised that if symptoms worsen or new symptoms arise, they should go to an urgent care facility or to to ER for further evaluation.   Consent and Medical Decision Making:   Patient discussed with Dr. Rebeca Alert  This is a telephone encounter between Whitney Wagner and [place] on 05/20/2019 for right foot bruising and pain. The visit was conducted with the patient located at home and [place] at [facility]. The patient's identity was confirmed using their DOB and current address. The patient has consented to being evaluated through a telephone encounter and  understands the associated risks (an examination cannot be done and the patient may need to come in for an appointment) / benefits (allows the patient to remain at home, decreasing exposure to coronavirus). I personally spent 13 minutes on medical discussion.

## 2019-05-20 NOTE — Progress Notes (Signed)
Internal Medicine Clinic Attending  Case discussed with Dr. Amin at the time of the visit.  We reviewed the resident's history and exam and pertinent patient test results.  I agree with the assessment, diagnosis, and plan of care documented in the resident's note.  Alexander Raines, M.D., Ph.D.  

## 2019-05-20 NOTE — Assessment & Plan Note (Signed)
Patient injured her right foot by hitting a shopping cart 2 days ago.  Experiencing worsening edema and bruising. Unable to bear any weight with excruciating pain.  Might sustained a fracture.  X-ray of right foot ordered. Advised patient to use icing, naproxen 500 mg twice daily, Ace wrap, keep her foot elevated. We will call her with results. She has an appointment with PCP on Monday.

## 2019-05-20 NOTE — Telephone Encounter (Signed)
Okay, thank you for letting me know. Please add the patient to this morning [facility] schedule so that we can call her this morning

## 2019-05-23 ENCOUNTER — Ambulatory Visit: Payer: Self-pay | Admitting: Internal Medicine

## 2019-05-23 ENCOUNTER — Encounter: Payer: Self-pay | Admitting: Internal Medicine

## 2019-05-23 ENCOUNTER — Other Ambulatory Visit: Payer: Self-pay

## 2019-05-23 VITALS — BP 146/77 | HR 96 | Temp 99.0°F | Ht 62.0 in | Wt 282.6 lb

## 2019-05-23 DIAGNOSIS — S99921A Unspecified injury of right foot, initial encounter: Secondary | ICD-10-CM

## 2019-05-23 DIAGNOSIS — E1169 Type 2 diabetes mellitus with other specified complication: Secondary | ICD-10-CM

## 2019-05-23 DIAGNOSIS — M7701 Medial epicondylitis, right elbow: Secondary | ICD-10-CM

## 2019-05-23 DIAGNOSIS — Z794 Long term (current) use of insulin: Secondary | ICD-10-CM

## 2019-05-23 DIAGNOSIS — E118 Type 2 diabetes mellitus with unspecified complications: Secondary | ICD-10-CM

## 2019-05-23 DIAGNOSIS — X58XXXA Exposure to other specified factors, initial encounter: Secondary | ICD-10-CM

## 2019-05-23 DIAGNOSIS — R030 Elevated blood-pressure reading, without diagnosis of hypertension: Secondary | ICD-10-CM

## 2019-05-23 LAB — POCT GLYCOSYLATED HEMOGLOBIN (HGB A1C): Hemoglobin A1C: 10.2 % — AB (ref 4.0–5.6)

## 2019-05-23 LAB — GLUCOSE, CAPILLARY: Glucose-Capillary: 181 mg/dL — ABNORMAL HIGH (ref 70–99)

## 2019-05-23 MED ORDER — LISINOPRIL 10 MG PO TABS: 10.0000 mg | ORAL_TABLET | Freq: Every day | ORAL | 2 refills | Status: DC

## 2019-05-23 MED ORDER — DULAGLUTIDE 1.5 MG/0.5ML ~~LOC~~ SOAJ: 1.5000 mg | SUBCUTANEOUS | 6 refills | Status: DC

## 2019-05-23 MED ORDER — INSULIN DETEMIR 100 UNIT/ML ~~LOC~~ SOLN: 40.0000 [IU] | Freq: Every day | SUBCUTANEOUS | 11 refills | Status: DC

## 2019-05-23 MED FILL — LEVEMIR 100 UNITS/ML VIAL: 100 | 25 days supply | Qty: 10 | Fill #0

## 2019-05-23 NOTE — Assessment & Plan Note (Signed)
Blood pressure was initially elevated to 317 systolic which improved to 122/79 on recheck. Per patient whenever she checked at [facility] her blood pressure is mostly in 140s.  -Advised patient to check her blood pressure at home while resting and bring that log with her during her next follow-up appointment in 2 weeks. -If remains elevated we will start her on lisinopril 10 mg daily and will titrate accordingly.

## 2019-05-23 NOTE — Patient Instructions (Signed)
Thank you for visiting clinic today.   As we discussed I am increasing the dose of Levemir from 35 to 40 units at bedtime, continue checking your blood sugar daily and bring your log with you during next follow-up appointment. If you start taking Trulicity, please call our clinic and at that point we will discontinue Janumet and restart you just on metformin. Your initial blood pressure was high but during recheck your blood pressure was within normal limit, please check your blood pressure while you are relaxed and resting at home and bring that log with you during your next follow-up visit in 2 weeks.  We will hold off to blood pressure medications at this time.

## 2019-05-23 NOTE — Assessment & Plan Note (Signed)
Patient is having uncontrolled diabetes with rise in her A1c of 10.4.  It was 8.2 45-month ago. Patient was not following her diabetic diet during staying at home, recently restarted following her diabetic diet. Unable to download her glucose meter but her blood sugar remains mostly in 200s and 300s.  Today it was 181.  Her Levemir was increased to 35 units at bedtime during previous clinic visit 2 weeks ago. Denies any hypoglycemic event. She has not started Trulicity yet, per patient she found some good coupons and will try her to pick up Trulicity, asking for a new prescription to be sent to [facility] which was done. Once she started Trulicity, we will discontinue Janumet and start her on metformin. -Increase Levemir to 40 units at bedtime.

## 2019-05-23 NOTE — Progress Notes (Signed)
   CC: For follow-up of her diabetes and right foot injury.  HPI:  [name] is a 43 y.o. with past medical history as listed below came to the clinic for follow-up of her diabetes and right foot injury.  Please see assessment and plan for her chronic conditions.  Past Medical History:  Diagnosis Date  . Arthritis    "beginnings in my hands" (05/26/2013)  . Diabetes mellitus without complication ([place]) 5189  . GERD (gastroesophageal reflux disease)    otc meds prn  . Migraines, neuralgic    '~ 3/week" (05/26/2013) - otc meds prn  . Morbid obesity ([place])   . Osgood-Schlatter's disease of left knee    left knee  . PCOS (polycystic ovarian syndrome)   . Seasonal allergies   . Tooth pain 05/26/2013   "bottom right" (05/26/2013)  . Uterine fibroid    Review of Systems: Negative except mentioned in HPI.  Physical Exam:  Vitals:   05/23/19 1530  BP: (!) 146/77  Pulse: 96  Temp: 99 F (37.2 C)  SpO2: 99%  Weight: 282 lb 9.6 oz (128.2 kg)  Height: 5\' 2"  (1.575 m)   General: Vital signs reviewed.  Patient is well-developed and well-nourished, in no acute distress and cooperative with exam.  Head: Normocephalic and atraumatic. Eyes: EOMI, conjunctivae normal, no scleral icterus.  Cardiovascular: RRR, S1 normal, S2 normal, no murmurs, gallops, or rubs. Pulmonary/Chest: Clear to auscultation bilaterally, no wheezes, rales, or rhonchi. Abdominal: Soft, non-tender, non-distended, BS +, Musculoskeletal: Healing bruising on right lateral foot.  No edema or hyperthermia. Extremities: No lower extremity edema bilaterally,  pulses symmetric and intact bilaterally. No cyanosis or clubbing. Skin: Warm, dry and intact. No rashes or erythema. Psychiatric: Normal mood and affect. speech and behavior is normal. Cognition and memory are normal.  Assessment & Plan:   See Encounters Tab for problem based charting.  Patient discussed with Dr. Lynnae January.

## 2019-05-23 NOTE — Assessment & Plan Note (Signed)
Her pain is coming back and would like to have another steroid injection.  She will come back in 2 weeks when Dr. Heber Sutherland or Dr. Evette Doffing will be available.

## 2019-05-23 NOTE — Assessment & Plan Note (Signed)
Her foot x-ray was without any acute fracture. Her pain and swelling is improving.  -Continue supportive measures until pain completely resolved.

## 2019-05-24 NOTE — Progress Notes (Signed)
Internal Medicine Clinic Attending  Case discussed with Dr. Amin at the time of the visit.  We reviewed the resident's history and exam and pertinent patient test results.  I agree with the assessment, diagnosis, and plan of care documented in the resident's note.    

## 2019-05-30 ENCOUNTER — Other Ambulatory Visit: Payer: Self-pay | Admitting: Internal Medicine

## 2019-05-30 DIAGNOSIS — E118 Type 2 diabetes mellitus with unspecified complications: Secondary | ICD-10-CM

## 2019-05-30 DIAGNOSIS — M542 Cervicalgia: Secondary | ICD-10-CM

## 2019-05-30 DIAGNOSIS — Z794 Long term (current) use of insulin: Secondary | ICD-10-CM

## 2019-05-30 MED FILL — CYCLOBENZAPRINE 5 MG TABLET: 5 | 10 days supply | Qty: 30 | Fill #0

## 2019-05-30 MED FILL — ATORVASTATIN 20 MG TABLET: 20 | 30 days supply | Qty: 30 | Fill #0

## 2019-05-30 MED FILL — JANUMET 50-1,000 MG TABLET: 50-1000 | 30 days supply | Qty: 60 | Fill #0

## 2019-05-30 MED FILL — MELOXICAM 15 MG TABLET: 15 | 30 days supply | Qty: 30 | Fill #0

## 2019-06-06 ENCOUNTER — Encounter: Payer: Self-pay | Admitting: Internal Medicine

## 2019-06-13 ENCOUNTER — Other Ambulatory Visit: Payer: Self-pay

## 2019-06-13 ENCOUNTER — Ambulatory Visit (INDEPENDENT_AMBULATORY_CARE_PROVIDER_SITE_OTHER): Payer: Self-pay | Admitting: Internal Medicine

## 2019-06-13 DIAGNOSIS — R030 Elevated blood-pressure reading, without diagnosis of hypertension: Secondary | ICD-10-CM

## 2019-06-13 DIAGNOSIS — E119 Type 2 diabetes mellitus without complications: Secondary | ICD-10-CM

## 2019-06-13 DIAGNOSIS — Z79899 Other long term (current) drug therapy: Secondary | ICD-10-CM

## 2019-06-13 DIAGNOSIS — M7701 Medial epicondylitis, right elbow: Secondary | ICD-10-CM

## 2019-06-13 MED ORDER — LISINOPRIL 10 MG PO TABS: 10.0000 mg | ORAL_TABLET | Freq: Every day | ORAL | 4 refills | Status: DC

## 2019-06-13 MED FILL — LISINOPRIL 10 MG TABS: 10 | 30 days supply | Qty: 30 | Fill #0

## 2019-06-13 NOTE — Assessment & Plan Note (Signed)
BP Readings from Last 3 Encounters:  06/13/19 (!) 147/87  05/23/19 (!) 146/77  05/02/19 (!) 115/54   Blood pressure remains elevated today. She did bring a blood pressure log from home which shows readings mostly between 780Q to 447Z systolic.  Discussed the benefit of starting low-dose lisinopril as she is diabetic. -Started her on lisinopril 10 mg daily. -She will follow-up in 2 weeks for repeat blood pressure check and BMP.

## 2019-06-13 NOTE — Patient Instructions (Signed)
Thank you for visiting clinic today. As we discussed I am starting you on a blood pressure medicine called lisinopril.  Take it daily. You would also provide you with a steroid injection on your right elbow today. Please follow-up in 2 weeks for blood pressure check.   Joint Steroid Injection A joint steroid injection is a procedure to relieve swelling and pain in a joint. Steroids are medicines that reduce inflammation. In this procedure, your health care provider uses a syringe and a needle to inject a steroid medicine into a painful and inflamed joint. A pain-relieving medicine (anesthetic) may be injected along with the steroid. In some cases, your health care provider may use an imaging technique such as ultrasound or fluoroscopy to guide the injection. Joints that are often treated with steroid injections include the knee, shoulder, hip, and spine. These injections may also be used in the elbow, ankle, and joints of the hands or feet. You may have joint steroid injections as part of your treatment for inflammation caused by:  Gout.  Rheumatoid arthritis.  Advanced wear-and-tear arthritis (osteoarthritis).  Tendinitis.  Bursitis. Joint steroid injections may be repeated, but having them too often can damage a joint or the skin over the joint. You should not have joint steroid injections less than 6 weeks apart or more than four times a year. Tell a health care provider about:  Any allergies you have.  All medicines you are taking, including vitamins, herbs, eye drops, creams, and over-the-counter medicines.  Any problems you or family members have had with anesthetic medicines.  Any blood disorders you have.  Any surgeries you have had.  Any medical conditions you have.  Whether you are pregnant or may be pregnant. What are the risks? Generally, this is a safe treatment. However, problems may occur, including:  Infection.  Bleeding.  Allergic reactions to medicines.   Damage to the joint or tissues around the joint.  Thinning of skin or loss of skin color over the joint.  Temporary flushing of the face or chest.  Temporary increase in pain.  Temporary increase in blood sugar.  Failure to relieve inflammation or pain. What happens before the treatment?  You may have imaging tests of your joint.  Ask your health care provider about: ? Changing or stopping your regular medicines. This is especially important if you are taking diabetes medicines or blood thinners. ? Taking medicines such as aspirin and ibuprofen. These medicines can thin your blood. Do not take these medicines unless your health care provider tells you to take them. ? Taking over-the-counter medicines, vitamins, herbs, and supplements.  Ask your health care provider if you can drive yourself home after the procedure. What happens during the treatment?   Your health care provider will position you for the injection and locate the injection site over your joint.  The skin over the joint will be cleaned with a germ-killing soap.  Your health care provider may: ? Spray a numbing solution (topical anesthetic) over the injection site. ? Inject a local anesthetic under the skin above your joint.  The needle will be placed through your skin into your joint. Your health care provider may use imaging to guide the needle to the right spot for the injection. If imaging is used, a special contrast dye may be injected to confirm that the needle is in the correct location.  The steroid medicine will be injected into your joint.  Anesthetic may be injected along with the steroid. This may be  a medicine that relieves pain for a short time (short-acting anesthetic) or for a longer time (long-acting anesthetic).  The needle will be removed, and an adhesive bandage (dressing) will be placed over the injection site. The procedure may vary among health care providers and hospitals. What can I expect  after the treatment?  You will be able to go home after the treatment.  It is normal to feel slight flushing for a few days after the injection.  After the treatment, it is common to have an increase in joint pain after the anesthetic has worn off. This may happen about an hour after a short-acting anesthetic or about 8 hours after a longer-acting anesthetic.  You should begin to feel relief from joint pain and swelling after 24 to 48 hours. Follow these instructions at home: Injection site care  Leave the adhesive dressing over your injection site in place until your health care provider says you can remove it.  Check your injection site every day for signs of infection. Check for: ? Redness, swelling, or pain. ? Fluid or blood. ? Warmth. ? Pus or a bad smell. Activity  Return to your normal activities as told by your health care provider. Ask your health care provider what activities are safe for you. You may be asked to limit activities that put stress on the joint for a few days.  Do joint exercises as told by your health care provider.  Do not take baths, swim, or use a hot tub until your health care provider approves. Managing pain, stiffness, and swelling   If directed, put ice on the joint. ? Put ice in a plastic bag. ? Place a towel between your skin and the bag. ? Leave the ice on for 20 minutes, 2-3 times a day.  Raise (elevate) your joint above the level of your heart when you are sitting or lying down. General instructions  Take over-the-counter and prescription medicines only as told by your health care provider.  Do not use any products that contain nicotine or tobacco, such as cigarettes, e-cigarettes, and chewing tobacco. These can delay joint healing. If you need help quitting, ask your health care provider.  If you have diabetes, be aware that your blood sugar may be slightly elevated for several days after the injection.  Keep all follow-up visits as  told by your health care provider. This is important. Contact a health care provider if you have:  Chills or a fever.  Any signs of infection at your injection site.  Increased pain or swelling or no relief after 2 days. Summary  A joint steroid injection is a treatment to relieve pain and swelling in a joint.  Steroids are medicines that reduce inflammation. Your health care provider may add an anesthetic along with the steroid.  You may have joint steroid injections as part of your arthritis treatment.  Joint steroid injections may be repeated, but having them too often can damage a joint or the skin over the joint.  Contact your health care provider if you have a fever, chills, or signs of infection or if you get no relief from joint pain or swelling. This information is not intended to replace advice given to you by your health care provider. Make sure you discuss any questions you have with your health care provider. Document Released: 08/10/2018 Document Revised: 08/10/2018 Document Reviewed: 08/10/2018 Elsevier Interactive Patient Education  2019 [employer].

## 2019-06-13 NOTE — Assessment & Plan Note (Signed)
Continue to experience right elbow pain asking for a steroid which was provided today.  Elbow injection Procedure Note  Pre-operative Diagnosis: right medial epicondylitis.  Indications: Symptomatic relief of medial epicondylitis.  Procedure Details    Consent was obtained for the procedure.  Point tenderness at medial condyle was located by palpation.  The injection site at the elbow was prepped with iodine. Using a 22 gauge needle the medial epicondyles was injected with 2 mL 1% lidocaine and 1 mL of betamethasone (CELESTONE). The needle was removed and a dressing was applied.  Complications:  None; patient tolerated the procedure well.

## 2019-06-13 NOTE — Progress Notes (Signed)
   CC: Blood pressure check and right elbow injection.  HPI:  Whitney Wagner is a 43 y.o. with past medical history as listed below came to the clinic for her blood pressure check and to get a steroid injection for her right golfer's elbow.  Please see assessment and plan for her chronic conditions.  Past Medical History:  Diagnosis Date  . Arthritis    "beginnings in my hands" (05/26/2013)  . Diabetes mellitus without complication ([place]) 3235  . GERD (gastroesophageal reflux disease)    otc meds prn  . Migraines, neuralgic    '~ 3/week" (05/26/2013) - otc meds prn  . Morbid obesity ([place])   . Osgood-Schlatter's disease of left knee    left knee  . PCOS (polycystic ovarian syndrome)   . Seasonal allergies   . Tooth pain 05/26/2013   "bottom right" (05/26/2013)  . Uterine fibroid    Review of Systems: Negative except mentioned in HPI.  Physical Exam:  Vitals:   06/13/19 1539  BP: (!) 147/87  Pulse: 96  Temp: 99.1 F (37.3 C)  TempSrc: Oral  Weight: 280 lb 11.2 oz (127.3 kg)   Vitals:   06/13/19 1539  BP: (!) 147/87  Pulse: 96  Temp: 99.1 F (37.3 C)  TempSrc: Oral  Weight: 280 lb 11.2 oz (127.3 kg)   General: Vital signs reviewed.  Patient is well-developed and well-nourished, in no acute distress and cooperative with exam.  Head: Normocephalic and atraumatic. Eyes: EOMI, conjunctivae normal, no scleral icterus.  Cardiovascular: RRR, S1 normal, S2 normal, no murmurs, gallops, or rubs. Pulmonary/Chest: Clear to auscultation bilaterally, no wheezes, rales, or rhonchi. Abdominal: Soft, non-tender, non-distended, BS +, Musculoskeletal: Tenderness along right medial condyle.  Extremities: No lower extremity edema bilaterally,  pulses symmetric and intact bilaterally. No cyanosis or clubbing. Skin: Warm, dry and intact. No rashes or erythema. Psychiatric: Normal mood and affect. speech and behavior is normal. Cognition and memory are normal.  Assessment & Plan:    See Encounters Tab for problem based charting.  Patient seen with Dr. Angelia Mould

## 2019-06-15 NOTE — Progress Notes (Signed)
Internal Medicine Clinic Attending  I saw and evaluated the patient.  I personally confirmed the key portions of the history and exam documented by Dr. Reesa Chew and I reviewed pertinent patient test results.  The assessment, diagnosis, and plan were formulated together and I agree with the documentation in the resident's note.   I was present for the entire procedure.

## 2019-06-20 ENCOUNTER — Encounter: Payer: Self-pay | Admitting: *Deleted

## 2019-06-23 MED FILL — LEVEMIR 100 UNITS/ML VIAL: 100 | 25 days supply | Qty: 10 | Fill #1

## 2019-07-08 MED FILL — LISINOPRIL 10 MG TABS: 10 | 30 days supply | Qty: 30 | Fill #1

## 2019-07-08 MED FILL — JANUMET 50-1,000 MG TABLET: 50-1000 | 30 days supply | Qty: 60 | Fill #1

## 2019-07-08 MED FILL — ATORVASTATIN 20 MG TABLET: 20 | 30 days supply | Qty: 30 | Fill #1

## 2019-07-18 MED FILL — LEVEMIR 100 UNITS/ML VIAL: 100 | 25 days supply | Qty: 10 | Fill #2

## 2019-07-27 ENCOUNTER — Encounter: Payer: Self-pay | Admitting: Internal Medicine

## 2019-07-27 ENCOUNTER — Telehealth: Payer: Self-pay | Admitting: Pharmacist

## 2019-07-27 ENCOUNTER — Ambulatory Visit (INDEPENDENT_AMBULATORY_CARE_PROVIDER_SITE_OTHER): Payer: Self-pay | Admitting: Internal Medicine

## 2019-07-27 ENCOUNTER — Other Ambulatory Visit: Payer: Self-pay

## 2019-07-27 VITALS — BP 149/94 | HR 98 | Temp 97.8°F | Ht 63.0 in | Wt 277.0 lb

## 2019-07-27 DIAGNOSIS — E118 Type 2 diabetes mellitus with unspecified complications: Secondary | ICD-10-CM

## 2019-07-27 DIAGNOSIS — I10 Essential (primary) hypertension: Secondary | ICD-10-CM | POA: Insufficient documentation

## 2019-07-27 DIAGNOSIS — M542 Cervicalgia: Secondary | ICD-10-CM

## 2019-07-27 DIAGNOSIS — Z79899 Other long term (current) drug therapy: Secondary | ICD-10-CM

## 2019-07-27 DIAGNOSIS — L237 Allergic contact dermatitis due to plants, except food: Secondary | ICD-10-CM

## 2019-07-27 HISTORY — DX: Allergic contact dermatitis due to plants, except food: L23.7

## 2019-07-27 MED ORDER — METHYLPREDNISOLONE 4 MG PO TBPK: ORAL_TABLET | ORAL | 0 refills | Status: AC

## 2019-07-27 MED ORDER — MELOXICAM 15 MG PO TABS: 15.0000 mg | ORAL_TABLET | Freq: Every day | ORAL | 0 refills | Status: DC

## 2019-07-27 NOTE — Progress Notes (Signed)
   CC: rash   HPI:  [name] is a 43 y.o. female with PMHx listed below who presents for diffuse rash and pruritis after poison ivy exposure 4 days ago. Please see problem based charting for further details.   Past Medical History:  Diagnosis Date  . Arthritis    "beginnings in my hands" (05/26/2013)  . Diabetes mellitus without complication ([place]) 2536  . GERD (gastroesophageal reflux disease)    otc meds prn  . Migraines, neuralgic    '~ 3/week" (05/26/2013) - otc meds prn  . Morbid obesity ([place])   . Osgood-Schlatter's disease of left knee    left knee  . PCOS (polycystic ovarian syndrome)   . Seasonal allergies   . Tooth pain 05/26/2013   "bottom right" (05/26/2013)  . Uterine fibroid    Review of Systems:  Review of Systems  Constitutional: Negative for chills and fever.  Eyes: Negative for blurred vision and pain.  Skin: Positive for itching and rash.  Neurological: Negative for sensory change.    Physical Exam:  Vitals:   07/27/19 1358  BP: (!) 149/94  Pulse: 98  Temp: 97.8 F (36.6 C)  TempSrc: Oral  SpO2: 96%  Weight: 277 lb (125.6 kg)  Height: 5\' 3"  (1.6 m)   General: alert, well appearing female, NAD HEENT: left eye with erythema to edge of eyelid laterally. Scattered vesicles beneath mouth that spread down into her neck Skin: diffuse patches of erythematous vesicles along upper and lower extremities and chest  Neuro: A&Ox3; no focal deficits, normal gait  Psych: pleasant mood and affect   Assessment & Plan:   See Encounters Tab for problem based charting.  Patient discussed with Dr. Lynnae January

## 2019-07-27 NOTE — Assessment & Plan Note (Addendum)
Patient presents with 4 days of progressive poison ivy dermatitis. She has been using benadryl spray at home with minimal relief. It has continued to spread, most recently to the edge of her left eye. Denies any eye pain or visual disturbance.  Given that facial involvement and severity of rash, will prescribe 6 day methylprednisolone dosepack.  She has a history of poorly controlled type II DM. Have asked Dr. Maudie Mercury to add her to the glucocorticoid hyperglycemic protocol. Greatly appreciate her assistance.

## 2019-07-27 NOTE — Patient Instructions (Signed)
[name], It was nice meeting you. I have sent in a steroid dose pack for your poison ivy. This can raise your blood sugars so I will have our pharmacist call to check in and make sure you are doing ok.   For your blood pressure, we will check labs today since you were recently started on Lisinopril.   Plan to meet your new PCP in 1-2 months.   Take care,  Dr. Koleen Distance

## 2019-07-27 NOTE — Assessment & Plan Note (Signed)
Patient was started on Lisinopril 10 mg at her most recent PCP appointment in June. Blood pressure is mildly elevated at today's visit, but she states at home they have consistently been running around 120/80.  - check BMP today - continue Lisinopril 10 mg - follow-up with new PCP in 1 month

## 2019-07-28 LAB — BMP8+ANION GAP
Anion Gap: 17 mmol/L (ref 10.0–18.0)
BUN/Creatinine Ratio: 20 (ref 9–23)
BUN: 11 mg/dL (ref 6–24)
CO2: 22 mmol/L (ref 20–29)
Calcium: 9.1 mg/dL (ref 8.7–10.2)
Chloride: 99 mmol/L (ref 96–106)
Creatinine, Ser: 0.55 mg/dL — ABNORMAL LOW (ref 0.57–1.00)
GFR calc Af Amer: 133 mL/min/{1.73_m2} (ref >59)
GFR calc non Af Amer: 115 mL/min/{1.73_m2} (ref >59)
Glucose: 198 mg/dL — ABNORMAL HIGH (ref 65–99)
Potassium: 4 mmol/L (ref 3.5–5.2)
Sodium: 138 mmol/L (ref 134–144)

## 2019-07-29 NOTE — Progress Notes (Signed)
Internal Medicine Clinic Attending  Case discussed with Dr. Bloomfield at the time of the visit.  We reviewed the resident's history and exam and pertinent patient test results.  I agree with the assessment, diagnosis, and plan of care documented in the resident's note.  

## 2019-07-29 NOTE — Telephone Encounter (Signed)
Steroid-Induced Hyperglycemia Prevention and Management Whitney Wagner is a 43 y.o. female who meets criteria for [facility] glucose monitoring program (diabetes patient prescribed short course of steroids).  Patient started methylprednisolole dose pack 2 days ago. She reports her home BG increased as high as 180s the first day but are now controlled (in the 100s). She reports no signs/symptoms of hyper or hypoglycemia.  Advised patient to continue monitoring BG, if persistently 180s or > 200 over the weekend, increase insulin detemir to 42-44 units, contact clinic if further assistance is needed. Will plan to call patient again on Monday. Patient verbalized understanding by repeat back.  [name] 5:07 PM 07/29/2019

## 2019-08-01 NOTE — Telephone Encounter (Signed)
Patient states home BG control is well overall, reports no signs/symptoms of concern today other than a possible sprained ankle. Over the weekend, she states her BG was > 300 mg/dL a few times, so she increased insulin detemir a few units each day. She reports currently taking 45 units (originally was taking 40 units).  Today was her last dose of methylprednisolone. Patient was advised to increase insulin detemir by 2 units if blood glucose remains > 200, can return to 40 units daily tomorrow since finished with steroid course and if BG normalize. Advised patient to contact clinic if any further concerns arise. Patient verbalized understanding.

## 2019-08-03 ENCOUNTER — Ambulatory Visit (INDEPENDENT_AMBULATORY_CARE_PROVIDER_SITE_OTHER): Payer: Self-pay | Admitting: Internal Medicine

## 2019-08-03 ENCOUNTER — Ambulatory Visit (HOSPITAL_COMMUNITY)
Admission: RE | Admit: 2019-08-03 | Discharge: 2019-08-03 | Disposition: A | Discharge status: Home / Self Care | Payer: Self-pay | Source: Ambulatory Visit | Attending: Internal Medicine | Admitting: Internal Medicine

## 2019-08-03 ENCOUNTER — Encounter: Payer: Self-pay | Admitting: Internal Medicine

## 2019-08-03 ENCOUNTER — Other Ambulatory Visit: Payer: Self-pay

## 2019-08-03 ENCOUNTER — Telehealth: Payer: Self-pay

## 2019-08-03 VITALS — BP 115/72 | HR 116 | Ht 63.0 in | Wt 272.0 lb

## 2019-08-03 DIAGNOSIS — Z6841 Body Mass Index (BMI) 40.0 and over, adult: Secondary | ICD-10-CM

## 2019-08-03 DIAGNOSIS — L237 Allergic contact dermatitis due to plants, except food: Secondary | ICD-10-CM

## 2019-08-03 DIAGNOSIS — M25572 Pain in left ankle and joints of left foot: Secondary | ICD-10-CM

## 2019-08-03 MED ORDER — CALAMINE EX LOTN: 1.0000 "application " | TOPICAL_LOTION | CUTANEOUS | 0 refills | Status: DC | PRN

## 2019-08-03 MED ORDER — CLOBETASOL PROP EMOLLIENT BASE 0.05 % EX CREA: 5.0000 g | TOPICAL_CREAM | Freq: Two times a day (BID) | CUTANEOUS | 0 refills | Status: DC

## 2019-08-03 NOTE — Telephone Encounter (Signed)
Pt states she just finished the steroid dose pack for poison ivy, it is not healing. Requesting to speak with a nurse. Please call pt back.

## 2019-08-03 NOTE — Telephone Encounter (Signed)
Pt states poison ivy is spreading and also she has turned her ankle and is having problems [facility] 1545 today

## 2019-08-03 NOTE — Patient Instructions (Addendum)
Whitney Wagner, It was nice seeing you again. I have sent in 2 prescription creams for your poison ivy to treat the itching while the rash heals.   For your ankle pain, I am sending you for an x-ray and will let you know these results. In the mean time, we will continue treating it as an ankle sprain using the compression brace, elevating it as much as possible, icing several times a day. Taking the Meloxicam should also help. It should start to feel better over the next 2-3 weeks.   Take care! Dr. Koleen Distance

## 2019-08-03 NOTE — Progress Notes (Signed)
   CC: poison ivy, left ankle pain   HPI:  [name] is a 43 y.o. female with PMHx listed below who presents for follow-up on poison ivy dermatitis and acute left ankle pain. Please see problem based charting for further details.   Past Medical History:  Diagnosis Date  . Arthritis    "beginnings in my hands" (05/26/2013)  . Diabetes mellitus without complication ([place]) 9147  . GERD (gastroesophageal reflux disease)    otc meds prn  . Migraines, neuralgic    '~ 3/week" (05/26/2013) - otc meds prn  . Morbid obesity ([place])   . Osgood-Schlatter's disease of left knee    left knee  . PCOS (polycystic ovarian syndrome)   . Seasonal allergies   . Tooth pain 05/26/2013   "bottom right" (05/26/2013)  . Uterine fibroid    Review of Systems:   Constitutional: negative for fevers, chills HEENT: negative for rash around the eye, vision changes Resp: negative for shortness of breath Skin: positive for pruritis, rash. No increased warmth or swelling MSK: positive for left ankle pain; negative for other joint pain or swelling   Physical Exam:  Vitals:   08/03/19 1549  BP: 115/72  Pulse: (!) 116  SpO2: 97%  Weight: 272 lb (123.4 kg)  Height: 5\' 3"  (1.6 m)   General: alert, pleasant, morbidly obese female in NAD HEENT: previous erythema around left eye now resolved Skin: diffuse erythematous vesicles with surrounding excoriations on bilateral upper and lower extremities, chest and abdomen MSK: Left ankle with mild swelling, TTP over lateral malleolus. ROM limited due to pain, particularly with plantarflexion and inversion. Negative anterior drawer and tilt tests.   Assessment & Plan:   See Encounters Tab for problem based charting.  Patient discussed with Dr. Lynnae January

## 2019-08-05 ENCOUNTER — Encounter: Payer: Self-pay | Admitting: Internal Medicine

## 2019-08-05 DIAGNOSIS — M25572 Pain in left ankle and joints of left foot: Secondary | ICD-10-CM | POA: Insufficient documentation

## 2019-08-05 NOTE — Assessment & Plan Note (Signed)
Patient presents with 3 day history of left ankle pain. 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 Ankle feels very unstable to her, and she has been minimally weight bearing since the injury. She has been using a compression brace with some relief.  Physical exam is consistent with inversion injury. X-ray is negative for acute fracture.  Will treat with RICE therapy and Meloxicam. Encouraged her to continue wearing the brace, and it should improve over the next 2-3 weeks.

## 2019-08-05 NOTE — Assessment & Plan Note (Signed)
Patient presents for follow-up of poison ivy dermatitis. She completed 5 day Solumedrol pack. The areas on her face, particularly left eye that were concerning have improved. However, she feels the areas on her trunk and extremities have worsened. She has a difficult time not scratching everywhere. Reassured her that course is self-limited. Will treat symptomatically with topical steroid cream and calamine lotion.

## 2019-08-05 NOTE — Progress Notes (Signed)
Internal Medicine Clinic Attending  Case discussed with Dr. Bloomfield at the time of the visit.  We reviewed the resident's history and exam and pertinent patient test results.  I agree with the assessment, diagnosis, and plan of care documented in the resident's note.  

## 2019-08-09 MED FILL — LEVEMIR 100 UNITS/ML VIAL: 100 | 25 days supply | Qty: 10 | Fill #3

## 2019-08-09 MED FILL — LISINOPRIL 10 MG TABS: 10 | 30 days supply | Qty: 30 | Fill #2

## 2019-08-09 MED FILL — ATORVASTATIN 20 MG TABLET: 20 | 30 days supply | Qty: 30 | Fill #2

## 2019-08-15 ENCOUNTER — Other Ambulatory Visit: Payer: Self-pay | Admitting: *Deleted

## 2019-08-15 DIAGNOSIS — Z794 Long term (current) use of insulin: Secondary | ICD-10-CM

## 2019-08-15 DIAGNOSIS — E118 Type 2 diabetes mellitus with unspecified complications: Secondary | ICD-10-CM

## 2019-08-15 NOTE — Telephone Encounter (Signed)
Next appt scheduled 9/14 with PCP. 

## 2019-08-16 MED ORDER — JANUMET 50-1000 MG PO TABS: ORAL_TABLET | ORAL | 1 refills | Status: DC

## 2019-08-16 NOTE — Telephone Encounter (Signed)
sitaGLIPtin-metformin (JANUMET) 50-1000 MG tablet, REFILL REQUEST @  [place], [place] - [address]. [phone] (Phone) [phone] (Fax)   Pt states she is completely out, requesting the med to be filled by today.

## 2019-08-18 ENCOUNTER — Other Ambulatory Visit: Payer: Self-pay | Admitting: *Deleted

## 2019-08-18 DIAGNOSIS — E118 Type 2 diabetes mellitus with unspecified complications: Secondary | ICD-10-CM

## 2019-08-18 MED FILL — JANUMET 50-1,000 MG TABLET: 50-1000 | 30 days supply | Qty: 60 | Fill #0

## 2019-08-18 NOTE — Telephone Encounter (Signed)
Fax from [facility] - if pt is on their "IM Program" please re-send rx with this noted onit. Thanks

## 2019-08-22 MED ORDER — JANUMET 50-1000 MG PO TABS: ORAL_TABLET | ORAL | 1 refills | Status: DC

## 2019-09-05 ENCOUNTER — Other Ambulatory Visit: Payer: Self-pay

## 2019-09-05 ENCOUNTER — Ambulatory Visit: Payer: Self-pay | Admitting: Internal Medicine

## 2019-09-05 ENCOUNTER — Encounter: Payer: Self-pay | Admitting: Internal Medicine

## 2019-09-05 VITALS — BP 140/81 | HR 115 | Temp 98.0°F | Wt 276.8 lb

## 2019-09-05 DIAGNOSIS — M542 Cervicalgia: Secondary | ICD-10-CM

## 2019-09-05 DIAGNOSIS — Z2821 Immunization not carried out because of patient refusal: Secondary | ICD-10-CM

## 2019-09-05 DIAGNOSIS — E7800 Pure hypercholesterolemia, unspecified: Secondary | ICD-10-CM

## 2019-09-05 DIAGNOSIS — F17290 Nicotine dependence, other tobacco product, uncomplicated: Secondary | ICD-10-CM

## 2019-09-05 DIAGNOSIS — F17219 Nicotine dependence, cigarettes, with unspecified nicotine-induced disorders: Secondary | ICD-10-CM

## 2019-09-05 DIAGNOSIS — E1140 Type 2 diabetes mellitus with diabetic neuropathy, unspecified: Secondary | ICD-10-CM

## 2019-09-05 DIAGNOSIS — G629 Polyneuropathy, unspecified: Secondary | ICD-10-CM

## 2019-09-05 DIAGNOSIS — E118 Type 2 diabetes mellitus with unspecified complications: Secondary | ICD-10-CM

## 2019-09-05 DIAGNOSIS — E1142 Type 2 diabetes mellitus with diabetic polyneuropathy: Secondary | ICD-10-CM

## 2019-09-05 DIAGNOSIS — E1169 Type 2 diabetes mellitus with other specified complication: Secondary | ICD-10-CM

## 2019-09-05 DIAGNOSIS — Z794 Long term (current) use of insulin: Secondary | ICD-10-CM

## 2019-09-05 DIAGNOSIS — I10 Essential (primary) hypertension: Secondary | ICD-10-CM

## 2019-09-05 DIAGNOSIS — Z79899 Other long term (current) drug therapy: Secondary | ICD-10-CM

## 2019-09-05 LAB — POCT GLYCOSYLATED HEMOGLOBIN (HGB A1C): Hemoglobin A1C: 8.6 % — AB (ref 4.0–5.6)

## 2019-09-05 LAB — GLUCOSE, CAPILLARY: Glucose-Capillary: 197 mg/dL — ABNORMAL HIGH (ref 70–99)

## 2019-09-05 MED ORDER — "INSULIN SYRINGE 28G X 1/2"" 0.5 ML MISC": 32.0000 [IU] | Freq: Every day | 2 refills | Status: DC

## 2019-09-05 MED ORDER — LISINOPRIL 10 MG PO TABS: 10.0000 mg | ORAL_TABLET | Freq: Every day | ORAL | 4 refills | Status: DC

## 2019-09-05 MED ORDER — JANUMET 50-1000 MG PO TABS: ORAL_TABLET | ORAL | 1 refills | Status: DC

## 2019-09-05 MED ORDER — INSULIN DETEMIR 100 UNIT/ML ~~LOC~~ SOLN: 40.0000 [IU] | Freq: Every day | SUBCUTANEOUS | 11 refills | Status: DC

## 2019-09-05 MED ORDER — CYCLOBENZAPRINE HCL 5 MG PO TABS: ORAL_TABLET | ORAL | 0 refills | Status: DC

## 2019-09-05 MED ORDER — ATORVASTATIN CALCIUM 20 MG PO TABS: 20.0000 mg | ORAL_TABLET | Freq: Every day | ORAL | 3 refills | Status: DC

## 2019-09-05 MED FILL — ATORVASTATIN 20 MG TABLET: 20 | 30 days supply | Qty: 30 | Fill #0

## 2019-09-05 MED FILL — CYCLOBENZAPRINE 5 MG TABLET: 5 | 10 days supply | Qty: 30 | Fill #0

## 2019-09-05 MED FILL — LISINOPRIL 10 MG TABS: 10 | 30 days supply | Qty: 30 | Fill #0

## 2019-09-05 MED FILL — LEVEMIR 100 UNITS/ML VIAL: 100 | 25 days supply | Qty: 10 | Fill #0

## 2019-09-05 MED FILL — JANUMET 50-1,000 MG TABLET: 50-1000 | 30 days supply | Qty: 60 | Fill #0

## 2019-09-05 NOTE — Patient Instructions (Addendum)
Thank you for visiting [place] in clinic today.  Below is a summary of what we discussed:  1.  Type 2 diabetes: Your hemoglobin A1c today was 8.6.  Continue to do your best to avoid carbohydrate rich foods in sugar rich foods. - I refilled your Levemir and Janumet. You may pick this up at the [facility].  2.  Hypertension - Continue lisinopril 10 mg daily.  I refilled this medicine you may pick it up at the [facility]  3.  High cholesterol - Continue atorvastatin 20 mg daily.  I refilled this medication and you may pick it up at the [facility]  If you have any other comments questions or concerns, please feel free to reach out to [place].  Please schedule follow-up appointment for 3 months.

## 2019-09-05 NOTE — Progress Notes (Signed)
   CC: T2DM  HPI:  [name] is a 43 y.o. with a PMHx as noted below who presents today for follow up for T2DM.  The patient's last A1c was 10.3 three months ago.  The patient says she is ran out of most of her medications and needs refills.  She denies having polydipsia or polyuria at this time.  The patient notes that she ran out of Lyrica about 1 month ago and can tell a big difference in her neuropathy symptoms.  Says she used to smoke 2 ppd cigarettes. Stopped 6 years ago. Uses E-cig and is using 3mg  daily. Goal is to quit completely within the next month.  She is not interested in trying Chantix at this time, noting that she tried it before and did not work for her.   Patient says she has not had a flu shot but she does not want it.  I explained the risk and benefits of getting the flu shot and the patient says she will think about it for her next visit.  Past Medical History:  Diagnosis Date  . Arthritis    "beginnings in my hands" (05/26/2013)  . Diabetes mellitus without complication ([place]) 0000000  . GERD (gastroesophageal reflux disease)    otc meds prn  . Migraines, neuralgic    '~ 3/week" (05/26/2013) - otc meds prn  . Morbid obesity ([place])   . Osgood-Schlatter's disease of left knee    left knee  . PCOS (polycystic ovarian syndrome)   . Seasonal allergies   . Tooth pain 05/26/2013   "bottom right" (05/26/2013)  . Uterine fibroid    Review of Systems:  Systems have been reviewed are otherwise negative unless mentioned in the HPI.   Physical Exam:  There were no vitals filed for this visit.  Physical Exam Vitals signs reviewed.  Constitutional:      General: She is not in acute distress.    Appearance: Normal appearance. She is obese. She is not ill-appearing or toxic-appearing.  HENT:     Head: Normocephalic and atraumatic.  Eyes:     Extraocular Movements: Extraocular movements intact.     Conjunctiva/sclera: Conjunctivae normal.  Cardiovascular:     Rate and  Rhythm: Regular rhythm. Tachycardia present.     Pulses: Normal pulses.     Heart sounds: Normal heart sounds. No murmur. No friction rub. No gallop.   Pulmonary:     Effort: Pulmonary effort is normal. No respiratory distress.     Breath sounds: Normal breath sounds. No wheezing or rales.  Abdominal:     General: Bowel sounds are normal. There is no distension.     Palpations: Abdomen is soft.     Tenderness: There is no abdominal tenderness. There is no guarding.  Musculoskeletal:     Right lower leg: No edema.     Left lower leg: No edema.  Neurological:     General: No focal deficit present.     Mental Status: She is alert and oriented to person, place, and time.  Psychiatric:        Mood and Affect: Mood normal.    Assessment & Plan:   See Encounters Tab for problem based charting.  Patient seen with Dr. Angelia Mould

## 2019-09-05 NOTE — Assessment & Plan Note (Signed)
Patient was started on lisinopril 10 mg in June 2020.  Her blood pressure today is 140/81.  We will follow this up at her next visit -Continue lisinopril 10 mg daily

## 2019-09-05 NOTE — Assessment & Plan Note (Signed)
Uses e-cigarettes 3 mg daily.  She said this is significantly less than what she had been using in the past and has the goal of stopping smoking altogether by next month.  She states that she is tried Chantix in the past and does not want to try this now.  She feels motivated to stop -Follow-up at next visit

## 2019-09-05 NOTE — Assessment & Plan Note (Signed)
Patient states she ran out of her Lyrica 100 mg 3 times daily about a month ago and can tell her neuropathy in her toes have gotten worse.  She is trying to get approval through [employer] but needs an attendings information in order to apply.  Dr. Heber [place] was present and gave information needed.  Patient has not been worked up for B12 deficiency in the past.  We will initiate this work-up now - Follow-up at next visit - Vitamin B12 level today

## 2019-09-05 NOTE — Assessment & Plan Note (Addendum)
Patient's diabetes is in better control over the past 3 months.  Her last A1c was 10.4, and her current A1c is 8.6.  The patient currently takes Levemir 35 U nightly and 1 tablet of Janumet 50-1000 (sitagliptin-metformin) nightly.  At her last visit, it was discussed to start Trulicity, however the patient says she has not had a chance to follow-up with the health department in order to start this medication.  Given the significant decrease in A1c, we will continue to work on getting her this medication but continue the current regimen at this time.  Patient states she needs refills on her diabetes medications.  We will send this to the [facility] 35 U nightly - Continue Janumet 1 pill a day  - Consider starting Trulicity at next visit  - Follow-up in 3 months

## 2019-09-06 LAB — VITAMIN B12: Vitamin B-12: 347 pg/mL (ref 232–1245)

## 2019-10-03 ENCOUNTER — Other Ambulatory Visit: Payer: Self-pay | Admitting: Internal Medicine

## 2019-10-03 DIAGNOSIS — M25572 Pain in left ankle and joints of left foot: Secondary | ICD-10-CM

## 2019-10-03 MED ORDER — MELOXICAM 7.5 MG PO TABS: 15.0000 mg | ORAL_TABLET | Freq: Every day | ORAL | 2 refills | Status: DC

## 2019-10-03 MED FILL — LEVEMIR 100 UNITS/ML VIAL: 100 | 25 days supply | Qty: 10 | Fill #1

## 2019-10-03 NOTE — Telephone Encounter (Signed)
Needs refill on meloxicam ;pt contact [place], [place].

## 2019-10-03 NOTE — Telephone Encounter (Signed)
I called the patient to discuss Meloxicam refill. I sent the script to her pharmacy. Thanks

## 2019-10-04 MED FILL — MELOXICAM 7.5 MG TABLET: 7.5 | 15 days supply | Qty: 30 | Fill #0

## 2019-10-10 NOTE — Progress Notes (Signed)
Internal Medicine Clinic Attending ° °I saw and evaluated the patient.  I personally confirmed the key portions of the history and exam documented by Dr. Alexander and I reviewed pertinent patient test results.  The assessment, diagnosis, and plan were formulated together and I agree with the documentation in the resident’s note.  °

## 2019-10-13 MED FILL — LISINOPRIL 10 MG TABS: 10 | 30 days supply | Qty: 30 | Fill #1

## 2019-10-13 MED FILL — JANUMET 50-1,000 MG TABLET: 50-1000 | 30 days supply | Qty: 60 | Fill #1

## 2019-10-13 MED FILL — ATORVASTATIN 20 MG TABLET: 20 | 30 days supply | Qty: 30 | Fill #1

## 2019-10-14 MED FILL — MELOXICAM 7.5 MG TABLET: 7.5 | 15 days supply | Qty: 30 | Fill #0

## 2019-10-19 LAB — HM DIABETES EYE EXAM

## 2019-10-20 ENCOUNTER — Encounter: Payer: Self-pay | Admitting: *Deleted

## 2019-11-02 MED FILL — LEVEMIR 100 UNITS/ML VIAL: 100 | 25 days supply | Qty: 10 | Fill #2

## 2019-11-14 MED FILL — ATORVASTATIN 20 MG TABLET: 20 | 30 days supply | Qty: 30 | Fill #2

## 2019-11-14 MED FILL — JANUMET 50-1,000 MG TABLET: 50-1000 | 30 days supply | Qty: 60 | Fill #0

## 2019-11-14 MED FILL — LISINOPRIL 10 MG TABS: 10 | 30 days supply | Qty: 30 | Fill #2

## 2019-11-14 MED FILL — MELOXICAM 7.5 MG TABLET: 7.5 | 15 days supply | Qty: 30 | Fill #1

## 2019-11-25 MED FILL — LEVEMIR 100 UNITS/ML VIAL: 100 | 25 days supply | Qty: 10 | Fill #3

## 2019-11-28 ENCOUNTER — Other Ambulatory Visit: Payer: Self-pay

## 2019-11-28 ENCOUNTER — Encounter: Payer: Self-pay | Admitting: Internal Medicine

## 2019-11-28 ENCOUNTER — Ambulatory Visit: Payer: Self-pay | Admitting: Internal Medicine

## 2019-11-28 VITALS — BP 115/74 | HR 97 | Temp 97.8°F | Wt 280.2 lb

## 2019-11-28 DIAGNOSIS — Z791 Long term (current) use of non-steroidal anti-inflammatories (NSAID): Secondary | ICD-10-CM

## 2019-11-28 DIAGNOSIS — Z794 Long term (current) use of insulin: Secondary | ICD-10-CM

## 2019-11-28 DIAGNOSIS — E1169 Type 2 diabetes mellitus with other specified complication: Secondary | ICD-10-CM

## 2019-11-28 DIAGNOSIS — M159 Polyosteoarthritis, unspecified: Secondary | ICD-10-CM

## 2019-11-28 DIAGNOSIS — K589 Irritable bowel syndrome without diarrhea: Secondary | ICD-10-CM

## 2019-11-28 DIAGNOSIS — Z79899 Other long term (current) drug therapy: Secondary | ICD-10-CM

## 2019-11-28 DIAGNOSIS — G8929 Other chronic pain: Secondary | ICD-10-CM

## 2019-11-28 DIAGNOSIS — M25532 Pain in left wrist: Secondary | ICD-10-CM

## 2019-11-28 DIAGNOSIS — E119 Type 2 diabetes mellitus without complications: Secondary | ICD-10-CM

## 2019-11-28 DIAGNOSIS — M7552 Bursitis of left shoulder: Secondary | ICD-10-CM | POA: Insufficient documentation

## 2019-11-28 DIAGNOSIS — F17219 Nicotine dependence, cigarettes, with unspecified nicotine-induced disorders: Secondary | ICD-10-CM

## 2019-11-28 DIAGNOSIS — F17200 Nicotine dependence, unspecified, uncomplicated: Secondary | ICD-10-CM

## 2019-11-28 DIAGNOSIS — R232 Flushing: Secondary | ICD-10-CM | POA: Insufficient documentation

## 2019-11-28 DIAGNOSIS — Z90710 Acquired absence of both cervix and uterus: Secondary | ICD-10-CM

## 2019-11-28 HISTORY — DX: Bursitis of left shoulder: M75.52

## 2019-11-28 LAB — POCT GLYCOSYLATED HEMOGLOBIN (HGB A1C): Hemoglobin A1C: 9.2 % — AB (ref 4.0–5.6)

## 2019-11-28 LAB — GLUCOSE, CAPILLARY: Glucose-Capillary: 263 mg/dL — ABNORMAL HIGH (ref 70–99)

## 2019-11-28 MED ORDER — MELOXICAM 15 MG PO TABS: 15.0000 mg | ORAL_TABLET | Freq: Every day | ORAL | 2 refills | Status: DC

## 2019-11-28 MED ORDER — PANTOPRAZOLE SODIUM 40 MG PO TBEC: 40.0000 mg | DELAYED_RELEASE_TABLET | Freq: Every day | ORAL | 2 refills | Status: DC

## 2019-11-28 MED FILL — PANTOPRAZOLE SOD DR 40 MG T: 40 | 30 days supply | Qty: 30 | Fill #0

## 2019-11-28 MED FILL — MELOXICAM 15 MG TABLET: 15 | 30 days supply | Qty: 30 | Fill #0

## 2019-11-28 NOTE — Progress Notes (Signed)
   CC: Diabetes, hot flashes, osteoarthritis, shoulder pain  HPI:  [name] is a 43 y.o. female with PMHx listed below presenting for diabetes, hot flashes, osteoarthritis, shoulder pain. Please see the A&P for the status of the patient's chronic medical problems.  Past Medical History:  Diagnosis Date  . Arthritis    "beginnings in my hands" (05/26/2013)  . Diabetes mellitus without complication ([place]) 0000000  . GERD (gastroesophageal reflux disease)    otc meds prn  . Migraines, neuralgic    '~ 3/week" (05/26/2013) - otc meds prn  . Morbid obesity ([place])   . Osgood-Schlatter's disease of left knee    left knee  . PCOS (polycystic ovarian syndrome)   . Seasonal allergies   . Tooth pain 05/26/2013   "bottom right" (05/26/2013)  . Uterine fibroid    Review of Systems:  Performed and all others negative.  Physical Exam: Vitals:   11/28/19 1427  BP: 115/74  Pulse: 97  Temp: 97.8 F (36.6 C)  TempSrc: Oral  SpO2: 98%  Weight: 280 lb 3.2 oz (127.1 kg)   General: Obese female in no acute distress Pulm: Good air movement with no wheezing or crackles  CV: RRR, no murmurs, no rubs  Extremities: + Neer's on the left  Assessment & Plan:   See Encounters Tab for problem based charting.  Patient seen with Dr. Heber [place]

## 2019-11-28 NOTE — Assessment & Plan Note (Addendum)
Patient with uncontrolled type II diabetes. She states that she is currently taking Janumet 50-1000 mg BID and Levemir 40 units QHS. She has not started takingTrulicity as she is waiting on medication assistance through the manufacturer. She has not modified her diet. She continues to eat takeout for breakfast almost 5 days per week. She eats baked goods 1-2x weekly and potatoes 3-4x weekly. She does not exercise daily. 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  A/P: - Hemoglobin A1c up to 9.2  - Continue Janumet 50-1000 mg BID and Levemir 40 units QHS.  - Encouraged her to turn in her paperwork for her Trulicity  - Continue Atorvastatin 20 mg QD and Lisinopril 10 mg QD

## 2019-11-28 NOTE — Patient Instructions (Signed)
Thank you for allowing [place] to provide your care. Today we discussed:  1) Your hemoglobin A1c is up slightly compared to last time. Continue to work on getting your paperwork and to start true the city. Continue to take all your other medications as prescribed. Try to avoid baked goods and potatoes. Continue to work on weight loss.  2) For your hot flashes we will check a TSH today. I will call you if we need to do anything different.  3) For your chronic joint pain I have sent out a refill for your meloxicam in addition to Protonix to help protect her stomach. Please take the meloxicam with food as it places you at risk for bleeding.  4) For your shoulder pain I'm referring you to sports medicine and they can assess you for possible injection.  Please come back to see [place] in three months or sooner if any issues arise. Continue to work on tapering down your nicotine consumption.

## 2019-11-28 NOTE — Assessment & Plan Note (Signed)
Patient presents with progressive left shoulder pain. She states that is exacerbated anytime she has to elevate her arm. She has gotten some relief from her meloxicam. It seems to be getting worse. On physical exam she has a positive Neer's. 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 Steroid ejection given today.  Patient's left shoulder was prepped with Betadine. A steroid injection was performed at 29mL using 1% plain Lidocaine and 40 mg of Kenalog. This was well tolerated. We discussed that if she develops worsening pain, fevers, or worsening joint mobility to call or seek medical attention immediately.

## 2019-11-28 NOTE — Assessment & Plan Note (Addendum)
Patient has been working to decrease her nicotine consumption. She is down to 6 mg of nicotine daily from 12 mg. Her plan is to go to 3 mg, 0 mg, then stop.   Continue to encourage cessation.

## 2019-11-28 NOTE — Assessment & Plan Note (Signed)
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 She is on meloxicam 15 mg daily. She is not on anything for G.I. protection. She states that she gets a significant amount of relief from the meloxicam and helps her to function. We discussed the possible side effects of this medication.  Records reviewed. Recent BMP with normal renal function.  A/P: - Continue Meloxicam 15 mg QD  - Start Pantoprazole 40 mg QD

## 2019-11-28 NOTE — Assessment & Plan Note (Signed)
Patient voices concerns about hot flashes. States that they of been increasing in frequency over the last month. She has had a hysterectomy so she no longer has a menstrual cycle. She does still have both ovaries. She is unsure if there is a family history of premature menopause. She endorses occasional palpitations. She denies increased appetite. She is unsure about diarrhea she struggles with IBS.  A/P: - Check TSH

## 2019-11-29 ENCOUNTER — Telehealth: Payer: Self-pay | Admitting: Internal Medicine

## 2019-11-29 LAB — TSH: TSH: 2.22 u[IU]/mL (ref 0.450–4.500)

## 2019-11-29 NOTE — Telephone Encounter (Signed)
Called the patient to discuss her TSH results. We discussed that in order to diagnose premature ovarian failure without a family history we would need additional lab work. She would be willing to get this lab work at her next appointment with her primary care doctor.  In regards to her shoulder. She is status post left sided subacromial injection. She states that it feels much better today. She has had no fevers or worsening of pain.

## 2019-11-30 NOTE — Progress Notes (Signed)
Internal Medicine Clinic Attending  I saw and evaluated the patient.  I personally confirmed the key portions of the history and exam documented by Dr. Tarri Abernethy and I reviewed pertinent patient test results.  The assessment, diagnosis, and plan were formulated together and I agree with the documentation in the resident's note.   I was present for the entire procedure.

## 2019-12-14 MED FILL — PANTOPRAZOLE SOD DR 40 MG T: 40 | 30 days supply | Qty: 30 | Fill #0

## 2019-12-14 MED FILL — LEVEMIR 100 UNITS/ML VIAL: 100 | 25 days supply | Qty: 10 | Fill #4

## 2019-12-14 MED FILL — LISINOPRIL 10 MG TABS: 10 | 30 days supply | Qty: 30 | Fill #3

## 2019-12-14 MED FILL — MELOXICAM 15 MG TABLET: 15 | 30 days supply | Qty: 30 | Fill #0

## 2019-12-14 MED FILL — ATORVASTATIN 20 MG TABLET: 20 | 30 days supply | Qty: 30 | Fill #3

## 2019-12-14 MED FILL — JANUMET 50-1,000 MG TABLET: 50-1000 | 30 days supply | Qty: 60 | Fill #1

## 2019-12-26 ENCOUNTER — Telehealth: Payer: Self-pay | Admitting: Internal Medicine

## 2019-12-26 DIAGNOSIS — B379 Candidiasis, unspecified: Secondary | ICD-10-CM

## 2019-12-26 MED ORDER — FLUCONAZOLE 100 MG PO TABS: 150.0000 mg | ORAL_TABLET | Freq: Every day | ORAL | 0 refills | Status: AC

## 2019-12-26 NOTE — Telephone Encounter (Signed)
Called pt - stated she gets a yeast infection when her blood sugars spikes. She has tried OTC medication w/o any success; x 3 weeks. Thanks

## 2019-12-26 NOTE — Telephone Encounter (Signed)
I called the patient this evening to discuss her symptoms.  Patient states that she has had multiple yeast infections in the past and this feels the same as those.  She states that she is having foul-smelling and discharge in her vaginal region.  She also states this tends to happen when her blood sugars run high, which is currently the case.  I confirmed her pharmacy and ordered a one-time dose of Diflucan 150 mg.  Earlene Plater, MD Internal Medicine, PGY1 Pager: [phone]  12/26/2019,5:20 PM

## 2019-12-26 NOTE — Telephone Encounter (Signed)
Good news. Dr. Sheppard Coil is here in clinic this afternoon. He will be able to sort this out.

## 2019-12-26 NOTE — Telephone Encounter (Signed)
Pt wanted to know is physician can send a prescription for a yeast infection; [place]  Pt contact [phone]

## 2019-12-27 ENCOUNTER — Telehealth: Payer: Self-pay | Admitting: *Deleted

## 2019-12-27 DIAGNOSIS — B379 Candidiasis, unspecified: Secondary | ICD-10-CM

## 2019-12-27 MED ORDER — FLUCONAZOLE 150 MG PO TABS: 150.0000 mg | ORAL_TABLET | Freq: Every day | ORAL | 0 refills | Status: AC

## 2019-12-27 NOTE — Telephone Encounter (Signed)
Fax from [facility] - needs clarification of directions of Diflucan   "Take 1.5 tab daily Qty 1 pill". ! Tab or 1.5 tab? Re-send new rx  Thanks

## 2020-01-12 MED FILL — LEVEMIR 100 UNITS/ML VIAL: 100 | 25 days supply | Qty: 10 | Fill #5

## 2020-01-12 MED FILL — PANTOPRAZOLE SOD DR 40 MG T: 40 | 30 days supply | Qty: 30 | Fill #1

## 2020-01-12 MED FILL — ATORVASTATIN 20 MG TABLET: 20 | 30 days supply | Qty: 30 | Fill #4

## 2020-01-12 MED FILL — JANUMET 50-1,000 MG TABLET: 50-1000 | 30 days supply | Qty: 60 | Fill #1

## 2020-01-12 MED FILL — MELOXICAM 15 MG TABLET: 15 | 30 days supply | Qty: 30 | Fill #1

## 2020-01-12 MED FILL — LISINOPRIL 10 MG TABS: 10 | 30 days supply | Qty: 30 | Fill #4

## 2020-02-07 ENCOUNTER — Encounter: Payer: Self-pay | Admitting: Internal Medicine

## 2020-02-07 ENCOUNTER — Ambulatory Visit: Payer: Self-pay | Admitting: Internal Medicine

## 2020-02-07 VITALS — BP 125/74 | HR 120 | Temp 98.0°F | Wt 281.0 lb

## 2020-02-07 DIAGNOSIS — B373 Candidiasis of vulva and vagina: Secondary | ICD-10-CM

## 2020-02-07 DIAGNOSIS — B3731 Acute candidiasis of vulva and vagina: Secondary | ICD-10-CM

## 2020-02-07 DIAGNOSIS — L0231 Cutaneous abscess of buttock: Secondary | ICD-10-CM

## 2020-02-07 DIAGNOSIS — R000 Tachycardia, unspecified: Secondary | ICD-10-CM

## 2020-02-07 MED ORDER — SULFAMETHOXAZOLE-TRIMETHOPRIM 400-80 MG PO TABS: 1.0000 | ORAL_TABLET | Freq: Two times a day (BID) | ORAL | 0 refills | Status: AC

## 2020-02-07 MED ORDER — FLUCONAZOLE 100 MG PO TABS: 150.0000 mg | ORAL_TABLET | Freq: Every day | ORAL | 0 refills | Status: AC

## 2020-02-07 NOTE — Assessment & Plan Note (Signed)
Vaginal yeast infection: She states that she has been recently experiencing white vaginal discharge, itching and burning with urination.  On further questioning, she reports that her symptoms are similar to her prior vaginal yeast infections which improved with Diflucan.   Plan: -Diflucan 150 mg on day 1 and repeat on day 3

## 2020-02-07 NOTE — Patient Instructions (Signed)
Ms. Prak,   Thanks for seeing [place] today. We did an incision and drainage of the abscess. You will take bactrim 1 pill twice a day for 7 days.   For the vaginal yeast infection, I prescribed diflucan.   Take care! Dr. Eileen Stanford  Please call the internal medicine center clinic if you have any questions or concerns, we may be able to help and keep you from a long and expensive emergency room wait. Our clinic and after hours phone number is [phone], the best time to call is Monday through Friday 9 am to 4 pm but there is always someone available 24/7 if you have an emergency. If you need medication refills please notify your pharmacy one week in advance and they will send [place] a request.

## 2020-02-07 NOTE — Progress Notes (Signed)
   CC: Left gluteal abscess, Vaginal yeast infection   HPI:  [name] is a 44 y.o. with medical history significant for uncontrolled diabetes mellitus here for evaluation of left gluteal pain  Please see problem discharge for further details.  Past Medical History:  Diagnosis Date  . Arthritis    "beginnings in my hands" (05/26/2013)  . Diabetes mellitus without complication ([place]) 0000000  . GERD (gastroesophageal reflux disease)    otc meds prn  . Migraines, neuralgic    '~ 3/week" (05/26/2013) - otc meds prn  . Morbid obesity ([place])   . Osgood-Schlatter's disease of left knee    left knee  . PCOS (polycystic ovarian syndrome)   . Seasonal allergies   . Tooth pain 05/26/2013   "bottom right" (05/26/2013)  . Uterine fibroid    Review of Systems:  As per HPI  Physical Exam:  Vitals:   02/07/20 1535  BP: 125/74  Pulse: (!) 120  Temp: 98 F (36.7 C)  TempSrc: Oral  SpO2: 100%  Weight: 281 lb (127.5 kg)   Physical Exam  Constitutional: Distressed: Moderate distress due to pain.  Cardiovascular: Tachycardia present.  Skin: Skin is warm. Lesion noted. She is not diaphoretic. There is erythema.       Assessment & Plan:   See Encounters Tab for problem based charting.  Patient discussed with Dr. Heber [place]

## 2020-02-07 NOTE — Assessment & Plan Note (Signed)
Left gluteal abscess: Whitney Wagner states that she was in her usual state of health until 4 days ago when she began experiencing pain and swelling in her left gluteal area.  The pain progressively worsened and yesterday, she noticed a pocket of fluid collection that she tried draining with no success.  She denies fevers or chills.  She states that she has had prior gluteal abscesses in the past that has been drained.  On physical exams, she did have a 3 x 2 cm area of fluid collection that was indurated, erythematous, warm to touch and tender to palpation.  Procedure: The area was prepped with Betadine.  10 cc of lidocaine was used as local anesthetic around the abscess.  A 1 cm incision was made in the fluid pocket with noticeable drainage of purulent material.  Patient tolerated the procedure well.  The area was successfully cleaned and dressed with 4 x 4 gauze and tape.  Plan: -She will complete a 7-day course of Bactrim -Return to clinic as needed -Requires better control of diabetes

## 2020-02-08 NOTE — Progress Notes (Signed)
Internal Medicine Clinic Attending  I saw and evaluated the patient.  I personally confirmed the key portions of the history and exam documented by Dr. Eileen Stanford and I reviewed pertinent patient test results.  The assessment, diagnosis, and plan were formulated together and I agree with the documentation in the resident's note.   I was present and assisted during the procedure.

## 2020-02-10 ENCOUNTER — Other Ambulatory Visit: Payer: Self-pay | Admitting: Student in an Organized Health Care Education/Training Program

## 2020-02-10 ENCOUNTER — Other Ambulatory Visit: Payer: Self-pay | Admitting: Internal Medicine

## 2020-02-10 DIAGNOSIS — I10 Essential (primary) hypertension: Secondary | ICD-10-CM

## 2020-02-10 DIAGNOSIS — E118 Type 2 diabetes mellitus with unspecified complications: Secondary | ICD-10-CM

## 2020-02-10 DIAGNOSIS — Z794 Long term (current) use of insulin: Secondary | ICD-10-CM

## 2020-02-10 MED FILL — PANTOPRAZOLE SOD DR 40 MG T: 40 | 30 days supply | Qty: 30 | Fill #2

## 2020-02-10 MED FILL — LEVEMIR 100 UNITS/ML VIAL: 100 | 25 days supply | Qty: 10 | Fill #6

## 2020-02-10 MED FILL — ATORVASTATIN 20 MG TABLET: 20 | 30 days supply | Qty: 30 | Fill #5

## 2020-02-10 MED FILL — MELOXICAM 15 MG TABLET: 15 | 30 days supply | Qty: 30 | Fill #2

## 2020-02-10 NOTE — Telephone Encounter (Signed)
Next appt scheduled 3/15 with PCP.

## 2020-02-14 ENCOUNTER — Telehealth: Payer: Self-pay | Admitting: *Deleted

## 2020-02-14 MED FILL — JANUMET 50-1,000 MG TABLET: 50-1000 | 30 days supply | Qty: 60 | Fill #0

## 2020-02-14 MED FILL — LISINOPRIL 10 MG TABS: 10 | 30 days supply | Qty: 30 | Fill #0

## 2020-02-16 NOTE — Telephone Encounter (Signed)
Review meds and visits

## 2020-03-05 ENCOUNTER — Encounter: Payer: Self-pay | Admitting: Internal Medicine

## 2020-03-05 ENCOUNTER — Other Ambulatory Visit: Payer: Self-pay

## 2020-03-05 ENCOUNTER — Ambulatory Visit: Payer: Self-pay | Admitting: Internal Medicine

## 2020-03-05 VITALS — BP 104/65 | HR 98 | Temp 98.4°F | Wt 280.0 lb

## 2020-03-05 DIAGNOSIS — Z794 Long term (current) use of insulin: Secondary | ICD-10-CM

## 2020-03-05 DIAGNOSIS — G629 Polyneuropathy, unspecified: Secondary | ICD-10-CM

## 2020-03-05 DIAGNOSIS — Z6841 Body Mass Index (BMI) 40.0 and over, adult: Secondary | ICD-10-CM

## 2020-03-05 DIAGNOSIS — E1140 Type 2 diabetes mellitus with diabetic neuropathy, unspecified: Secondary | ICD-10-CM

## 2020-03-05 DIAGNOSIS — Z79899 Other long term (current) drug therapy: Secondary | ICD-10-CM

## 2020-03-05 DIAGNOSIS — E1169 Type 2 diabetes mellitus with other specified complication: Secondary | ICD-10-CM

## 2020-03-05 DIAGNOSIS — I10 Essential (primary) hypertension: Secondary | ICD-10-CM

## 2020-03-05 DIAGNOSIS — E118 Type 2 diabetes mellitus with unspecified complications: Secondary | ICD-10-CM

## 2020-03-05 LAB — GLUCOSE, CAPILLARY: Glucose-Capillary: 192 mg/dL — ABNORMAL HIGH (ref 70–99)

## 2020-03-05 LAB — POCT GLYCOSYLATED HEMOGLOBIN (HGB A1C): Hemoglobin A1C: 9.2 % — AB (ref 4.0–5.6)

## 2020-03-05 MED ORDER — JANUMET 50-1000 MG PO TABS: ORAL_TABLET | ORAL | 5 refills | Status: DC

## 2020-03-05 MED ORDER — CANAGLIFLOZIN 100 MG PO TABS: 100.0000 mg | ORAL_TABLET | Freq: Every day | ORAL | 2 refills | Status: DC

## 2020-03-05 MED ORDER — INSULIN DETEMIR 100 UNIT/ML ~~LOC~~ SOLN: 40.0000 [IU] | Freq: Every day | SUBCUTANEOUS | 11 refills | Status: DC

## 2020-03-05 MED FILL — JANUMET 50-1,000 MG TABLET: 50-1000 | 30 days supply | Qty: 30 | Fill #0

## 2020-03-05 MED FILL — INVOKANA 100 MG TABLET: 100 | 30 days supply | Qty: 30 | Fill #0

## 2020-03-05 MED FILL — LEVEMIR 100 UNITS/ML VIAL: 100 | 25 days supply | Qty: 10 | Fill #0

## 2020-03-05 NOTE — Assessment & Plan Note (Signed)
Patient has been on lisinopril 10 mg for almost a year now.  Her blood pressure was normotensive at 104/65.  She denies any symptoms of hypotension. -Continue lisinopril 10 mg daily

## 2020-03-05 NOTE — Progress Notes (Signed)
`    CC: T2DM folllow up  HPI:  [name] is a 44 y.o. with a hx as noted below who presents with a hx as noted below. Please refer to the problem based charting for details.    Past Medical History:  Diagnosis Date  . Arthritis    "beginnings in my hands" (05/26/2013)  . Diabetes mellitus without complication ([place]) 0000000  . GERD (gastroesophageal reflux disease)    otc meds prn  . Migraines, neuralgic    '~ 3/week" (05/26/2013) - otc meds prn  . Morbid obesity ([place])   . Osgood-Schlatter's disease of left knee    left knee  . PCOS (polycystic ovarian syndrome)   . Seasonal allergies   . Tooth pain 05/26/2013   "bottom right" (05/26/2013)  . Uterine fibroid    Review of Systems: All systems were reviewed and are otherwise negative unless mentioned in the HPI  Physical Exam: Vitals:   03/05/20 1531  BP: 104/65  Pulse: 98  Temp: 98.4 F (36.9 C)  TempSrc: Oral  SpO2: 99%  Weight: 280 lb (127 kg)   Physical Exam Constitutional:      General: She is not in acute distress.    Appearance: Normal appearance. She is obese. She is not toxic-appearing.  HENT:     Head: Normocephalic and atraumatic.  Cardiovascular:     Rate and Rhythm: Normal rate and regular rhythm.     Heart sounds: Normal heart sounds. No murmur. No friction rub. No gallop.   Pulmonary:     Effort: Pulmonary effort is normal. No respiratory distress.     Breath sounds: Normal breath sounds. No wheezing or rales.  Abdominal:     General: Abdomen is flat. Bowel sounds are normal.     Palpations: Abdomen is soft.  Neurological:     General: No focal deficit present.     Mental Status: She is alert and oriented to person, place, and time.  Psychiatric:        Mood and Affect: Mood normal.    Assessment & Plan:   See Encounters Tab for problem based charting.  Patient discussed with Dr. Rebeca Alert

## 2020-03-05 NOTE — Assessment & Plan Note (Signed)
Patient states she has had insurance authorization issues getting access to Lyrica.  She states that her neuropathy is starting to get a little bit worse and is going to start the paperwork to get authorization again. -Continue to follow-up with patient as needed

## 2020-03-05 NOTE — Assessment & Plan Note (Signed)
Patient is amendable to talking with a dietitian to discuss ongoing management of her diabetes and weight loss. -Referral to nutrition has been placed

## 2020-03-05 NOTE — Patient Instructions (Addendum)
Thank you for letting us in clinic today.  Below is a summary of what we discussed:  1.  Diabetes -Your hemoglobin A1c is stable at 9.2.  We want to lower this between 7 and 8 ultimately. -Start taking Invokana 1 tablet daily before breakfast.  -1 side effect of this medication are UTIs and yeast infections.  If you feel like you are having burning with urination and itchiness, please call us immediately so we can address it. -I have provided you a referral to a dietitian to further discuss diabetes management.  If you do not hear from them within the next 2 weeks, please feel free to reach out to [place].  2.  Follow up -Please follow-up with [place] in 3 months  If any questions or concerns in the meantime, please feel free to reach out to [place]

## 2020-03-05 NOTE — Addendum Note (Signed)
Addended by: Earlene Plater on: 03/05/2020 05:17 PM   Modules accepted: Orders

## 2020-03-05 NOTE — Assessment & Plan Note (Addendum)
Pt's A1c is stable at 9.2 from 3 months ago. Only taken Trulicty and Levemir right now. Has not been able to afford Trulicity due to insurance coverage.  Due to the patient's young age, I would like her goal A1c to be between 7 and 8.  Discussed starting SGL2 inhibitor with the patient, and she is in agreement with starting this medication.  She is aware of the potential side effect of UTIs and yeast infections. -Start Invokana 100 mg daily with breakfast -Continue Levemir 40 units nightly and daily Janumet 50-1000 mg daily

## 2020-03-12 ENCOUNTER — Other Ambulatory Visit: Payer: Self-pay | Admitting: Internal Medicine

## 2020-03-12 DIAGNOSIS — M25531 Pain in right wrist: Secondary | ICD-10-CM

## 2020-03-12 DIAGNOSIS — M542 Cervicalgia: Secondary | ICD-10-CM

## 2020-03-12 MED ORDER — CYCLOBENZAPRINE HCL 5 MG PO TABS: ORAL_TABLET | ORAL | 0 refills | Status: DC

## 2020-03-12 MED ORDER — MELOXICAM 15 MG PO TABS: 15.0000 mg | ORAL_TABLET | Freq: Every day | ORAL | 2 refills | Status: DC

## 2020-03-12 MED ORDER — PANTOPRAZOLE SODIUM 40 MG PO TBEC: 40.0000 mg | DELAYED_RELEASE_TABLET | Freq: Every day | ORAL | 1 refills | Status: DC

## 2020-03-12 MED FILL — CYCLOBENZAPRINE HCL 5 MG TA: 5 | 10 days supply | Qty: 30 | Fill #0

## 2020-03-12 MED FILL — ATORVASTATIN 20 MG TABLET: 20 | 30 days supply | Qty: 30 | Fill #6

## 2020-03-12 MED FILL — LISINOPRIL 10 MG TABS: 10 | 30 days supply | Qty: 30 | Fill #1

## 2020-03-12 MED FILL — MELOXICAM 15 MG TABLET: 15 | 30 days supply | Qty: 30 | Fill #0

## 2020-03-12 MED FILL — PANTOPRAZOLE SOD DR 40 MG T: 40 | 30 days supply | Qty: 30 | Fill #0

## 2020-03-12 NOTE — Addendum Note (Signed)
Addended by: Earlene Plater on: 03/12/2020 05:54 PM   Modules accepted: Orders

## 2020-03-12 NOTE — Addendum Note (Signed)
Addended by: Earlene Plater on: 03/12/2020 05:53 PM   Modules accepted: Orders

## 2020-03-13 NOTE — Progress Notes (Signed)
Internal Medicine Clinic Attending  Case discussed with Dr. Brylon Brenning at the time of the visit.  We reviewed the resident's history and exam and pertinent patient test results.  I agree with the assessment, diagnosis, and plan of care documented in the resident's note.  Youssef Footman, M.D., Ph.D.  

## 2020-03-15 ENCOUNTER — Other Ambulatory Visit: Payer: Self-pay | Admitting: *Deleted

## 2020-03-15 ENCOUNTER — Ambulatory Visit: Payer: Self-pay | Admitting: Dietician

## 2020-03-15 DIAGNOSIS — Z713 Dietary counseling and surveillance: Secondary | ICD-10-CM

## 2020-03-15 DIAGNOSIS — E1169 Type 2 diabetes mellitus with other specified complication: Secondary | ICD-10-CM

## 2020-03-15 DIAGNOSIS — G629 Polyneuropathy, unspecified: Secondary | ICD-10-CM

## 2020-03-15 MED ORDER — PREGABALIN 100 MG PO CAPS: 100.0000 mg | ORAL_CAPSULE | Freq: Three times a day (TID) | ORAL | 3 refills | Status: DC

## 2020-03-15 NOTE — Telephone Encounter (Signed)
Refill request for Lyrica-pt attempting to obtain medication from the [place] Patient Assistance Program and rx must be written by an Attending MD.  Will send refill request to attending pool for review-if approved, please send  electronically sent to [facility].Regenia Skeeter, Ersa Delaney Cassady3/25/20219:04 AM

## 2020-03-15 NOTE — Addendum Note (Signed)
Addended by: Jodean Lima on: 03/15/2020 10:06 AM   Modules accepted: Orders

## 2020-03-16 ENCOUNTER — Encounter: Payer: Self-pay | Admitting: Dietician

## 2020-03-16 NOTE — Progress Notes (Signed)
Diabetes Self-Management Education  Visit Type: First/Initial  Appt. Start Time: 1520 Appt. End Time: [id number]  03/16/2020  [name], identified by name and date of birth, is a 44 y.o. female with a diagnosis of Diabetes: Type 2.   ASSESSMENT   Patient reports lack of sleep partly due to nerve pain and also untreated sleep apnea.   We discusses nerve pain should subside with better diabetes control and importance of B12 intake.   Last menstrual period 07/06/2013. There is no height or weight on file to calculate BMI.  Lab Results  Component Value Date   HGBA1C 9.2 (A) 03/05/2020   HGBA1C 9.2 (A) 11/28/2019   HGBA1C 8.6 (A) 09/05/2019   HGBA1C 10.2 (A) 05/23/2019   HGBA1C 8.2 (A) 02/15/2019     Diabetes Self-Management Education - 03/16/20 1000      Visit Information   Visit Type  First/Initial      Initial Visit   Diabetes Type  Type 2    Are you currently following a meal plan?  No    Are you taking your medications as prescribed?  Yes    Date Diagnosed  --   2017     Psychosocial Assessment   Patient Belief/Attitude about Diabetes  Motivated to manage diabetes    Self-management support  Friends;Family;CDE visits    Patient Concerns  Nutrition/Meal planning;Glycemic Control    Preferred Learning Style  No preference indicated      Pre-Education Assessment   Patient understands the diabetes disease and treatment process.  Needs Instruction    Patient understands incorporating nutritional management into lifestyle.  Needs Instruction    Patient understands using medications safely.  Needs Instruction    Patient understands monitoring blood glucose, interpreting and using results  Needs Instruction    Patient understands prevention, detection, and treatment of acute complications.  Needs Instruction    Patient understands prevention, detection, and treatment of chronic complications.  Needs Instruction      Complications   How often do you check your blood  sugar?  1-2 times/day    Fasting Blood glucose range (mg/dL)  >200    Postprandial Blood glucose range (mg/dL)  >200    Number of hypoglycemic episodes per month  0    Number of hyperglycemic episodes per week  100      Dietary Intake   Breakfast  fruit and yogurt parfait    Snack (morning)  chips and salsa    Snack (afternoon)  3 sugar free chocl wafer    Dinner  out [employer]- [address]- 8 oz [employer], baked potato, bacon, butter, cheese, corn butter, rolls x4    Beverage(s)  zero sugar mt dew 3 cans/day, 12-14 bottles of water      Patient Education   Previous Diabetes Education  No    Nutrition management   Role of diet in the treatment of diabetes and the relationship between the three main macronutrients and blood glucose level;Food label reading, portion sizes and measuring food.;Carbohydrate counting;Information on hints to eating out and maintain blood glucose control.    Medications  Reviewed patients medication for diabetes, action, purpose, timing of dose and side effects.    Monitoring  Identified appropriate SMBG and/or A1C goals.    Acute complications  Discussed and identified patients' treatment of hyperglycemia.       Individualized Plan for Diabetes Self-Management Training:   Learning Objective:  Patient will have a greater understanding of diabetes self-management. Patient education  plan is to attend individual and/or group sessions per assessed needs and concerns.   Plan:   There are no Patient Instructions on file for this visit.  Expected Outcomes:     Education material provided: Diabetes Resources  If problems or questions, patient to contact team via:  Phone  Future DSME appointment:

## 2020-03-29 ENCOUNTER — Encounter: Payer: Self-pay | Admitting: Internal Medicine

## 2020-03-29 ENCOUNTER — Other Ambulatory Visit (HOSPITAL_COMMUNITY)
Admission: RE | Admit: 2020-03-29 | Discharge: 2020-03-29 | Disposition: A | Discharge status: Home / Self Care | Payer: Self-pay | Source: Ambulatory Visit | Attending: Student in an Organized Health Care Education/Training Program | Admitting: Student in an Organized Health Care Education/Training Program

## 2020-03-29 ENCOUNTER — Ambulatory Visit: Payer: Self-pay | Admitting: Internal Medicine

## 2020-03-29 VITALS — BP 132/77 | HR 110 | Temp 98.9°F | Ht 63.0 in | Wt 272.9 lb

## 2020-03-29 DIAGNOSIS — Z794 Long term (current) use of insulin: Secondary | ICD-10-CM

## 2020-03-29 DIAGNOSIS — Z87891 Personal history of nicotine dependence: Secondary | ICD-10-CM

## 2020-03-29 DIAGNOSIS — E119 Type 2 diabetes mellitus without complications: Secondary | ICD-10-CM

## 2020-03-29 DIAGNOSIS — B373 Candidiasis of vulva and vagina: Secondary | ICD-10-CM

## 2020-03-29 DIAGNOSIS — B3731 Acute candidiasis of vulva and vagina: Secondary | ICD-10-CM

## 2020-03-29 DIAGNOSIS — E1169 Type 2 diabetes mellitus with other specified complication: Secondary | ICD-10-CM

## 2020-03-29 MED ORDER — FLUCONAZOLE 150 MG PO TABS: 150.0000 mg | ORAL_TABLET | ORAL | 0 refills | Status: DC

## 2020-03-29 MED ORDER — METFORMIN HCL ER 500 MG PO TB24: 1000.0000 mg | ORAL_TABLET | Freq: Two times a day (BID) | ORAL | 2 refills | Status: DC

## 2020-03-29 MED ORDER — OZEMPIC (0.25 OR 0.5 MG/DOSE) 2 MG/1.5ML ~~LOC~~ SOPN: PEN_INJECTOR | SUBCUTANEOUS | 0 refills | Status: DC

## 2020-03-29 MED FILL — LEVEMIR 100 UNITS/ML VIAL: 100 | 25 days supply | Qty: 10 | Fill #1

## 2020-03-29 MED FILL — METFORMIN HCL ER 500 MG TB2: 500 | 30 days supply | Qty: 120 | Fill #0

## 2020-03-29 MED FILL — FLUCONAZOLE 150 MG TABLET: 150 | 9 days supply | Qty: 3 | Fill #0

## 2020-03-29 NOTE — Assessment & Plan Note (Signed)
2 weeks of vaginal burning, itching, increased urination, decreased sleep due to nocturia which began after starting invokana.  Long history of yeast infections but no history of UTI's.  She says symptoms are worse than with her prior yeast infections.  Urinalysis not suggestive of bacterial infection.  No systemic symptoms.  Vaginal exam and appearance of discharge consistent with vulvovaginal candidiasis.  Pt is not sexually active, in the past was sexually active with women only.    -stop invokana, switch to ozempic -wet prep -treat empirically with diflucan -attempt to achieve tighter blood glucose control see separate problem

## 2020-03-29 NOTE — Assessment & Plan Note (Addendum)
Poor glycemic control.  She is on levemir, janumet 1000mg  once daily.  Severe yeast infection shortly after being started on invokana.  No history of diabetic retinopathy  -stop invokana and janumet once daily -increase metformin to 1000mg  twice daily -start ozempic ramp up given sample today -medication assistance through the health department on securing ozempic going forward.   -she will self titrate levemir in small increments leading up to her next visit she has follow up with pcp already scheduled

## 2020-03-29 NOTE — Progress Notes (Signed)
CC: Vaginal burning, itching, T2DM  HPI:  [name] is a 44 y.o. female with PMH below.  Today we will address Vaginal burning, itching, T2DM   Please see A&P for status of the patient's chronic medical conditions  Past Medical History:  Diagnosis Date  . Arthritis    "beginnings in my hands" (05/26/2013)  . Diabetes mellitus without complication ([place]) 0000000  . GERD (gastroesophageal reflux disease)    otc meds prn  . Migraines, neuralgic    '~ 3/week" (05/26/2013) - otc meds prn  . Morbid obesity ([place])   . Osgood-Schlatter's disease of left knee    left knee  . PCOS (polycystic ovarian syndrome)   . Seasonal allergies   . Tooth pain 05/26/2013   "bottom right" (05/26/2013)  . Uterine fibroid    Review of Systems:  ROS: Pulmonary: pt denies increased work of breathing, shortness of breath,  Cardiac: pt denies palpitations, chest pain,  Abdominal: pt denies abdominal pain, nausea, vomiting, or diarrhea   Physical Exam:  Vitals:   03/29/20 1445  BP: 132/77  Pulse: (!) 110  Temp: 98.9 F (37.2 C)  TempSrc: Oral  SpO2: 99%  Weight: 272 lb 14.4 oz (123.8 kg)  Height: 5\' 3"  (1.6 m)   Cardiac:  normal rate and rhythm, clear s1 and s2, no murmurs, rubs or gallops Pulmonary: CTAB, not in distress GU: erythematous vaginal wall and labia, curd like white vaginal discharge present in vaginal vault   Social History   Socioeconomic History  . Marital status: Single    Spouse name: Not on file  . Number of children: Not on file  . Years of education: Not on file  . Highest education level: Not on file  Occupational History  . Occupation: Seamstress  Tobacco Use  . Smoking status: Former Smoker    Packs/day: 1.00    Years: 21.00    Pack years: 21.00    Types: E-cigarettes    Quit date: 07/08/2013    Years since quitting: 6.7  . Smokeless tobacco: Former Systems developer    Quit date: 07/01/2013  Substance and Sexual Activity  . Alcohol use: No  . Drug use: No  .  Sexual activity: Not Currently    Birth control/protection: Surgical  Other Topics Concern  . Not on file  Social History Narrative   Lives with partner.    Social Determinants of Health   Financial Resource Strain:   . Difficulty of Paying Living Expenses:   Food Insecurity:   . Worried About Charity fundraiser in the Last Year:   . Arboriculturist in the Last Year:   Transportation Needs:   . Film/video editor (Medical):   Marland Kitchen Lack of Transportation (Non-Medical):   Physical Activity:   . Days of Exercise per Week:   . Minutes of Exercise per Session:   Stress:   . Feeling of Stress :   Social Connections:   . Frequency of Communication with Friends and Family:   . Frequency of Social Gatherings with Friends and Family:   . Attends Religious Services:   . Active Member of Clubs or Organizations:   . Attends Archivist Meetings:   Marland Kitchen Marital Status:   Intimate Partner Violence:   . Fear of Current or Ex-Partner:   . Emotionally Abused:   Marland Kitchen Physically Abused:   . Sexually Abused:     Family History  Problem Relation Age of Onset  . Fibromyalgia Mother   .  Cancer Mother   . Hypertension Mother   . Diabetes Mother   . Diabetes Brother   . Hyperlipidemia Brother   . Cancer Maternal Aunt        breast cancer    Assessment & Plan:   See Encounters Tab for problem based charting.  Patient discussed with Dr. Daryll Drown

## 2020-03-29 NOTE — Patient Instructions (Addendum)
We will start you on ozempic discontinue the invokana and janumet and switch you to metformin.  I have prescribed diflucan for your yeast infection.  Please adjust your levemir as we discussed.

## 2020-03-29 NOTE — Addendum Note (Signed)
Addended by: Guadlupe Spanish B on: 03/29/2020 04:03 PM   Modules accepted: Orders

## 2020-03-30 ENCOUNTER — Telehealth: Payer: Self-pay | Admitting: Internal Medicine

## 2020-03-30 LAB — CERVICOVAGINAL ANCILLARY ONLY
Candida Glabrata: NEGATIVE
Candida Vaginitis: POSITIVE — AB
Comment: NEGATIVE
Comment: NEGATIVE

## 2020-03-30 NOTE — Telephone Encounter (Signed)
Left message for pt regarding wet prep results.  No changes to treatment plan

## 2020-04-05 NOTE — Progress Notes (Signed)
Internal Medicine Clinic Attending  Case discussed with Dr. Winfrey  at the time of the visit.  We reviewed the resident's history and exam and pertinent patient test results.  I agree with the assessment, diagnosis, and plan of care documented in the resident's note.  

## 2020-04-12 MED FILL — ATORVASTATIN 20 MG TABLET: 20 | 30 days supply | Qty: 30 | Fill #7

## 2020-04-12 MED FILL — PANTOPRAZOLE SOD DR 40 MG T: 40 | 30 days supply | Qty: 30 | Fill #1

## 2020-04-12 MED FILL — MELOXICAM 15 MG TABLET: 15 | 30 days supply | Qty: 30 | Fill #1

## 2020-04-12 MED FILL — LISINOPRIL 10 MG TABS: 10 | 30 days supply | Qty: 30 | Fill #2

## 2020-04-12 MED FILL — LEVEMIR 100 UNITS/ML VIAL: 100 | 25 days supply | Qty: 10 | Fill #2

## 2020-04-26 ENCOUNTER — Telehealth: Payer: Self-pay | Admitting: Internal Medicine

## 2020-04-26 ENCOUNTER — Ambulatory Visit: Payer: Self-pay | Admitting: Internal Medicine

## 2020-04-26 ENCOUNTER — Other Ambulatory Visit: Payer: Self-pay

## 2020-04-26 DIAGNOSIS — R197 Diarrhea, unspecified: Secondary | ICD-10-CM | POA: Insufficient documentation

## 2020-04-26 NOTE — Assessment & Plan Note (Signed)
Patient states that she started having diarrhea last Sunday 2 weeks ago.  Few days later she also noted nausea, vomiting that was episodic.  Feels that the vomiting has subsided but she continues to have diarrhea.  She has watery nonbloody bowel movement most every 15-20 minutes.  Denies any fevers or chills.  Has accompanied abdominal pain upper abdomen.  She states that she has might have had similar type symptoms but not as intense prior when she ate greasy or fatty foods.    Assessment and plan Patient's GI symptoms are likely secondary to recent medication change of starting Ozempic.  Will instruct the patient to stop semaglutide and note whether she has any provement after this changes made.  Of note, the patient has not had her Covid vaccine and has been continue to work.  GI type symptoms have been associated with COVID-19 infection.  However, due to the low pretest probability at this time will not test her.  If patient symptoms persist she will need to start a food diary, get stool osmolar gap, stool culture testing.  -Stop Ozempic -Follow-up in 1 week to check for symptom resolution and to adjust antihyper glycemic medications

## 2020-04-26 NOTE — Telephone Encounter (Signed)
pls contact pt [phone]

## 2020-04-26 NOTE — Telephone Encounter (Signed)
Severe diarrhea, N&V cant sleep and cannot go to work.

## 2020-04-26 NOTE — Telephone Encounter (Signed)
appt at 1045 [facility]

## 2020-04-26 NOTE — Patient Instructions (Signed)
It was a pleasure to see you today Ms. Debord. Please make the following changes:  I am sorry that you are having diarrhea: Please stop ozembic and monitor for improvement in your symptoms  Follow up in 1 week for [place] to re-evaluate your glucose readings  Please call us if your symptoms worsen  If you have any questions or concerns, please call our clinic at [phone] between 9am-5pm and after hours call [phone] and ask for the internal medicine resident on call. If you feel you are having a medical emergency please call 911.   Thank you, we look forward to help you remain healthy!  Lars Mage, MD Internal Medicine PGY3

## 2020-04-26 NOTE — Telephone Encounter (Signed)
Please have patient come in for appointment

## 2020-04-26 NOTE — Progress Notes (Signed)
   CC: Nausea, vomiting, diarrhea  HPI:  [name] is a 44 y.o. with diabetes mellitus type 2, hypertension, obesity who presents for nausea, vomiting, diarrhea. Please see problem based charting for evaluation, assessment, and plan.  Past Medical History:  Diagnosis Date  . Arthritis    "beginnings in my hands" (05/26/2013)  . Diabetes mellitus without complication ([place]) 0000000  . GERD (gastroesophageal reflux disease)    otc meds prn  . Migraines, neuralgic    '~ 3/week" (05/26/2013) - otc meds prn  . Morbid obesity ([place])   . Osgood-Schlatter's disease of left knee    left knee  . PCOS (polycystic ovarian syndrome)   . Seasonal allergies   . Tooth pain 05/26/2013   "bottom right" (05/26/2013)  . Uterine fibroid    Review of Systems:  Review of Systems  Constitutional: Negative for chills and fever.  Gastrointestinal: Positive for abdominal pain, diarrhea, nausea and vomiting.  Neurological: Negative for dizziness.    Physical Exam:  Vitals:   04/26/20 1107  BP: 109/75  Pulse: 97  Temp: 98.2 F (36.8 C)  TempSrc: Oral  SpO2: 99%  Weight: 271 lb 3.2 oz (123 kg)  Height: 5\' 3"  (1.6 m)   Physical Exam  Constitutional: She is oriented to person, place, and time. She appears well-developed and well-nourished. No distress.  HENT:  Head: Normocephalic and atraumatic.  Eyes: Conjunctivae are normal.  Cardiovascular: Normal rate, regular rhythm and normal heart sounds.  Respiratory: Effort normal and breath sounds normal. No respiratory distress. She has no wheezes.  GI: Soft. Bowel sounds are normal. She exhibits no distension. There is abdominal tenderness (upper abdomen).  Neurological: She is alert and oriented to person, place, and time.  Skin: She is not diaphoretic.   Assessment & Plan:   See Encounters Tab for problem based charting.  Patient discussed with Dr. Dareen Piano

## 2020-04-27 NOTE — Progress Notes (Signed)
Internal Medicine Clinic Attending  Case discussed with Dr. Chundi at the time of the visit.  We reviewed the resident's history and exam and pertinent patient test results.  I agree with the assessment, diagnosis, and plan of care documented in the resident's note. 

## 2020-04-30 ENCOUNTER — Encounter: Payer: Self-pay | Admitting: *Deleted

## 2020-04-30 NOTE — Addendum Note (Signed)
Addended by: Lars Mage on: 04/30/2020 10:02 AM   Modules accepted: Level of Service

## 2020-05-07 ENCOUNTER — Encounter: Payer: Self-pay | Admitting: Internal Medicine

## 2020-05-07 NOTE — Progress Notes (Deleted)
   CC: Diarrhea follow up  HPI:  [name] is a 44 y.o. F with a hx as noted below who presents today for diarrhea follow up. Please refer to the problem based charting for further details.   Pt has been experiencing diarrhea for the last several weeks. Was last seen in clinic 1 week ago and was instructed to D/c Semaglutide which was started originally on 4/8.  Potentially start a food diary, get stool osmolar gap, stool culture testing.  Past Medical History:  Diagnosis Date  . Arthritis    "beginnings in my hands" (05/26/2013)  . Diabetes mellitus without complication ([place]) 0000000  . GERD (gastroesophageal reflux disease)    otc meds prn  . Migraines, neuralgic    '~ 3/week" (05/26/2013) - otc meds prn  . Morbid obesity ([place])   . Osgood-Schlatter's disease of left knee    left knee  . PCOS (polycystic ovarian syndrome)   . Seasonal allergies   . Tooth pain 05/26/2013   "bottom right" (05/26/2013)  . Uterine fibroid    Review of Systems:  All systems reviewed and are negative unless mentioned int he HPI.  Physical Exam:  There were no vitals filed for this visit.  Physical Exam  Assessment & Plan:   See Encounters Tab for problem based charting.  Patient discussed with Dr. {NAMES:[id number]::"Butcher","Guilloud","Hoffman","Mullen","Narendra","Raines","Vincent"}

## 2020-05-08 MED FILL — LISINOPRIL 10 MG TABS: 10 | 30 days supply | Qty: 30 | Fill #3

## 2020-05-08 MED FILL — ATORVASTATIN 20 MG TABLET: 20 | 30 days supply | Qty: 30 | Fill #8

## 2020-05-08 MED FILL — PANTOPRAZOLE SOD DR 40 MG T: 40 | 30 days supply | Qty: 30 | Fill #2

## 2020-05-08 MED FILL — MELOXICAM 15 MG TABLET: 15 | 30 days supply | Qty: 30 | Fill #2

## 2020-05-08 MED FILL — LEVEMIR 100 UNITS/ML VIAL: 100 | 25 days supply | Qty: 10 | Fill #3

## 2020-06-04 ENCOUNTER — Other Ambulatory Visit: Payer: Self-pay | Admitting: Internal Medicine

## 2020-06-04 DIAGNOSIS — M25532 Pain in left wrist: Secondary | ICD-10-CM

## 2020-06-04 MED FILL — PANTOPRAZOLE SOD DR 40 MG T: 40 | 30 days supply | Qty: 30 | Fill #3

## 2020-06-04 MED FILL — METFORMIN HCL ER 500 MG TB2: 500 | 30 days supply | Qty: 120 | Fill #2

## 2020-06-04 MED FILL — LISINOPRIL 10 MG TABS: 10 | 30 days supply | Qty: 30 | Fill #4

## 2020-06-04 MED FILL — ATORVASTATIN 20 MG TABLET: 20 | 30 days supply | Qty: 30 | Fill #9

## 2020-06-04 NOTE — Telephone Encounter (Signed)
I can address this at the patient's visit with me tomorrow.

## 2020-06-05 ENCOUNTER — Other Ambulatory Visit: Payer: Self-pay

## 2020-06-05 ENCOUNTER — Encounter: Payer: Self-pay | Admitting: Internal Medicine

## 2020-06-05 ENCOUNTER — Ambulatory Visit: Payer: Self-pay | Admitting: Internal Medicine

## 2020-06-05 VITALS — BP 115/77 | HR 107 | Temp 98.3°F | Ht 63.0 in | Wt 272.3 lb

## 2020-06-05 DIAGNOSIS — F17290 Nicotine dependence, other tobacco product, uncomplicated: Secondary | ICD-10-CM

## 2020-06-05 DIAGNOSIS — N951 Menopausal and female climacteric states: Secondary | ICD-10-CM

## 2020-06-05 DIAGNOSIS — F17299 Nicotine dependence, other tobacco product, with unspecified nicotine-induced disorders: Secondary | ICD-10-CM

## 2020-06-05 DIAGNOSIS — M25521 Pain in right elbow: Secondary | ICD-10-CM

## 2020-06-05 DIAGNOSIS — M25531 Pain in right wrist: Secondary | ICD-10-CM

## 2020-06-05 DIAGNOSIS — M25532 Pain in left wrist: Secondary | ICD-10-CM

## 2020-06-05 DIAGNOSIS — Z794 Long term (current) use of insulin: Secondary | ICD-10-CM

## 2020-06-05 DIAGNOSIS — E1169 Type 2 diabetes mellitus with other specified complication: Secondary | ICD-10-CM

## 2020-06-05 DIAGNOSIS — Z7984 Long term (current) use of oral hypoglycemic drugs: Secondary | ICD-10-CM

## 2020-06-05 DIAGNOSIS — Z789 Other specified health status: Secondary | ICD-10-CM

## 2020-06-05 DIAGNOSIS — G8929 Other chronic pain: Secondary | ICD-10-CM

## 2020-06-05 LAB — GLUCOSE, CAPILLARY: Glucose-Capillary: 302 mg/dL — ABNORMAL HIGH (ref 70–99)

## 2020-06-05 LAB — POCT GLYCOSYLATED HEMOGLOBIN (HGB A1C): Hemoglobin A1C: 9.1 % — AB (ref 4.0–5.6)

## 2020-06-05 MED ORDER — MELOXICAM 15 MG PO TABS: 15.0000 mg | ORAL_TABLET | Freq: Every day | ORAL | 2 refills | Status: DC

## 2020-06-05 MED ORDER — INSULIN DETEMIR 100 UNIT/ML ~~LOC~~ SOLN: 40.0000 [IU] | Freq: Every day | SUBCUTANEOUS | 11 refills | Status: DC

## 2020-06-05 MED ORDER — NOVOLOG FLEXPEN 100 UNIT/ML ~~LOC~~ SOPN: 10.0000 [IU] | PEN_INJECTOR | Freq: Three times a day (TID) | SUBCUTANEOUS | 2 refills | Status: DC

## 2020-06-05 MED FILL — NOVOLOG FLEXPEN SYRINGE: 100 | 30 days supply | Qty: 9 | Fill #0

## 2020-06-05 MED FILL — UNIFINE PENTIPS 32GX5/32: 32G X 4 MM | 30 days supply | Qty: 100 | Fill #0

## 2020-06-05 MED FILL — MELOXICAM 15 MG TABLET: 15 | 30 days supply | Qty: 30 | Fill #0

## 2020-06-05 NOTE — Progress Notes (Signed)
   CC: T2DM f/u  HPI:  [name] is a 44 y.o. with a hx as noted below who presents for T2DM follow up. Please refer to the problem based charting for further details.  COVID vaccine - has not received yet Pap smear - Hysterectomy 2008 Mammogram - Overdue  Past Medical History:  Diagnosis Date  . Arthritis    "beginnings in my hands" (05/26/2013)  . Diabetes mellitus without complication ([place]) 8416  . GERD (gastroesophageal reflux disease)    otc meds prn  . Migraines, neuralgic    '~ 3/week" (05/26/2013) - otc meds prn  . Morbid obesity ([place])   . Osgood-Schlatter's disease of left knee    left knee  . PCOS (polycystic ovarian syndrome)   . Seasonal allergies   . Tooth pain 05/26/2013   "bottom right" (05/26/2013)  . Uterine fibroid    Review of Systems:  Negative unless mentioned in the HPI.  Physical Exam: Vitals:   06/05/20 1548  BP: 115/77  Pulse: (!) 107  SpO2: 99%  Weight: 272 lb 4.8 oz (123.5 kg)   Physical Exam Constitutional:      General: She is not in acute distress.    Appearance: Normal appearance. She is obese. She is not ill-appearing, toxic-appearing or diaphoretic.  HENT:     Head: Normocephalic and atraumatic.  Eyes:     Extraocular Movements: Extraocular movements intact.  Cardiovascular:     Rate and Rhythm: Normal rate and regular rhythm.     Pulses: Normal pulses.     Heart sounds: No murmur heard.  No friction rub. No gallop.   Pulmonary:     Effort: Pulmonary effort is normal. No respiratory distress.     Breath sounds: Normal breath sounds. No wheezing or rales.  Abdominal:     General: Abdomen is flat. Bowel sounds are normal. There is no distension.     Palpations: Abdomen is soft.     Tenderness: There is no abdominal tenderness. There is no guarding.  Musculoskeletal:     Right lower leg: No edema.     Left lower leg: No edema.  Skin:    General: Skin is warm.  Neurological:     General: No focal deficit present.      Mental Status: She is alert and oriented to person, place, and time.  Psychiatric:        Mood and Affect: Mood normal.    Assessment & Plan:   See Encounters Tab for problem based charting.  Patient discussed with Dr. Philipp Ovens

## 2020-06-05 NOTE — Assessment & Plan Note (Signed)
Patient has a history of osteoarthritis in multiple joints and works as a Regulatory affairs officer.  She says that the meloxicam helps tremendously and is in need of a refill.  Her last BMP showed normal kidney function.  Plan: -Continue meloxicam 15 mg daily as needed

## 2020-06-05 NOTE — Assessment & Plan Note (Signed)
The patient used to smoke cigarettes but now is using e-cigarettes.  She states that she decreased her nicotine consumption to 6 mg daily from 12 mg daily.  Her plan is to go to 3 mg daily then stop entirely.  Plan: -Encourage smoking cessation

## 2020-06-05 NOTE — Patient Instructions (Addendum)
Thank you for visiting [place] in clinic today. Below is a summary of what we discussed:  1. Diabetes -Start taking Novolog 10 units three times daily with your meals -Start taking Levemir 40 units daily -Continue taking Metformin 1000 mg twice a day -check your blood sugars 4 times a day -Please bring your glucose meter to the next diabetes visit  2. Hot flashes -We need to check your cholesterol levels prior to prescribing estrogen therapy -We can discuss this more at your next visit when the blood tests come back  3. Follow up -Please see [place] again on Monday afternoon so we can inject your elbow with steroids  If you have any questions or concerns in the meantime, please feel free to call us.

## 2020-06-05 NOTE — Assessment & Plan Note (Addendum)
Patient has a history of traumatic injury to her right elbow as a child and experiences chronic pain as a result of it.  The patient had a corticosteroid injection a year ago in our clinic and had 6 months of relief.  The pain has returned, and the patient feels like her range of motion is impaired.  The patient has been using over-the-counter Aspercreme and Voltaren gel without significant relief.  On exam, the patient is unable to fully extend her right elbow and has mild tenderness to palpation.  No obvious deformity, erythema or effusion.  I do think it is reasonable to consider his corticosteroid injection in the elbow, given the fact that the patient got 100% symptomatic relief for 6 months.  No indication for elbow imaging at this time as this would not change our management.  Plan: -Return on Monday afternoon to get a corticosteroid injection with Dr. Heber Riverside

## 2020-06-05 NOTE — Assessment & Plan Note (Signed)
Patient states that she is been seeing blood sugars in the upper 200s and low 300s for the past 2 to 3 weeks.  I last saw the patient 3 months ago, and started her on Invokana in addition to her long-acting insulin.  Unfortunately, the patient developed a yeast infection.  This was stopped and she was switched to Ozempic.  The patient started having diarrhea after this change as well.  Now, she is only on Levemir 50 units nightly and Metformin 1000 mg twice daily.  Today, the patient's A1c is 9.2 from 9.1 three months ago.  I believe we have much room for improvement on her A1c control.  Plan: -Levemir 40 units nightly -NovoLog 10 units 3 times daily with meals -Continue Metformin 1000 mg twice daily -Check blood sugars 4 times a day -Bring glucometer to next diabetes follow-up -Recheck A1c in 3 months

## 2020-06-05 NOTE — Assessment & Plan Note (Signed)
Patient states that she has been experiencing hot flashes for the last several months.  She states that they are very intense and keep her awake at night.  They have gotten so severe that she has had to stand in front of the fridge with the door open to get cool air.  Patient had a hysterectomy for uterine fibroids in 2008.  We discussed potentially starting estrogen pills and the patient states that she would like this.  However, patient does have a family history of MI in her father, and stroke in her mother.  A thorough cardiovascular risk assessment needs to be performed prior to administration of estrogen replacement therapy.  Plan: -Lipid panel ordered -Smoking cessation counseling -Discuss at next visit

## 2020-06-06 LAB — LIPID PANEL
Chol/HDL Ratio: 3.3 ratio (ref 0.0–4.4)
Cholesterol, Total: 143 mg/dL (ref 100–199)
HDL: 43 mg/dL (ref >39)
LDL Chol Calc (NIH): 67 mg/dL (ref 0–99)
Triglycerides: 199 mg/dL — ABNORMAL HIGH (ref 0–149)
VLDL Cholesterol Cal: 33 mg/dL (ref 5–40)

## 2020-06-06 LAB — MICROALBUMIN / CREATININE URINE RATIO
Creatinine, Urine: 136.1 mg/dL
Microalb/Creat Ratio: 7 mg/g creat (ref 0–29)
Microalbumin, Urine: 9.4 ug/mL

## 2020-06-06 NOTE — Progress Notes (Signed)
Internal Medicine Clinic Attending  Case discussed with Dr. Alexander at the time of the visit.  We reviewed the resident's history and exam and pertinent patient test results.  I agree with the assessment, diagnosis, and plan of care documented in the resident's note.  

## 2020-06-19 ENCOUNTER — Ambulatory Visit: Payer: Self-pay

## 2020-06-25 MED FILL — LEVEMIR 100 UNITS/ML VIAL: 100 | 25 days supply | Qty: 10 | Fill #5

## 2020-07-03 ENCOUNTER — Telehealth: Payer: Self-pay | Admitting: Student

## 2020-07-03 NOTE — Telephone Encounter (Signed)
Please disregard hydrocodone it was on another pt. appt is set for pt for 7/14 at 1015

## 2020-07-03 NOTE — Telephone Encounter (Signed)
Pls contact [phone]; pt is needing some stronger medicine

## 2020-07-03 NOTE — Telephone Encounter (Signed)
Pt would also like hydrocodone refilled but cannot come to an appt due to transportation [phone]

## 2020-07-04 ENCOUNTER — Other Ambulatory Visit: Payer: Self-pay | Admitting: Internal Medicine

## 2020-07-04 ENCOUNTER — Ambulatory Visit: Payer: Self-pay | Admitting: Internal Medicine

## 2020-07-04 ENCOUNTER — Encounter: Payer: Self-pay | Admitting: Internal Medicine

## 2020-07-04 ENCOUNTER — Other Ambulatory Visit: Payer: Self-pay

## 2020-07-04 VITALS — BP 100/57 | HR 98 | Temp 98.0°F | Ht 63.0 in | Wt 271.3 lb

## 2020-07-04 DIAGNOSIS — K029 Dental caries, unspecified: Secondary | ICD-10-CM

## 2020-07-04 DIAGNOSIS — I10 Essential (primary) hypertension: Secondary | ICD-10-CM

## 2020-07-04 DIAGNOSIS — E119 Type 2 diabetes mellitus without complications: Secondary | ICD-10-CM

## 2020-07-04 DIAGNOSIS — J0100 Acute maxillary sinusitis, unspecified: Secondary | ICD-10-CM

## 2020-07-04 DIAGNOSIS — E1169 Type 2 diabetes mellitus with other specified complication: Secondary | ICD-10-CM

## 2020-07-04 DIAGNOSIS — Z794 Long term (current) use of insulin: Secondary | ICD-10-CM

## 2020-07-04 MED ORDER — AMOXICILLIN-POT CLAVULANATE 875-125 MG PO TABS: 1.0000 | ORAL_TABLET | Freq: Two times a day (BID) | ORAL | 0 refills | Status: AC

## 2020-07-04 MED FILL — MELOXICAM 15 MG TABLET: 15 | 30 days supply | Qty: 30 | Fill #1

## 2020-07-04 MED FILL — PANTOPRAZOLE SOD DR 40 MG T: 40 | 30 days supply | Qty: 30 | Fill #4

## 2020-07-04 MED FILL — AMOX-CLAV 875-125 MG TABLET: 875-125 | 5 days supply | Qty: 10 | Fill #0

## 2020-07-04 MED FILL — NOVOLOG FLEXPEN SYRINGE: 100 | 30 days supply | Qty: 9 | Fill #1

## 2020-07-04 MED FILL — LISINOPRIL 10 MG TABS: 10 | 30 days supply | Qty: 30 | Fill #0

## 2020-07-04 MED FILL — UNIFINE PENTIPS 32GX5/32: 32G X 4 MM | 30 days supply | Qty: 100 | Fill #1

## 2020-07-04 MED FILL — ATORVASTATIN 20 MG TABLET: 20 | 30 days supply | Qty: 30 | Fill #10

## 2020-07-04 MED FILL — METFORMIN HCL ER 500 MG TB2: 500 | 30 days supply | Qty: 120 | Fill #0

## 2020-07-04 NOTE — Patient Instructions (Addendum)
[name],  It was a pleasure seeing you in clinic. Today we discussed:   Sinusitis: I am ordering a CT Maxillofacial to get a better look at your sinuses. I have also sent a prescription for Augmentin twice daily for 5 days. Please contact us if you continue having symptoms.  Diabetes: Please continue to take your metformin, levemir and lantus as prescribed. Follow up in August with your PCP.   If you have any questions or concerns, please call our clinic at [phone] between 9am-5pm and after hours call [phone] and ask for the internal medicine resident on call. If you feel you are having a medical emergency please call 911.   Thank you, we look forward to helping you remain healthy!

## 2020-07-06 ENCOUNTER — Telehealth: Payer: Self-pay | Admitting: Dietician

## 2020-07-06 DIAGNOSIS — J0100 Acute maxillary sinusitis, unspecified: Secondary | ICD-10-CM | POA: Insufficient documentation

## 2020-07-06 HISTORY — DX: Acute maxillary sinusitis, unspecified: J01.00

## 2020-07-06 NOTE — Assessment & Plan Note (Signed)
She has multiple fragmented and missing teeth on examination. No signs of active infection or abscess formation at this time.  Plan: F/u with dentist

## 2020-07-06 NOTE — Telephone Encounter (Signed)
Called patient per request of Dr. Marva Panda to discuss using a vgo as a way to administer her insulin. She liked the idea and thinks it is affordable for her. Suggest starting her on [email or web address]. She made an appointment for Tuesday to learn how to use it. We have a sample we can give her at that time.

## 2020-07-06 NOTE — Assessment & Plan Note (Signed)
Well controlled with lisinopril 10mg  daily.  BP Readings from Last 3 Encounters:  07/04/20 (!) 100/57  06/05/20 115/77  04/26/20 109/75   Plan: Continue lisinopril 10mg  daily

## 2020-07-06 NOTE — Progress Notes (Signed)
   CC: sinus tenderness and congestion  HPI:  [name] is a 44 y.o. female with PMHx as listed below presenting for concerns of sinus tenderness and congestion that has been present for approximately one week. She has tried nasal irrigation and over the counter medications including Suda-fed without significant relief. She denies any fevers/chills. Please see problem based charting for complete assessment and plan.  Past Medical History:  Diagnosis Date  . Arthritis    "beginnings in my hands" (05/26/2013)  . Diabetes mellitus without complication ([place]) 3838  . GERD (gastroesophageal reflux disease)    otc meds prn  . Migraines, neuralgic    '~ 3/week" (05/26/2013) - otc meds prn  . Morbid obesity ([place])   . Osgood-Schlatter's disease of left knee    left knee  . PCOS (polycystic ovarian syndrome)   . Seasonal allergies   . Tooth pain 05/26/2013   "bottom right" (05/26/2013)  . Uterine fibroid    Review of Systems:  Negative except as stated in HPI.  Physical Exam:  Vitals:   07/04/20 1004  BP: (!) 100/57  Pulse: 98  Temp: 98 F (36.7 C)  TempSrc: Oral  SpO2: 100%  Weight: 271 lb 4.8 oz (123.1 kg)  Height: 5\' 3"  (1.6 m)   Physical Exam  Constitutional: Appears well-developed and well-nourished. No distress.  HENT: Normocephalic and atraumatic, EOMI, conjunctiva normal, moist mucous membranes Tenderness to palpation of the right maxillary sinus, preauricular lymph node and mandibular lymph nodes on right. Multiple dental caries without signs of oral abscess noted.  Cardiovascular: Normal rate, regular rhythm, S1 and S2 present, no murmurs, rubs, gallops.  Distal pulses intact Respiratory: No respiratory distress, no accessory muscle use.  Effort is normal.  Lungs are clear to auscultation bilaterally. GI: Nondistended, soft, nontender to palpation Musculoskeletal: Normal bulk and tone.  No peripheral edema noted. Neurological: Is alert and oriented x4, no apparent  focal deficits noted. Skin: Warm and dry.  No rash, erythema, lesions noted.   Assessment & Plan:   See Encounters Tab for problem based charting.  Patient discussed with Dr. Dareen Piano

## 2020-07-06 NOTE — Assessment & Plan Note (Signed)
Patient reports approximately one week history of nasal congestion and sinus pain. She has been trying conservative management with nasal irrigation and over the counter medications including Mucinex and Suda-fed without much relief. She had a telehealth visit with an outside provider on 7/11 and was prescribed doxycycline. She notes that this did not provide any significant relief and symptoms are persistent and causing severe distress. She denies any fevers or chills or recent sick contacts.  On examination, severe frontal sinus and right maxillary sinus tenderness extending to the preauricular and right mandibular lymph nodes. She has dental caries but no abscess or active infection noted at this time. Given persistence of symptoms, and history of uncontrolled diabetes with physical exam findings, would benefit from getting further imaging.  Plan: Augmentin 875-125 bid x5 days  CT Maxillofacial wo contrast

## 2020-07-06 NOTE — Assessment & Plan Note (Addendum)
Most recent HbA1c 9.1 in June 2021. She is on Metformin 1000mg  bid, Levemir 40U daily and Novolog 10U tid. She notes that she has been compliant with the metformin and levemir; however, due to her work schedule, she often misses doses of her Novolog. She notes that it is difficult for her to get up from work station and go to the break room to get her Novolog during meal time and she often forgets to do so. She reports her blood sugar readings are often in the 200's. Advised patient to get minifridge to put at work station for her [place]. Patient may also benefit from VGo if continuously forgetting meal time insulin. She does follow with Butch Penny for diabetes education.   Plan: Continue current regimen HbA1c at next visit

## 2020-07-06 NOTE — Telephone Encounter (Signed)
Thank you for your help.

## 2020-07-09 NOTE — Progress Notes (Signed)
Internal Medicine Clinic Attending ° °Case discussed with Dr. Aslam  At the time of the visit.  We reviewed the resident’s history and exam and pertinent patient test results.  I agree with the assessment, diagnosis, and plan of care documented in the resident’s note.  °

## 2020-07-09 NOTE — Telephone Encounter (Addendum)
Called patient to inform her that I found out V-Go coupon cannot be used  for uninsured. [name] says without that it is not affordable. Therefore her appointment with me was cancelled. We brainstormed other ways to help her attain better blood sugar control. Educated her about storage of insulin.  (The pen/vial in use does not need to be kept in the refrigerator)   She feels she needs increased insulin doses and asked for more insulin because she often runs out. She would like to increase her Levemir to 50 units daily and her Novolog to  15-20 units 3x/day. She feels she knows how to do this safely and reports when she was on these doses her blood sugars were better controlled.  She'll need 20 mL Levemir for vials and 15 mL if using pens and 30 mL of Novolog each month to not run out too soon.

## 2020-07-10 ENCOUNTER — Ambulatory Visit: Payer: Self-pay | Admitting: Dietician

## 2020-07-11 NOTE — Telephone Encounter (Signed)
Thank you Butch Penny, will have patient schedule Telehealth to discuss insulin dosing

## 2020-07-12 NOTE — Telephone Encounter (Signed)
Spoke with the pt.  She has sch a  telehealth f/u appt  for tomorrow 07/13/2020 @ 9:15 am.

## 2020-07-13 ENCOUNTER — Ambulatory Visit (INDEPENDENT_AMBULATORY_CARE_PROVIDER_SITE_OTHER): Payer: Self-pay | Admitting: Internal Medicine

## 2020-07-13 ENCOUNTER — Other Ambulatory Visit: Payer: Self-pay

## 2020-07-13 DIAGNOSIS — B3731 Acute candidiasis of vulva and vagina: Secondary | ICD-10-CM

## 2020-07-13 DIAGNOSIS — E1169 Type 2 diabetes mellitus with other specified complication: Secondary | ICD-10-CM

## 2020-07-13 DIAGNOSIS — B373 Candidiasis of vulva and vagina: Secondary | ICD-10-CM

## 2020-07-13 DIAGNOSIS — J0100 Acute maxillary sinusitis, unspecified: Secondary | ICD-10-CM

## 2020-07-13 MED ORDER — INSULIN DETEMIR 100 UNIT/ML ~~LOC~~ SOLN: 50.0000 [IU] | Freq: Every day | SUBCUTANEOUS | 11 refills | Status: DC

## 2020-07-13 MED ORDER — FLUCONAZOLE 150 MG PO TABS: 150.0000 mg | ORAL_TABLET | ORAL | 0 refills | Status: DC

## 2020-07-13 MED ORDER — NOVOLOG FLEXPEN 100 UNIT/ML ~~LOC~~ SOPN: 20.0000 [IU] | PEN_INJECTOR | Freq: Three times a day (TID) | SUBCUTANEOUS | 5 refills | Status: DC

## 2020-07-13 NOTE — Progress Notes (Signed)
   CC: diabetes f/u  This is a telephone encounter between [name] and [name] on 07/13/2020 for diabetes follow up and adjustment of insulin regimen. The visit was conducted with the patient located at home and [place] at [facility]. The patient's identity was confirmed using their DOB and current address. The patient has consented to being evaluated through a telephone encounter and understands the associated risks (an examination cannot be done and the patient may need to come in for an appointment) / benefits (allows the patient to remain at home, decreasing exposure to coronavirus). I personally spent 10 minutes on medical discussion.   HPI:  [name] is a 44 y.o. with PMH as below presenting for diabetes f/u. She is currently on Levemir 40U and Novolog 10U tid and notes her CBGs ranging from 250-350. Patient would like improved glycemic control. Please see problem based charting for complete assessment and plan.  Please see A&P for assessment of the patient's acute and chronic medical conditions.   Past Medical History:  Diagnosis Date  . Arthritis    "beginnings in my hands" (05/26/2013)  . Diabetes mellitus without complication ([place]) 8333  . GERD (gastroesophageal reflux disease)    otc meds prn  . Migraines, neuralgic    '~ 3/week" (05/26/2013) - otc meds prn  . Morbid obesity ([place])   . Osgood-Schlatter's disease of left knee    left knee  . PCOS (polycystic ovarian syndrome)   . Seasonal allergies   . Tooth pain 05/26/2013   "bottom right" (05/26/2013)  . Uterine fibroid    Review of Systems:  Negative except as listed above.    Assessment & Plan:   See Encounters Tab for problem based charting.  Patient discussed with Dr. Dareen Piano

## 2020-07-13 NOTE — Assessment & Plan Note (Signed)
Most recent HbA1c 9.1 on metformin 1000mg  bid, levemir 40U daily and Novolog 10U tid. At prior visit, patient endorsed issues with taking her meal time Novolog. This was addressed by Butch Penny at a follow up call. Patient is now compliant with her basal and meal time insulin. However, continues to have elevated CBG 250-350. She endorses that when she increases her insulin dosing to Levemir 50U daily and Novolog 20U tid, her CBG improves to 150-180s. She denies any hypoglycemic episodes.   Plan: Increase Levemir to 50U daily and Novolog 20U tid Continue Metformin 1000mg  bid F/u with Butch Penny in 2 weeks for CBG monitoring HbA1c at next visit with PCP

## 2020-07-13 NOTE — Assessment & Plan Note (Signed)
Patient endorses improvement in symptoms with the Augmentin. Still has some "jaw pain" but attributes that to dental caries. No fevers/chills.  Plan: Continue to monitor No further need for CT maxillofacial

## 2020-07-16 NOTE — Addendum Note (Signed)
Addended by: Harvie Heck on: 07/16/2020 01:35 PM   Modules accepted: Level of Service

## 2020-07-16 NOTE — Progress Notes (Signed)
Internal Medicine Clinic Attending ° °Case discussed with Dr. Aslam  At the time of the visit.  We reviewed the resident’s history and exam and pertinent patient test results.  I agree with the assessment, diagnosis, and plan of care documented in the resident’s note.  °

## 2020-07-20 ENCOUNTER — Ambulatory Visit (HOSPITAL_COMMUNITY): Payer: Self-pay

## 2020-07-20 MED FILL — LEVEMIR 100 UNITS/ML VIAL: 100 | 25 days supply | Qty: 10 | Fill #6

## 2020-07-31 ENCOUNTER — Ambulatory Visit: Payer: Self-pay | Admitting: Dietician

## 2020-07-31 ENCOUNTER — Other Ambulatory Visit: Payer: Self-pay

## 2020-07-31 ENCOUNTER — Other Ambulatory Visit: Payer: Self-pay | Admitting: Internal Medicine

## 2020-07-31 ENCOUNTER — Other Ambulatory Visit: Payer: Self-pay | Admitting: Dietician

## 2020-07-31 DIAGNOSIS — E1169 Type 2 diabetes mellitus with other specified complication: Secondary | ICD-10-CM

## 2020-07-31 MED ORDER — INSULIN PEN NEEDLE 32G X 4 MM MISC: 3 refills | Status: DC

## 2020-07-31 MED ORDER — INSULIN DETEMIR 100 UNIT/ML ~~LOC~~ SOLN: 50.0000 [IU] | Freq: Every day | SUBCUTANEOUS | 11 refills | Status: DC

## 2020-07-31 MED ORDER — NOVOLOG FLEXPEN 100 UNIT/ML ~~LOC~~ SOPN: 20.0000 [IU] | PEN_INJECTOR | Freq: Three times a day (TID) | SUBCUTANEOUS | 5 refills | Status: DC

## 2020-07-31 MED FILL — NOVOLOG FLEXPEN SYRINGE: 100 | 25 days supply | Qty: 15 | Fill #0

## 2020-07-31 MED FILL — UNIFINE PENTIPS 32GX5/32: 32G X 4 MM | 30 days supply | Qty: 100 | Fill #0

## 2020-07-31 NOTE — Progress Notes (Signed)
I called Whitney Wagner to follow up in how her blood sugars are doing after Dr. Marva Panda adjusted her insulin. [name] reports she has not changed the doses because they were not changed at the pharmacy. She wants the prescriptions for insulin sent to [facility] and they were sent to [facility].  She states that her blood sugar is going down gradually and the 20 units Novolog is helping. She reports her blood sugar is ~ 180 after meals. She also needs a refill on pen needles at [facility]. She gets her syringes and testing supplies at [facility] which she prefers and does not need any at this time. Plans to get her Covid -19 vaccine tomorrow.   Plan: request insulin prescriptions be sent to [facility] pharmacy and pen needles prescription as well. Follow up as needed

## 2020-07-31 NOTE — Telephone Encounter (Signed)
Patient requests prescriptions for insulin and pen needles be sent to [facility] instead of [facility].

## 2020-07-31 NOTE — Telephone Encounter (Signed)
[place] and left a messge to ask if they could include her pen needles on the IM program.

## 2020-08-01 ENCOUNTER — Ambulatory Visit (HOSPITAL_COMMUNITY): Payer: Self-pay | Attending: Internal Medicine

## 2020-08-06 MED FILL — ATORVASTATIN 20 MG TABLET: 20 | 30 days supply | Qty: 30 | Fill #11

## 2020-08-06 MED FILL — LISINOPRIL 10 MG TABS: 10 | 30 days supply | Qty: 30 | Fill #1

## 2020-08-06 MED FILL — UNIFINE PENTIPS 32GX5/32: 32G X 4 MM | 25 days supply | Qty: 100 | Fill #0

## 2020-08-06 MED FILL — PANTOPRAZOLE SOD DR 40 MG T: 40 | 30 days supply | Qty: 30 | Fill #5

## 2020-08-06 MED FILL — METFORMIN HCL ER 500 MG TB2: 500 | 30 days supply | Qty: 120 | Fill #1

## 2020-08-06 MED FILL — LEVEMIR FLEXTOUCH 100 UNITS: 100 | 30 days supply | Qty: 15 | Fill #0

## 2020-08-06 MED FILL — MELOXICAM 15 MG TABLET: 15 | 30 days supply | Qty: 30 | Fill #2

## 2020-08-07 ENCOUNTER — Other Ambulatory Visit: Payer: Self-pay

## 2020-08-07 DIAGNOSIS — Z1231 Encounter for screening mammogram for malignant neoplasm of breast: Secondary | ICD-10-CM

## 2020-08-13 ENCOUNTER — Encounter: Payer: Self-pay | Admitting: Internal Medicine

## 2020-08-13 ENCOUNTER — Ambulatory Visit: Payer: Self-pay | Admitting: Internal Medicine

## 2020-08-13 ENCOUNTER — Ambulatory Visit (HOSPITAL_COMMUNITY)
Admission: RE | Admit: 2020-08-13 | Discharge: 2020-08-13 | Disposition: A | Discharge status: Home / Self Care | Payer: Self-pay | Source: Ambulatory Visit | Attending: Internal Medicine | Admitting: Internal Medicine

## 2020-08-13 ENCOUNTER — Other Ambulatory Visit: Payer: Self-pay

## 2020-08-13 VITALS — BP 104/64 | HR 92 | Temp 99.3°F | Wt 279.8 lb

## 2020-08-13 DIAGNOSIS — M25572 Pain in left ankle and joints of left foot: Secondary | ICD-10-CM | POA: Insufficient documentation

## 2020-08-13 MED ORDER — HYDROCODONE-ACETAMINOPHEN 5-325 MG PO TABS: 1.0000 | ORAL_TABLET | Freq: Four times a day (QID) | ORAL | 0 refills | Status: AC | PRN

## 2020-08-13 NOTE — Patient Instructions (Addendum)
It was nice seeing you today! Thank you for choosing [facility] for your Primary Care.    Today we talked about:   Left ankle pain: We are getting x-rays today to rule out fracture. I will call you with the results. In addition, we had a boot placed on your ankle for extra stability. For pain control, we sent in a prescription for Norco/Vicodin. Take 1 tablet every 6 hours as needed for pain.    To protect your kidneys, please discontinue daily Mobic and Ibuprofen. To discuss this further, schedule a follow up visit with your PCP.

## 2020-08-13 NOTE — Progress Notes (Addendum)
   CC: Left ankle pain  HPI:  Whitney Wagner is a 44 y.o. with a PMHx as listed below who presents to the clinic for left ankle apin.   Please see the Encounters tab for problem-based Assessment & Plan regarding status of patient's acute and chronic conditions.  Past Medical History:  Diagnosis Date  . Arthritis    "beginnings in my hands" (05/26/2013)  . Diabetes mellitus without complication ([place]) 0865  . GERD (gastroesophageal reflux disease)    otc meds prn  . Migraines, neuralgic    '~ 3/week" (05/26/2013) - otc meds prn  . Morbid obesity ([place])   . Osgood-Schlatter's disease of left knee    left knee  . PCOS (polycystic ovarian syndrome)   . Seasonal allergies   . Tooth pain 05/26/2013   "bottom right" (05/26/2013)  . Uterine fibroid    Review of Systems: Review of Systems  Constitutional: Negative for chills, fever and malaise/fatigue.  Musculoskeletal: Positive for joint pain (L ankle). Negative for back pain, falls, myalgias and neck pain.  Neurological: Positive for weakness (2/2 pain). Negative for dizziness.   Physical Exam:  Vitals:   08/13/20 1448  BP: 104/64  Pulse: 92  Temp: 99.3 F (37.4 C)  TempSrc: Oral  SpO2: 99%  Weight: 279 lb 12.8 oz (126.9 kg)    Physical Exam Vitals and nursing note reviewed.  Constitutional:      General: She is not in acute distress.    Appearance: She is obese.  Musculoskeletal:     Right ankle: Normal.     Left ankle: Swelling (Swelling present around and posterior to the lateral malleolus) present. No deformity, ecchymosis or lacerations. Tenderness present over the lateral malleolus (posterior aspect). No medial malleolus or base of 5th metatarsal tenderness. Normal range of motion.     Left Achilles Tendon: Tenderness present.     Right foot: Normal.     Left foot: No swelling, deformity, bunion or Charcot foot.     Comments: Tenderness of the Achilles tendon with a small 1 cm circular nodule that is also tender,  nonmobile, nonerythematous.  Skin:    Findings: No erythema.     Comments: No increased warmth of the left ankle or erythema  Neurological:     Mental Status: She is alert.     Comments: Left ankle dorsiflexion and extension strength limited by pain     Assessment & Plan:   See Encounters Tab for problem based charting.  Patient discussed with Dr. Evette Doffing

## 2020-08-13 NOTE — Assessment & Plan Note (Addendum)
Ms. Oley presents with a 4-day history of sudden onset left ankle pain that is worse overlying the Achilles tendon and over the lateral aspect.  She cannot recall what activity she was doing when the pain started but notes it started suddenly.  She denies any walking on uneven surfaces, injury, falls, trauma.  Patient states that approximately 1 day after pain started she noted significant swelling and difficulty with bearing weight.  She bought a brace at the store that has helped somewhat with the swelling but not so much with the weightbearing.  Exacerbating factors include any palpation or motion; she has tried Mobic and ibuprofen for alleviation without effect.  She notes a previous history of left ankle pain that self resolved; she cannot recall the diagnosis.  Assessment/plan: Differential includes bursitis versus ligament tear (although without injury less likely). Her history of uncontrolled diabetes places her at risk of acute Charcot joint versus septic arthritis (less likely given lack of erythema, fever, chills). Patient is having pain overlying the lateral malleolus with significant edema and inability to bear weight, so will move forward with x-ray imaging.  Patient mentioned she is taking both Mobic and Ibuprofen. Strongly encouraged she discontinue both with her hx of T2DM and concurrent ACE-I use. For pain control, script for Norco sent to pharmacy.  - Left ankle X-ray pending  - Hard sole boot fitted by orthopedic tech in clinic  - Norco x 3 days for pain  - Consider podiatry consult for preventative care if xray negative

## 2020-08-14 ENCOUNTER — Ambulatory Visit
Admission: RE | Admit: 2020-08-14 | Discharge: 2020-08-14 | Disposition: A | Discharge status: Home / Self Care | Payer: No Typology Code available for payment source | Source: Ambulatory Visit | Attending: Obstetrics and Gynecology | Admitting: Obstetrics and Gynecology

## 2020-08-14 DIAGNOSIS — Z1231 Encounter for screening mammogram for malignant neoplasm of breast: Secondary | ICD-10-CM

## 2020-08-14 NOTE — Progress Notes (Signed)
Internal Medicine Clinic Attending  Case discussed with Dr. Charleen Kirks  At the time of the visit.  We reviewed the resident's history and exam and pertinent patient test results.  I agree with the assessment, diagnosis, and plan of care documented in the resident's note.   Left posterior lateral ankle pain in a person living with diabetic neuropathy. Xrays are negative for acute fracture. There are some nonspecific changes including bone spurring which may or may not be relevant here. At minimum, will recommend 4 weeks in the hard sole boot for supportive care. Will try to help her access podiatry through financial assistance program as well.

## 2020-08-15 ENCOUNTER — Telehealth: Payer: Self-pay | Admitting: Internal Medicine

## 2020-08-15 NOTE — Telephone Encounter (Signed)
Contacted to discuss X-ray results were negative for bony abnormalities that would explain patient's symptoms or exam findings. Instructed to wear boot for 4 weeks and follow up after. If no improvement by then, consider PT versus MRI.

## 2020-08-22 ENCOUNTER — Encounter: Payer: Self-pay | Admitting: Student

## 2020-08-24 ENCOUNTER — Ambulatory Visit (INDEPENDENT_AMBULATORY_CARE_PROVIDER_SITE_OTHER): Payer: Self-pay | Admitting: Student

## 2020-08-24 ENCOUNTER — Encounter: Payer: Self-pay | Admitting: Student

## 2020-08-24 ENCOUNTER — Other Ambulatory Visit: Payer: Self-pay

## 2020-08-24 VITALS — BP 115/58 | HR 98 | Temp 98.3°F | Ht 63.0 in | Wt 279.7 lb

## 2020-08-24 DIAGNOSIS — L732 Hidradenitis suppurativa: Secondary | ICD-10-CM | POA: Insufficient documentation

## 2020-08-24 DIAGNOSIS — M14672 Charcot's joint, left ankle and foot: Secondary | ICD-10-CM

## 2020-08-24 DIAGNOSIS — E1169 Type 2 diabetes mellitus with other specified complication: Secondary | ICD-10-CM

## 2020-08-24 LAB — POCT GLYCOSYLATED HEMOGLOBIN (HGB A1C): Hemoglobin A1C: 8.7 % — AB (ref 4.0–5.6)

## 2020-08-24 LAB — GLUCOSE, CAPILLARY: Glucose-Capillary: 156 mg/dL — ABNORMAL HIGH (ref 70–99)

## 2020-08-24 MED ORDER — CLINDAMYCIN PHOSPHATE 1 % EX SOLN: Freq: Two times a day (BID) | CUTANEOUS | 2 refills | Status: DC

## 2020-08-24 MED ORDER — CLINDAMYCIN PHOSPHATE 1 % EX LOTN: TOPICAL_LOTION | Freq: Two times a day (BID) | CUTANEOUS | 2 refills | Status: DC

## 2020-08-24 MED FILL — CLINDAMYCIN PH 1% SOLUTION: 1 | 30 days supply | Qty: 60 | Fill #0

## 2020-08-24 NOTE — Progress Notes (Signed)
   CC: Ankle pain and axillary rash  HPI:  [name] is a 44 y.o. female with past medical history of hypertension, type 2 diabetes, smoking abuse, who presented to the clinic for left ankle pain and bilateral axillary rash.  Please see problem based charting for further details.  Past Medical History:  Diagnosis Date  . Arthritis    "beginnings in my hands" (05/26/2013)  . Diabetes mellitus without complication ([place]) 0100  . GERD (gastroesophageal reflux disease)    otc meds prn  . Migraines, neuralgic    '~ 3/week" (05/26/2013) - otc meds prn  . Morbid obesity ([facility])   . Osgood-Schlatter's disease of left knee    left knee  . PCOS (polycystic ovarian syndrome)   . Seasonal allergies   . Tooth pain 05/26/2013   "bottom right" (05/26/2013)  . Uterine fibroid    Review of Systems: As per HPI  Physical Exam:  Physical Exam Constitutional:      General: She is not in acute distress.    Appearance: She is obese.  HENT:     Head: Normocephalic.  Eyes:     General: No scleral icterus. Cardiovascular:     Rate and Rhythm: Normal rate and regular rhythm.     Heart sounds: No murmur heard.   Pulmonary:     Effort: No respiratory distress.     Breath sounds: Normal breath sounds.  Abdominal:     General: Bowel sounds are normal.     Palpations: Abdomen is soft.  Musculoskeletal:     Cervical back: Normal range of motion.     Comments: Mild effusion of left ankle.  Very tender to touch at midfoot and especially at the Achilles tendon.  Not erythematous  Skin:    General: Skin is warm.     Coloration: Skin is not jaundiced.     Comments: Multiple lumps noted at bilateral axilla, erythematous, non-drainage  Psychiatric:        Mood and Affect: Mood normal.     Vitals:   08/24/20 1504  BP: (!) 115/58  Pulse: 98  Temp: 98.3 F (36.8 C)  TempSrc: Oral  SpO2: 100%  Weight: 279 lb 11.2 oz (126.9 kg)  Height: 5\' 3"  (1.6 m)     Assessment & Plan:   See  Encounters Tab for problem based charting.  Patient seen with Dr. Evette Doffing

## 2020-08-24 NOTE — Assessment & Plan Note (Addendum)
Hemoglobin A1c today 8.7 (9.1 in June).  Patient states that she has been trying to eat healthy.  However she states that she sometimes has been inconsistent with insulin injection due to work.  Blood glucose report confirms this inconsistency.  Patient is currentl administering Levemir 50 units daily and NovoLog 20 units 3 times with meals.  Invokana and Ozempic were tried in the past but unable to tolerate due to side effects.  She has been follow-up with Butch Penny and advised to continue to Dr. Butch Penny if she has any questions.  Plan: -Continue Metformin 1000 mg twice daily -Levemir 50 units daily -NovoLog 20 units 3 times daily -Patient is advised to be more consistent with insulin injection -Continue CBG check at home -Continue to work on diet and weight loss -She is due for an eye exam

## 2020-08-24 NOTE — Assessment & Plan Note (Addendum)
Patient complains of left ankle pain that started 2 weeks ago.  She denies any trauma and states that pain started when she was laying on bed.  Pain mostly localized at midfoot and heel and Achilles tendon.  Very tender to touch.  Previous x-ray is negative for fracture, some bone spurs are noted.  This is likely Charcot joint in its early stage.  Patient is advised to continue using the boot for at least 6 weeks.  She is also advised to contact [facility] for follow-up.  Plan: -Continue using hard soled boot for support -Follow-up with [facility] -Control diabetes

## 2020-08-24 NOTE — Patient Instructions (Addendum)
Whitney Wagner,  It was a pleasure getting to know you today.  Here is a summary of what we talked about:  1.Charcot joint.  This is inflammation process as a consequence of diabetes.  Please continue using the boot for 5 more weeks.  Please contact [facility] for follow-up.  [facility] [place], [place], [place] [address] Phone: [phone]  2.  Hidradenitis suppurativa: This is a chronic inflammation and infection of the sweat glands.  Usually happen in the armpits or groin.  I will prescribe a antibiotic cream.  3.  Diabetes: Please try to be more consistent with insulin injection and also nutrition.  You can always contact Butch Penny if you have any questions, she is a Microbiologist.  Please contact us if you have any questions or concerns  Take care, Whitney Gerold, DO

## 2020-08-24 NOTE — Assessment & Plan Note (Addendum)
Patient complaints of lumps on bilateral axillas that started 1 month ago.  She states that it is painful and itchy and draining.  No new deodorants or detergents started recently.  This is likely hidradenitis suppurativa given underlying diabetes and obesity.  We will try topical clindamycin.  I also discussed weight loss and control of diabetes.  Patient states that she will work on diet with healthier option.  Her partner, who is also newly diabetic, will help her.  Patient states that she cannot exercise at the moment due to ankle pain.  Plan: -Topical clindamycin -Weight loss with diet control

## 2020-09-03 NOTE — Progress Notes (Signed)
Internal Medicine Clinic Attending  I saw and evaluated the patient.  I personally confirmed the key portions of the history and exam documented by Dr. Nguyen and I reviewed pertinent patient test results.  The assessment, diagnosis, and plan were formulated together and I agree with the documentation in the resident's note.\  

## 2020-09-04 ENCOUNTER — Other Ambulatory Visit: Payer: Self-pay | Admitting: Student

## 2020-09-04 ENCOUNTER — Other Ambulatory Visit: Payer: Self-pay | Admitting: Internal Medicine

## 2020-09-04 DIAGNOSIS — E785 Hyperlipidemia, unspecified: Secondary | ICD-10-CM

## 2020-09-04 DIAGNOSIS — E1169 Type 2 diabetes mellitus with other specified complication: Secondary | ICD-10-CM

## 2020-09-04 DIAGNOSIS — K219 Gastro-esophageal reflux disease without esophagitis: Secondary | ICD-10-CM

## 2020-09-04 DIAGNOSIS — M25532 Pain in left wrist: Secondary | ICD-10-CM

## 2020-09-04 MED FILL — METFORMIN HCL ER 500 MG TB2: 500 | 30 days supply | Qty: 120 | Fill #2

## 2020-09-04 MED FILL — MELOXICAM 15 MG TABLET: 15 | 30 days supply | Qty: 30 | Fill #0

## 2020-09-04 MED FILL — LISINOPRIL 10 MG TABS: 10 | 30 days supply | Qty: 30 | Fill #2

## 2020-09-04 MED FILL — PANTOPRAZOLE SOD DR 40 MG T: 40 | 30 days supply | Qty: 30 | Fill #0

## 2020-09-04 MED FILL — ATORVASTATIN 20 MG TABLET: 20 | 30 days supply | Qty: 30 | Fill #0

## 2020-09-04 MED FILL — NOVOLOG FLEXPEN SYRINGE: 100 | 25 days supply | Qty: 15 | Fill #1

## 2020-09-04 MED FILL — UNIFINE PENTIPS 32GX5/32: 32G X 4 MM | 25 days supply | Qty: 100 | Fill #1

## 2020-09-04 MED FILL — LEVEMIR FLEXTOUCH 100 UNITS: 100 | 30 days supply | Qty: 15 | Fill #1

## 2020-09-04 NOTE — Telephone Encounter (Signed)
Rx request received. Ms. Alcantar takes atorvastatin 20mg  for hyperlipidemia, melixocam 15mg  for osteoarthritis, and pantoprazole 40mg  for GERD. Latest BMP with normal kidney function, will need to re-check at next visit. Will continue with all medications.

## 2020-09-05 ENCOUNTER — Other Ambulatory Visit: Payer: Self-pay

## 2020-09-05 ENCOUNTER — Encounter: Payer: Self-pay | Admitting: Internal Medicine

## 2020-09-05 ENCOUNTER — Ambulatory Visit: Payer: Self-pay | Admitting: Internal Medicine

## 2020-09-05 DIAGNOSIS — H6091 Unspecified otitis externa, right ear: Secondary | ICD-10-CM

## 2020-09-05 DIAGNOSIS — L988 Other specified disorders of the skin and subcutaneous tissue: Secondary | ICD-10-CM

## 2020-09-05 DIAGNOSIS — H6090 Unspecified otitis externa, unspecified ear: Secondary | ICD-10-CM | POA: Insufficient documentation

## 2020-09-05 DIAGNOSIS — Z8614 Personal history of Methicillin resistant Staphylococcus aureus infection: Secondary | ICD-10-CM

## 2020-09-05 MED ORDER — CIPRO HC 0.2-1 % OT SUSP: 3.0000 [drp] | Freq: Two times a day (BID) | OTIC | 0 refills | Status: AC

## 2020-09-05 MED ORDER — SULFAMETHOXAZOLE-TRIMETHOPRIM 800-160 MG PO TABS: 1.0000 | ORAL_TABLET | Freq: Two times a day (BID) | ORAL | 0 refills | Status: AC

## 2020-09-05 MED FILL — SULFAMETHOXAZOLE-TMP DS TAB: 800-160 | 7 days supply | Qty: 14 | Fill #0

## 2020-09-05 MED FILL — CIPRO HC OTIC SUSPENSION: 0.2-1 | 7 days supply | Qty: 10 | Fill #0

## 2020-09-05 NOTE — Progress Notes (Signed)
   CC: Ear Pain, Left hip wound  HPI:  [name] is a 44 y.o. female, with a pmh noted below, who presents to the clinic for ear pain and a wound on her left thigh. To see the management of her acute and chronic conditions, please see the A&P note under the encounters tab.   Past Medical History:  Diagnosis Date  . Arthritis    "beginnings in my hands" (05/26/2013)  . Diabetes mellitus without complication ([place]) 3007  . GERD (gastroesophageal reflux disease)    otc meds prn  . Migraines, neuralgic    '~ 3/week" (05/26/2013) - otc meds prn  . Morbid obesity ([place])   . Osgood-Schlatter's disease of left knee    left knee  . PCOS (polycystic ovarian syndrome)   . Seasonal allergies   . Tooth pain 05/26/2013   "bottom right" (05/26/2013)  . Uterine fibroid    Review of Systems:   Review of Systems  Constitutional: Negative for chills, malaise/fatigue and weight loss.  HENT: Positive for ear pain. Negative for congestion, ear discharge, hearing loss, nosebleeds, sinus pain, sore throat and tinnitus.   Respiratory: Negative for cough, hemoptysis, sputum production, shortness of breath and stridor.   Cardiovascular: Negative for chest pain, palpitations and orthopnea.  Gastrointestinal: Negative for abdominal pain, constipation, diarrhea, nausea and vomiting.     Physical Exam:  Vitals:   09/05/20 1050  BP: 132/82  Temp: 99.5 F (37.5 C)  TempSrc: Oral  SpO2: 98%  Weight: 283 lb (128.4 kg)   Physical Exam Constitutional:      General: She is not in acute distress.    Appearance: Normal appearance. She is not ill-appearing or toxic-appearing.  HENT:     Head: Normocephalic and atraumatic.     Right Ear: Tympanic membrane and external ear normal. There is no impacted cerumen.     Left Ear: Tympanic membrane, ear canal and external ear normal. There is no impacted cerumen.     Ears:     Comments: R ear canal erythematous with no signs of necrosis/purulence. TM intact  with no drainage or effusion.  Cardiovascular:     Rate and Rhythm: Normal rate and regular rhythm.     Pulses: Normal pulses.     Heart sounds: Normal heart sounds. No murmur heard.  No friction rub. No gallop.   Skin:    General: Skin is warm and dry.     Findings: Lesion present.     Comments: L inguinal purulent, draining skin lesion, minimal erythema around the base.    Neurological:     Mental Status: She is alert and oriented to person, place, and time.      Assessment & Plan:   See Encounters Tab for problem based charting.  Patient discussed with Dr. Rebeca Alert

## 2020-09-05 NOTE — Assessment & Plan Note (Signed)
Patient presents with 2 weeks of R ear pain. She states that it hurts to place her mask on. She takes showers and has not been in lakes, oceans, rivers, hot tubs. On physical examination she does have an erythematous R ear canal with no purulence/necrosis noticed on physical examination. TM intact with no purulence or effusion noted.  - Cipro HC Ordered

## 2020-09-05 NOTE — Patient Instructions (Addendum)
Whitney Wagner,  It was a pleasure meeting you today. Today we discussed your ear pain and skin infection. We are giving you a topical antibiotic (Cipro HC) for your ear, please follow the instructions on the label for 7 days. For your skin infection, we will also provide you for Bactrim DS, please take for a total of 7 days as well. Have a good day!  Sincerely,  Maudie Mercury, MD

## 2020-09-05 NOTE — Assessment & Plan Note (Signed)
Patient with recurrent history of MRSA skin infections presents to the clinic for recurrent skin infection. States that her lesion has been present for approximately a week. She tried her hydradenitis suppurativa on the site, but the antibiotic burned. On physical examination she had a purulent draining skin lesion, with minimal erythema.  - Ordered Bactrim DS x7 days.  - Patient given return cautions and to follow up as needed.

## 2020-09-10 ENCOUNTER — Ambulatory Visit (INDEPENDENT_AMBULATORY_CARE_PROVIDER_SITE_OTHER): Payer: Self-pay | Admitting: Podiatry

## 2020-09-10 ENCOUNTER — Other Ambulatory Visit: Payer: Self-pay

## 2020-09-10 DIAGNOSIS — M659 Synovitis and tenosynovitis, unspecified: Secondary | ICD-10-CM

## 2020-09-10 DIAGNOSIS — M7662 Achilles tendinitis, left leg: Secondary | ICD-10-CM

## 2020-09-10 DIAGNOSIS — M65172 Other infective (teno)synovitis, left ankle and foot: Secondary | ICD-10-CM

## 2020-09-10 DIAGNOSIS — M2142 Flat foot [pes planus] (acquired), left foot: Secondary | ICD-10-CM

## 2020-09-10 DIAGNOSIS — M2141 Flat foot [pes planus] (acquired), right foot: Secondary | ICD-10-CM

## 2020-09-10 DIAGNOSIS — L600 Ingrowing nail: Secondary | ICD-10-CM

## 2020-09-10 NOTE — Progress Notes (Signed)
Subjective: Patient presents today for evaluation of pain to the medial lateral border of the bilateral great toes. Patient is concerned for possible ingrown nail.  She has been dealing with pain and ingrown toenails for several years off and on.  She normally tries to trim them herself or take them out.   Patient also has a longstanding history of chronic ankle pain and crepitus to the left ankle.  She has tolerated this off and on for several years now.  Most recently she has developed some pain around the Achilles tendon.  She also noticed some swelling at the time.  This began approximately 3-4 weeks ago.  Her PCP suspected possible Charcot.  She denies injury and she presents today wearing a cam boot.  Patient presents today for further treatment and evaluation.  Past Medical History:  Diagnosis Date  . Arthritis    "beginnings in my hands" (05/26/2013)  . Diabetes mellitus without complication ([place]) 4034  . GERD (gastroesophageal reflux disease)    otc meds prn  . Migraines, neuralgic    '~ 3/week" (05/26/2013) - otc meds prn  . Morbid obesity ([place])   . Osgood-Schlatter's disease of left knee    left knee  . PCOS (polycystic ovarian syndrome)   . Seasonal allergies   . Tooth pain 05/26/2013   "bottom right" (05/26/2013)  . Uterine fibroid     Objective:  General: Well developed, nourished, in no acute distress, alert and oriented x3   Dermatology: Skin is warm, dry and supple bilateral.  Medial lateral border of the bilateral great toes appears to be erythematous with evidence of an ingrowing nail. Pain on palpation noted to the border of the nail fold. The remaining nails appear unremarkable at this time. There are no open sores, lesions.  Vascular: Dorsalis Pedis artery and Posterior Tibial artery pedal pulses palpable. No lower extremity edema noted.   Neruologic: Grossly intact via light touch bilateral.  Musculoskeletal: Muscular strength within normal limits in all groups  bilateral. Normal range of motion noted to all pedal and ankle joints.   Assesement: #1 Paronychia with ingrowing nail medial lateral border bilateral great toes #2  Ankle synovitis/DJD left #3 pes planus bilateral #4 Achilles tendinitis left   Plan of Care:  1. Patient evaluated.  2. Discussed treatment alternatives and plan of care. Explained nail avulsion procedure and post procedure course to patient. 3. Patient opted for permanent partial nail avulsion of the medial and lateral border of the bilateral great toes.  4. Prior to procedure, local anesthesia infiltration utilized using 3 ml of a 50:50 mixture of 2% plain lidocaine and 0.5% plain marcaine in a normal hallux block fashion and a betadine prep performed.  5. Partial permanent nail avulsion with chemical matrixectomy performed using [id number] applications of phenol followed by alcohol flush.  6. Light dressing applied. 7.  Silvadene cream provided for the patient.  Apply daily with a Band-Aid  8.  Injection of 0.5 cc Celestone Soluspan injected into the left ankle joint and posterior Achilles tendon area  9.  Continue wearing cam boot daily x3 weeks  10.  Continue meloxicam daily as prescribed by PCP  11.  Return to clinic 3 weeks.  Edrick Kins, DPM [facility]  Dr. Edrick Kins, DPM    [address]  [place], [place] [address]                Office [phone]  Fax [phone]

## 2020-09-10 NOTE — Patient Instructions (Signed)

## 2020-09-19 NOTE — Progress Notes (Signed)
Internal Medicine Clinic Attending  Case discussed with Dr. Winters at the time of the visit.  We reviewed the resident's history and exam and pertinent patient test results.  I agree with the assessment, diagnosis, and plan of care documented in the resident's note.  Niza Soderholm, M.D., Ph.D.  

## 2020-10-01 ENCOUNTER — Ambulatory Visit: Payer: Self-pay | Admitting: Podiatry

## 2020-10-08 ENCOUNTER — Other Ambulatory Visit: Payer: Self-pay | Admitting: Internal Medicine

## 2020-10-08 DIAGNOSIS — E1169 Type 2 diabetes mellitus with other specified complication: Secondary | ICD-10-CM

## 2020-10-08 MED FILL — ATORVASTATIN 20 MG TABLET: 20 | 30 days supply | Qty: 30 | Fill #1

## 2020-10-08 MED FILL — METFORMIN HCL ER 500 MG TB2: 500 | 30 days supply | Qty: 120 | Fill #0

## 2020-10-08 MED FILL — LEVEMIR FLEXTOUCH 100 UNITS: 100 | 30 days supply | Qty: 15 | Fill #2

## 2020-10-08 MED FILL — PANTOPRAZOLE SOD DR 40 MG T: 40 | 30 days supply | Qty: 30 | Fill #1

## 2020-10-08 MED FILL — MELOXICAM 15 MG TABLET: 15 | 30 days supply | Qty: 30 | Fill #1

## 2020-10-08 MED FILL — NOVOLOG FLEXPEN SYRINGE: 100 | 25 days supply | Qty: 15 | Fill #2

## 2020-10-08 MED FILL — LISINOPRIL 10 MG TABS: 10 | 30 days supply | Qty: 30 | Fill #3

## 2020-10-10 ENCOUNTER — Other Ambulatory Visit: Payer: Self-pay

## 2020-10-10 ENCOUNTER — Other Ambulatory Visit: Payer: Self-pay | Admitting: Podiatry

## 2020-10-10 ENCOUNTER — Ambulatory Visit (INDEPENDENT_AMBULATORY_CARE_PROVIDER_SITE_OTHER): Payer: Self-pay | Admitting: Podiatry

## 2020-10-10 DIAGNOSIS — M7662 Achilles tendinitis, left leg: Secondary | ICD-10-CM

## 2020-10-10 DIAGNOSIS — M659 Synovitis and tenosynovitis, unspecified: Secondary | ICD-10-CM

## 2020-10-10 DIAGNOSIS — L600 Ingrowing nail: Secondary | ICD-10-CM

## 2020-10-10 MED ORDER — SULFAMETHOXAZOLE-TRIMETHOPRIM 800-160 MG PO TABS: 1.0000 | ORAL_TABLET | Freq: Two times a day (BID) | ORAL | 0 refills | Status: DC

## 2020-10-10 NOTE — Progress Notes (Signed)
   Subjective: 44 y.o. female presents today status post permanent nail avulsion procedure of the medial lateral borders of bilateral great toes that was performed on 09/10/2020.  Patient states that she was doing very well however over the last 3-4 days she started to notice purulence with drainage and redness to the toes.  She believes that it was when she went to the beach recently she may have gotten the infection.  She states that she does have history of MRSA.  She is concerned for infection today. She states that the injection to her Achilles tendon of the left lower extremity helped significantly and she is currently pain-free.  Past Medical History:  Diagnosis Date  . Arthritis    "beginnings in my hands" (05/26/2013)  . Diabetes mellitus without complication ([place]) 5449  . GERD (gastroesophageal reflux disease)    otc meds prn  . Migraines, neuralgic    '~ 3/week" (05/26/2013) - otc meds prn  . Morbid obesity ([place])   . Osgood-Schlatter's disease of left knee    left knee  . PCOS (polycystic ovarian syndrome)   . Seasonal allergies   . Tooth pain 05/26/2013   "bottom right" (05/26/2013)  . Uterine fibroid     Objective: Skin is warm, dry and supple. Nail and respective nail fold appears to be erythematous with purulent drainage. Open wound to the associated nail fold with a granular wound base and moderate amount of fibrotic tissue. Minimal drainage noted. Mild erythema around the periungual region likely due to infection Assessment: #1 postop permanent partial nail avulsion medial lateral borders bilateral great toes #2 cellulitis bilateral great toes #3 ankle synovitis/DJD with Achilles tendinitis left-resolved  Plan of care: #1 patient was evaluated  #2 debridement of open wound was performed to the periungual border of the respective toe using a currette. Antibiotic ointment and Band-Aid was applied. #3  Culture taken and sent to pathology of the left hallux drainage  #4  prescription for Bactrim DS #28 2 times daily  #5 patient is to return to clinic in 2 weeks   Edrick Kins, DPM [facility]  Dr. Edrick Kins, [address]                                        [place], [place] [address]                Office [phone]  Fax [phone]

## 2020-10-13 LAB — WOUND CULTURE
MICRO NUMBER:: 11096548
SPECIMEN QUALITY:: ADEQUATE

## 2020-10-13 LAB — HOUSE ACCOUNT TRACKING

## 2020-10-24 ENCOUNTER — Ambulatory Visit: Payer: Self-pay | Admitting: Podiatry

## 2020-11-07 ENCOUNTER — Other Ambulatory Visit: Payer: Self-pay

## 2020-11-07 ENCOUNTER — Other Ambulatory Visit: Payer: Self-pay | Admitting: Student

## 2020-11-07 DIAGNOSIS — B3731 Acute candidiasis of vulva and vagina: Secondary | ICD-10-CM

## 2020-11-07 DIAGNOSIS — K219 Gastro-esophageal reflux disease without esophagitis: Secondary | ICD-10-CM

## 2020-11-07 DIAGNOSIS — B373 Candidiasis of vulva and vagina: Secondary | ICD-10-CM

## 2020-11-07 DIAGNOSIS — E1169 Type 2 diabetes mellitus with other specified complication: Secondary | ICD-10-CM

## 2020-11-07 MED ORDER — FLUCONAZOLE 150 MG PO TABS: 150.0000 mg | ORAL_TABLET | ORAL | 0 refills | Status: DC

## 2020-11-07 MED ORDER — METFORMIN HCL ER 500 MG PO TB24: 1000.0000 mg | ORAL_TABLET | Freq: Two times a day (BID) | ORAL | 2 refills | Status: DC

## 2020-11-07 MED FILL — PANTOPRAZOLE SOD DR 40 MG T: 40 | 30 days supply | Qty: 30 | Fill #0

## 2020-11-07 MED FILL — ATORVASTATIN CALCIUM 20 MG: 20 | 30 days supply | Qty: 30 | Fill #2

## 2020-11-07 MED FILL — LEVEMIR FLEXTOUCH 100 UNITS: 100 | 30 days supply | Qty: 15 | Fill #3

## 2020-11-07 MED FILL — LISINOPRIL 10 MG TABS: 10 | 30 days supply | Qty: 30 | Fill #4

## 2020-11-07 MED FILL — MELOXICAM 15 MG TABLET: 15 | 30 days supply | Qty: 30 | Fill #2

## 2020-11-07 MED FILL — METFORMIN HCL ER 500 MG TB2: 500 | 30 days supply | Qty: 120 | Fill #0

## 2020-11-07 MED FILL — NOVOLOG FLEXPEN SYRINGE: 100 | 25 days supply | Qty: 15 | Fill #3

## 2020-11-07 NOTE — Telephone Encounter (Signed)
fluconazole (DIFLUCAN) 150 MG tablet, refill request @  [place], [place] Phone:  [phone]  Fax:  [phone]

## 2020-11-07 NOTE — Telephone Encounter (Signed)
Rx for diflucan last sent 07/13/2020. Patient had 2 weeks of antibiotics ordered 10/10/2020. Returned call to patient to learn reason for refill request for duflucan. No answer. Left message on VM requesting return call. Hubbard Hartshorn, RN, BSN

## 2020-11-07 NOTE — Addendum Note (Signed)
Addended by: Velora Heckler on: 11/07/2020 02:55 PM   Modules accepted: Orders

## 2020-11-07 NOTE — Telephone Encounter (Signed)
Patient returned call. States this is a typical sugar-spike yeast infection. States her mother-in-law passed recently and they had to travel to [place], eating different kinds of foods, so blood sugars have been running in 300s. C/o vaginal burning, itching and white vaginal discharge. Offered to have Butch Penny call her for assistance managing her sugars but she declined at this time. States, "I know what to do, it's just been a chaotic time."  L. Edilson Vital, BSN, RN-BC

## 2020-11-22 ENCOUNTER — Encounter: Payer: Self-pay | Admitting: Student

## 2020-11-22 ENCOUNTER — Other Ambulatory Visit: Payer: Self-pay

## 2020-11-22 ENCOUNTER — Ambulatory Visit (HOSPITAL_COMMUNITY)
Admission: RE | Admit: 2020-11-22 | Discharge: 2020-11-22 | Disposition: A | Discharge status: Home / Self Care | Payer: No Typology Code available for payment source | Source: Ambulatory Visit | Attending: Internal Medicine | Admitting: Internal Medicine

## 2020-11-22 ENCOUNTER — Ambulatory Visit: Payer: Self-pay | Admitting: Student

## 2020-11-22 VITALS — BP 135/77 | HR 108 | Temp 98.6°F | Ht 63.0 in | Wt 286.0 lb

## 2020-11-22 DIAGNOSIS — M5440 Lumbago with sciatica, unspecified side: Secondary | ICD-10-CM

## 2020-11-22 DIAGNOSIS — R000 Tachycardia, unspecified: Secondary | ICD-10-CM | POA: Insufficient documentation

## 2020-11-22 DIAGNOSIS — M545 Low back pain, unspecified: Secondary | ICD-10-CM | POA: Insufficient documentation

## 2020-11-22 DIAGNOSIS — G8929 Other chronic pain: Secondary | ICD-10-CM

## 2020-11-22 DIAGNOSIS — M5442 Lumbago with sciatica, left side: Secondary | ICD-10-CM

## 2020-11-22 DIAGNOSIS — M5441 Lumbago with sciatica, right side: Secondary | ICD-10-CM

## 2020-11-22 DIAGNOSIS — E1169 Type 2 diabetes mellitus with other specified complication: Secondary | ICD-10-CM

## 2020-11-22 NOTE — Assessment & Plan Note (Signed)
Pulse was 113 and 108 today.  Was 112 with regular rhythm on auscultation.  EKG was obtained which shows sinus tachycardia.  This is likely due to her pain.  We will continue to monitor next visit.

## 2020-11-22 NOTE — Assessment & Plan Note (Signed)
Her last A1c in September was 8.7.  Her glucometer report shows elevated blood sugar with average of 276.  Patient states that she has been working on getting a consistent diet but is still not there.  Her significant other's mother just passed away so that interrupted her routine.  Patient will try to get back on her routine and ensure medication compliance.  Patient has seen Butch Penny a few months ago and states that she has good understanding of her diabetes.  Current regimen -Levemir 50 units daily -NovoLog 20 units 3 times daily -Metformin 1000 mg twice daily  Plan: -Will see patient back in 4 weeks with hopefully better sugar control and check A1c at that time. -Will discuss diet and medication compliance

## 2020-11-22 NOTE — Patient Instructions (Addendum)
[name],  It is a pleasure seeing you today.  Here is a summary of what we talked about:  1.  Low back pain: Please continue taking meloxicam and Lyrica.  Do not take ibuprofen while you are taking meloxicam.  You can also try Voltaren gel and lidocaine patch for added benefits.  Another thing to consider is some light stretching exercise for your lower back when working.  2.  Diabetes: Your blood sugar is still elevated according to the glucometer.  Please work on a more consistent diet and medications.  I will see you back in 4 weeks to discuss your diabetes.  3.  Fast heart rate: This is likely due to pain.  Your EKG did not show any concerning abnormal heart pattern.  Take care,   Dr. Alfonse Spruce

## 2020-11-22 NOTE — Progress Notes (Signed)
   CC: Low back pain  HPI:  [name] is a 44 y.o. with PMH of HTN, DM, obesity, who presents to the clinic for lower back pain that has gotten worse in the last 2 months.  Please see problem based charting for further detail  Past Medical History:  Diagnosis Date  . Arthritis    "beginnings in my hands" (05/26/2013)  . Diabetes mellitus without complication ([place]) 0786  . GERD (gastroesophageal reflux disease)    otc meds prn  . Migraines, neuralgic    '~ 3/week" (05/26/2013) - otc meds prn  . Morbid obesity ([place])   . Osgood-Schlatter's disease of left knee    left knee  . PCOS (polycystic ovarian syndrome)   . Seasonal allergies   . Tooth pain 05/26/2013   "bottom right" (05/26/2013)  . Uterine fibroid    Review of Systems: As per HPI  Physical Exam:  Vitals:   11/22/20 1542 11/22/20 1545  BP: (!) 147/87 135/77  Pulse: (!) 113 (!) 108  Temp: 98.6 F (37 C)   TempSrc: Oral   SpO2: 98%   Weight: 286 lb (129.7 kg)   Height: 5\' 3"  (1.6 m)    Physical Exam Constitutional:      Appearance: She is obese.  Eyes:     General:        Right eye: No discharge.        Left eye: No discharge.  Cardiovascular:     Rate and Rhythm: Regular rhythm. Tachycardia present.  Pulmonary:     Effort: No respiratory distress.     Breath sounds: Normal breath sounds.  Musculoskeletal:     Comments: Pain to palpation at L5--S1. Pain to palpation to bilateral hips Tenderness of bilateral hips with hip flexion Muscle hypertonicity of bilateral lower back  Skin:    General: Skin is warm.  Neurological:     Mental Status: She is alert.     Assessment & Plan:   See Encounters Tab for problem based charting.  Patient seen with Dr. Rebeca Alert
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
## 2020-11-23 ENCOUNTER — Encounter: Payer: No Typology Code available for payment source | Admitting: Student

## 2020-12-04 NOTE — Progress Notes (Signed)
Internal Medicine Clinic Attending  I saw and evaluated the patient.  I personally confirmed the key portions of the history and exam documented by Dr. Alfonse Spruce and I reviewed pertinent patient test results.  The assessment, diagnosis, and plan were formulated together and I agree with the documentation in the resident's note.  Lenice Pressman, M.D., Ph.D.

## 2020-12-12 ENCOUNTER — Other Ambulatory Visit: Payer: Self-pay | Admitting: Internal Medicine

## 2020-12-12 ENCOUNTER — Other Ambulatory Visit: Payer: Self-pay | Admitting: Student

## 2020-12-12 DIAGNOSIS — M25532 Pain in left wrist: Secondary | ICD-10-CM

## 2020-12-12 DIAGNOSIS — M25531 Pain in right wrist: Secondary | ICD-10-CM

## 2020-12-12 DIAGNOSIS — I10 Essential (primary) hypertension: Secondary | ICD-10-CM

## 2020-12-12 MED ORDER — LISINOPRIL 10 MG PO TABS: 10.0000 mg | ORAL_TABLET | Freq: Every day | ORAL | 4 refills | Status: DC

## 2020-12-12 MED ORDER — MELOXICAM 15 MG PO TABS: 15.0000 mg | ORAL_TABLET | Freq: Every day | ORAL | 0 refills | Status: DC

## 2020-12-12 MED FILL — PANTOPRAZOLE SOD DR 40 MG T: 40 | 30 days supply | Qty: 30 | Fill #1

## 2020-12-12 MED FILL — MELOXICAM 15 MG TABLET: 15 | 30 days supply | Qty: 30 | Fill #0

## 2020-12-12 MED FILL — NOVOLOG FLEXPEN SYRINGE: 100 | 25 days supply | Qty: 15 | Fill #4

## 2020-12-12 MED FILL — LISINOPRIL 10 MG TABS: 10 | 30 days supply | Qty: 30 | Fill #0

## 2020-12-12 MED FILL — LEVEMIR FLEXTOUCH 100 UNITS: 100 | 30 days supply | Qty: 15 | Fill #4

## 2020-12-12 MED FILL — METFORMIN HCL ER 500 MG TB2: 500 | 30 days supply | Qty: 120 | Fill #1

## 2020-12-12 MED FILL — ATORVASTATIN CALCIUM 20 MG: 20 | 30 days supply | Qty: 30 | Fill #3

## 2020-12-12 NOTE — Telephone Encounter (Signed)
Please check BMP at next visit

## 2020-12-12 NOTE — Telephone Encounter (Signed)
BMP at next visit

## 2020-12-19 ENCOUNTER — Encounter: Payer: No Typology Code available for payment source | Admitting: Internal Medicine

## 2020-12-26 ENCOUNTER — Encounter: Payer: No Typology Code available for payment source | Admitting: Student

## 2021-01-04 ENCOUNTER — Other Ambulatory Visit: Payer: Self-pay | Admitting: Internal Medicine

## 2021-01-04 ENCOUNTER — Encounter: Payer: Self-pay | Admitting: Internal Medicine

## 2021-01-04 ENCOUNTER — Telehealth: Payer: Self-pay

## 2021-01-04 DIAGNOSIS — G629 Polyneuropathy, unspecified: Secondary | ICD-10-CM

## 2021-01-04 DIAGNOSIS — E1169 Type 2 diabetes mellitus with other specified complication: Secondary | ICD-10-CM

## 2021-01-04 DIAGNOSIS — L03311 Cellulitis of abdominal wall: Secondary | ICD-10-CM

## 2021-01-04 MED ORDER — PREGABALIN 100 MG PO CAPS: 100.0000 mg | ORAL_CAPSULE | Freq: Three times a day (TID) | ORAL | 3 refills | Status: DC

## 2021-01-04 MED ORDER — SULFAMETHOXAZOLE-TRIMETHOPRIM 800-160 MG PO TABS: 1.0000 | ORAL_TABLET | Freq: Two times a day (BID) | ORAL | 0 refills | Status: DC

## 2021-01-04 NOTE — Telephone Encounter (Signed)
Returned call to patient. 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 Has area on abdomen from one side to the other that is 5-6 inches in length, is purple in color, and very painful. States the only thing that has worked for this in past is Bactrim DS. She will send a pic now through [facility]. If possible, she would like Bactrim sent to [facility] in [place]. Also, requesting refill on lyrica at [facility]. She is no longer able to receive current Rx through manufacturer. Hubbard Hartshorn, BSN, RN-BC

## 2021-01-04 NOTE — Progress Notes (Signed)
Patient called regarding cellulitis of her abdominal wall.  Apparently patient had a similar event occur back in October and was prescribed Bactrim.  Patient states that she has well demarcated red rash on her abdomen that is painful to the touch.  She states this is similar to her previous episode.  She has a past history of MRSA cellulitis infections and has a history of diabetes.  I told her is very important to follow-up with our clinic early next week so that we can reevaluate her infection.  Particularly as she is had a history of abscesses as well.  Until then I will put in 1 week course of Bactrim 800- 160 mg twice daily for her cellulitis to cover for MRSA species.  She may need a longer course depending on her progress at her clinic follow-up.  Plan: 1.  Start Bactrim 800-160 mg daily 2.  Follow-up in clinic early next week   Lawerance Cruel, D.O.  Internal Medicine Resident, PGY-2 [facility]  Pager: [phone] 2:24 PM, 01/04/2021

## 2021-01-04 NOTE — Telephone Encounter (Signed)
I prescribed this patient Bactrim and refilled her Lyrica.

## 2021-01-04 NOTE — Telephone Encounter (Signed)
Pls contact pt [phone]

## 2021-01-04 NOTE — Telephone Encounter (Signed)
Please see phone note from today. Hubbard Hartshorn, BSN, RN-BC

## 2021-01-08 ENCOUNTER — Emergency Department (HOSPITAL_COMMUNITY)
Admission: EM | Admit: 2021-01-08 | Discharge: 2021-01-09 | Disposition: A | Discharge status: Home / Self Care | Payer: No Typology Code available for payment source | Attending: Emergency Medicine | Admitting: Emergency Medicine

## 2021-01-08 ENCOUNTER — Encounter (HOSPITAL_COMMUNITY): Payer: Self-pay | Admitting: *Deleted

## 2021-01-08 ENCOUNTER — Telehealth: Payer: Self-pay

## 2021-01-08 ENCOUNTER — Other Ambulatory Visit: Payer: Self-pay

## 2021-01-08 DIAGNOSIS — L089 Local infection of the skin and subcutaneous tissue, unspecified: Secondary | ICD-10-CM | POA: Insufficient documentation

## 2021-01-08 DIAGNOSIS — Z5321 Procedure and treatment not carried out due to patient leaving prior to being seen by health care provider: Secondary | ICD-10-CM | POA: Insufficient documentation

## 2021-01-08 LAB — COMPREHENSIVE METABOLIC PANEL
ALT: 56 U/L — ABNORMAL HIGH (ref 0–44)
AST: 50 U/L — ABNORMAL HIGH (ref 15–41)
Albumin: 3.4 g/dL — ABNORMAL LOW (ref 3.5–5.0)
Alkaline Phosphatase: 62 U/L (ref 38–126)
Anion gap: 13 (ref 5–15)
BUN: 10 mg/dL (ref 6–20)
CO2: 22 mmol/L (ref 22–32)
Calcium: 9.4 mg/dL (ref 8.9–10.3)
Chloride: 97 mmol/L — ABNORMAL LOW (ref 98–111)
Creatinine, Ser: 0.65 mg/dL (ref 0.44–1.00)
GFR, Estimated: >60 mL/min (ref >60)
Glucose, Bld: 297 mg/dL — ABNORMAL HIGH (ref 70–99)
Potassium: 4.2 mmol/L (ref 3.5–5.1)
Sodium: 132 mmol/L — ABNORMAL LOW (ref 135–145)
Total Bilirubin: 0.4 mg/dL (ref 0.3–1.2)
Total Protein: 7 g/dL (ref 6.5–8.1)

## 2021-01-08 LAB — CBC WITH DIFFERENTIAL/PLATELET
Abs Immature Granulocytes: 0.04 10*3/uL (ref 0.00–0.07)
Basophils Absolute: 0.1 10*3/uL (ref 0.0–0.1)
Basophils Relative: 1 %
Eosinophils Absolute: 0.3 10*3/uL (ref 0.0–0.5)
Eosinophils Relative: 3 %
HCT: 41.1 % (ref 36.0–46.0)
Hemoglobin: 13.6 g/dL (ref 12.0–15.0)
Immature Granulocytes: 0 %
Lymphocytes Relative: 24 %
Lymphs Abs: 2.3 10*3/uL (ref 0.7–4.0)
MCH: 26.7 pg (ref 26.0–34.0)
MCHC: 33.1 g/dL (ref 30.0–36.0)
MCV: 80.7 fL (ref 80.0–100.0)
Monocytes Absolute: 0.5 10*3/uL (ref 0.1–1.0)
Monocytes Relative: 5 %
Neutro Abs: 6.4 10*3/uL (ref 1.7–7.7)
Neutrophils Relative %: 67 %
Platelets: 377 10*3/uL (ref 150–400)
RBC: 5.09 MIL/uL (ref 3.87–5.11)
RDW: 12.5 % (ref 11.5–15.5)
WBC: 9.6 10*3/uL (ref 4.0–10.5)
nRBC: 0 % (ref 0.0–0.2)

## 2021-01-08 NOTE — ED Notes (Signed)
Patient states she is going to [facility] in the morning d/t the wait here

## 2021-01-08 NOTE — Telephone Encounter (Signed)
I agree. She should go to the ED for further evaluation.

## 2021-01-08 NOTE — ED Triage Notes (Signed)
Pt reports cellulitis to abd since last week. Was started on antibiotics by pcp with no relief. Now has open area with drainage.

## 2021-01-08 NOTE — Telephone Encounter (Signed)
Returned call to patient. States area on abdomen is smaller in size and less red since starting Bactrim, however, purple area in middle is now "black, necrotic dead tissue, and smelly." Instructed to head directly to ED. States she will. Hubbard Hartshorn, BSN, RN-BC

## 2021-01-08 NOTE — Telephone Encounter (Signed)
Pt called requesting a call back ASAP she had spoken to [facility] before and the problem is has gotten worse

## 2021-01-09 ENCOUNTER — Other Ambulatory Visit: Payer: Self-pay

## 2021-01-09 ENCOUNTER — Inpatient Hospital Stay (HOSPITAL_COMMUNITY): Payer: No Typology Code available for payment source

## 2021-01-09 ENCOUNTER — Inpatient Hospital Stay (HOSPITAL_COMMUNITY)
Admission: AD | Admit: 2021-01-09 | Discharge: 2021-01-12 | DRG: 603 | Disposition: A | Discharge status: Home / Self Care | Payer: No Typology Code available for payment source | Source: Ambulatory Visit | Attending: Internal Medicine | Admitting: Internal Medicine

## 2021-01-09 ENCOUNTER — Encounter: Payer: Self-pay | Admitting: Internal Medicine

## 2021-01-09 ENCOUNTER — Telehealth: Payer: Self-pay

## 2021-01-09 ENCOUNTER — Ambulatory Visit (INDEPENDENT_AMBULATORY_CARE_PROVIDER_SITE_OTHER): Payer: Self-pay | Admitting: Internal Medicine

## 2021-01-09 VITALS — BP 108/71 | HR 100 | Temp 98.6°F | Ht 62.0 in | Wt 281.5 lb

## 2021-01-09 DIAGNOSIS — E871 Hypo-osmolality and hyponatremia: Secondary | ICD-10-CM | POA: Diagnosis present

## 2021-01-09 DIAGNOSIS — Z8249 Family history of ischemic heart disease and other diseases of the circulatory system: Secondary | ICD-10-CM

## 2021-01-09 DIAGNOSIS — F17290 Nicotine dependence, other tobacco product, uncomplicated: Secondary | ICD-10-CM | POA: Diagnosis present

## 2021-01-09 DIAGNOSIS — E1169 Type 2 diabetes mellitus with other specified complication: Secondary | ICD-10-CM

## 2021-01-09 DIAGNOSIS — E1165 Type 2 diabetes mellitus with hyperglycemia: Secondary | ICD-10-CM

## 2021-01-09 DIAGNOSIS — Z79899 Other long term (current) drug therapy: Secondary | ICD-10-CM

## 2021-01-09 DIAGNOSIS — Z20822 Contact with and (suspected) exposure to covid-19: Secondary | ICD-10-CM | POA: Diagnosis present

## 2021-01-09 DIAGNOSIS — L02211 Cutaneous abscess of abdominal wall: Secondary | ICD-10-CM

## 2021-01-09 DIAGNOSIS — Z794 Long term (current) use of insulin: Secondary | ICD-10-CM

## 2021-01-09 DIAGNOSIS — I10 Essential (primary) hypertension: Secondary | ICD-10-CM | POA: Diagnosis present

## 2021-01-09 DIAGNOSIS — L02219 Cutaneous abscess of trunk, unspecified: Secondary | ICD-10-CM | POA: Diagnosis present

## 2021-01-09 DIAGNOSIS — Z791 Long term (current) use of non-steroidal anti-inflammatories (NSAID): Secondary | ICD-10-CM

## 2021-01-09 DIAGNOSIS — Z885 Allergy status to narcotic agent status: Secondary | ICD-10-CM

## 2021-01-09 DIAGNOSIS — E282 Polycystic ovarian syndrome: Secondary | ICD-10-CM | POA: Diagnosis present

## 2021-01-09 DIAGNOSIS — L732 Hidradenitis suppurativa: Secondary | ICD-10-CM

## 2021-01-09 DIAGNOSIS — R7401 Elevation of levels of liver transaminase levels: Secondary | ICD-10-CM | POA: Diagnosis present

## 2021-01-09 DIAGNOSIS — L03311 Cellulitis of abdominal wall: Secondary | ICD-10-CM | POA: Insufficient documentation

## 2021-01-09 DIAGNOSIS — Z6841 Body Mass Index (BMI) 40.0 and over, adult: Secondary | ICD-10-CM

## 2021-01-09 DIAGNOSIS — Z91040 Latex allergy status: Secondary | ICD-10-CM

## 2021-01-09 DIAGNOSIS — K219 Gastro-esophageal reflux disease without esophagitis: Secondary | ICD-10-CM | POA: Diagnosis present

## 2021-01-09 DIAGNOSIS — I96 Gangrene, not elsewhere classified: Secondary | ICD-10-CM | POA: Diagnosis present

## 2021-01-09 DIAGNOSIS — Z833 Family history of diabetes mellitus: Secondary | ICD-10-CM

## 2021-01-09 DIAGNOSIS — Z7984 Long term (current) use of oral hypoglycemic drugs: Secondary | ICD-10-CM

## 2021-01-09 DIAGNOSIS — B9562 Methicillin resistant Staphylococcus aureus infection as the cause of diseases classified elsewhere: Secondary | ICD-10-CM

## 2021-01-09 DIAGNOSIS — Z888 Allergy status to other drugs, medicaments and biological substances status: Secondary | ICD-10-CM

## 2021-01-09 LAB — GLUCOSE, CAPILLARY: Glucose-Capillary: 297 mg/dL — ABNORMAL HIGH (ref 70–99)

## 2021-01-09 LAB — RESP PANEL BY RT-PCR (FLU A&B, COVID) ARPGX2
Influenza A by PCR: NEGATIVE
Influenza B by PCR: NEGATIVE
SARS Coronavirus 2 by RT PCR: NEGATIVE

## 2021-01-09 LAB — MRSA PCR SCREENING: MRSA by PCR: NEGATIVE

## 2021-01-09 MED ORDER — PREGABALIN 100 MG PO CAPS
100.0000 mg | ORAL_CAPSULE | Freq: Three times a day (TID) | ORAL | Status: DC
  Administered 2021-01-09 – 2021-01-12 (×8): 100 mg via ORAL
  Filled 2021-01-09 (×8): qty 1

## 2021-01-09 MED ORDER — ATORVASTATIN CALCIUM 10 MG PO TABS
20.0000 mg | ORAL_TABLET | Freq: Every day | ORAL | Status: DC
  Administered 2021-01-10 – 2021-01-12 (×3): 20 mg via ORAL
  Filled 2021-01-09 (×3): qty 2

## 2021-01-09 MED ORDER — ACETAMINOPHEN 325 MG PO TABS: 650.0000 mg | ORAL_TABLET | Freq: Four times a day (QID) | ORAL | Status: DC | PRN

## 2021-01-09 MED ORDER — INSULIN ASPART 100 UNIT/ML ~~LOC~~ SOLN
0.0000 [IU] | Freq: Three times a day (TID) | SUBCUTANEOUS | Status: DC
  Administered 2021-01-10: 15 [IU] via SUBCUTANEOUS
  Administered 2021-01-10: 7 [IU] via SUBCUTANEOUS
  Administered 2021-01-10: 11 [IU] via SUBCUTANEOUS
  Administered 2021-01-11 – 2021-01-12 (×4): 7 [IU] via SUBCUTANEOUS
  Administered 2021-01-12: 11 [IU] via SUBCUTANEOUS

## 2021-01-09 MED ORDER — LISINOPRIL 10 MG PO TABS
10.0000 mg | ORAL_TABLET | Freq: Every day | ORAL | Status: DC
  Administered 2021-01-10 – 2021-01-12 (×3): 10 mg via ORAL
  Filled 2021-01-09 (×3): qty 1

## 2021-01-09 MED ORDER — INSULIN DETEMIR 100 UNIT/ML ~~LOC~~ SOLN
50.0000 [IU] | Freq: Every day | SUBCUTANEOUS | Status: DC
  Administered 2021-01-09: 50 [IU] via SUBCUTANEOUS
  Filled 2021-01-09 (×2): qty 0.5

## 2021-01-09 MED ORDER — IOHEXOL 300 MG/ML  SOLN
100.0000 mL | Freq: Once | INTRAMUSCULAR | Status: AC | PRN
  Administered 2021-01-09: 100 mL via INTRAVENOUS

## 2021-01-09 MED ORDER — HYDROCODONE-ACETAMINOPHEN 5-325 MG PO TABS
1.0000 | ORAL_TABLET | Freq: Four times a day (QID) | ORAL | Status: DC | PRN
  Administered 2021-01-09 – 2021-01-12 (×9): 1 via ORAL
  Filled 2021-01-09 (×9): qty 1

## 2021-01-09 MED ORDER — ACETAMINOPHEN 650 MG RE SUPP: 650.0000 mg | Freq: Four times a day (QID) | RECTAL | Status: DC | PRN

## 2021-01-09 MED ORDER — VANCOMYCIN HCL 10 G IV SOLR
2500.0000 mg | Freq: Once | INTRAVENOUS | Status: AC
  Administered 2021-01-09: 2500 mg via INTRAVENOUS
  Filled 2021-01-09: qty 2500

## 2021-01-09 MED ORDER — VANCOMYCIN HCL 2000 MG/400ML IV SOLN
2000.0000 mg | INTRAVENOUS | Status: DC
  Filled 2021-01-09: qty 400

## 2021-01-09 MED ORDER — INSULIN ASPART 100 UNIT/ML ~~LOC~~ SOLN
0.0000 [IU] | Freq: Every day | SUBCUTANEOUS | Status: DC
  Administered 2021-01-10 (×2): 3 [IU] via SUBCUTANEOUS
  Administered 2021-01-11: 4 [IU] via SUBCUTANEOUS

## 2021-01-09 MED ORDER — HEPARIN SODIUM (PORCINE) 5000 UNIT/ML IJ SOLN
5000.0000 [IU] | Freq: Three times a day (TID) | INTRAMUSCULAR | Status: DC
  Administered 2021-01-09 – 2021-01-11 (×6): 5000 [IU] via SUBCUTANEOUS
  Filled 2021-01-09 (×6): qty 1

## 2021-01-09 NOTE — Consult Note (Addendum)
Reason for Consult/Chief Complaint: soft tissue cellulitis Consultant: Jimmye Norman, MD  Whitney Wagner is an 45 y.o. female.   HPI: 38F obese with h/o DM and >20y h/o recurrent MRSA cellulitis/abscesses reports an area of her left lower abdominal wall that initially started as an area of erythema on 1/13 that progressed to become more erythematous and now with an area of overlying necrosis. Associated fevers to 102, chills, and mild nausea. Denies trauma to the area other than diabetic injection sites. Denies purulent drainage or malodor. Was seen by PCP and prescribed Bactrim, which she usually takes, but without symptom improvement. Has been taking Bactrim since 1/14. Associated area in L groin with similar symptoms that began around the same time, but this site has resolved. Reports multiple prior abscesses and is unsure if she has had one of the left lower abdomen previously.   Past Medical History:  Diagnosis Date  . Arthritis    "beginnings in my hands" (05/26/2013)  . Diabetes mellitus without complication ([place]) 5093  . GERD (gastroesophageal reflux disease)    otc meds prn  . Migraines, neuralgic    '~ 3/week" (05/26/2013) - otc meds prn  . Morbid obesity ([place])   . Osgood-Schlatter's disease of left knee    left knee  . PCOS (polycystic ovarian syndrome)   . Seasonal allergies   . Tooth pain 05/26/2013   "bottom right" (05/26/2013)  . Uterine fibroid     Past Surgical History:  Procedure Laterality Date  . ABDOMINAL HYSTERECTOMY    . EXTERNAL FIXATION ARM Right 1980's  . ROBOTIC ASSISTED TOTAL HYSTERECTOMY N/A 08/01/2013   Procedure: ROBOTIC ASSISTED TOTAL HYSTERECTOMY;  Surgeon: Lavonia Drafts, MD;  Location: [facility];  Service: Gynecology;  Laterality: N/A;  . S/P Multiple extractions with alveoloplasty  06/01/2013   Extraction of tooth #'s 5,12,30 with alveoloplasty-Dr. Enrique Sack  . WISDOM TOOTH EXTRACTION      Family History  Problem Relation Age of Onset  .  Fibromyalgia Mother   . Cancer Mother   . Hypertension Mother   . Diabetes Mother   . Diabetes Brother   . Hyperlipidemia Brother   . Cancer Maternal Aunt        breast cancer  . Breast cancer Maternal Aunt   . Breast cancer Cousin   . Breast cancer Maternal Aunt   . Breast cancer Maternal Aunt   . Breast cancer Cousin     Social History:  reports that she has been smoking e-cigarettes. She has a 21.00 pack-year smoking history. She quit smokeless tobacco use about 7 years ago. She reports that she does not drink alcohol and does not use drugs.  Allergies:  Allergies  Allergen Reactions  . Latex Itching    Mainly tape...states gloves do not bother her much as long as it is not prolonged use.   Anastasio Auerbach [Canagliflozin] Other (See Comments)    Vulvovaginal candidiasis  . Codeine Other (See Comments)    Codeine liquid iv  drip only as a child - caused hallucinations..  Can tolerate percocet, vicodin      Medications: I have reviewed the patient's current medications.  Results for orders placed or performed in visit on 01/09/21 (from the past 48 hour(s))  Resp Panel by RT-PCR (Flu A&B, Covid) Nasopharyngeal Swab     Status: None   Collection Time: 01/09/21  4:37 PM   Specimen: Nasopharyngeal Swab; Nasopharyngeal(NP) swabs in vial transport medium  Result Value Ref Range   SARS Coronavirus 2  by RT PCR NEGATIVE NEGATIVE    Comment: (NOTE) SARS-CoV-2 target nucleic acids are NOT DETECTED.  The SARS-CoV-2 RNA is generally detectable in upper respiratory specimens during the acute phase of infection. The lowest concentration of SARS-CoV-2 viral copies this assay can detect is 138 copies/mL. A negative result does not preclude SARS-Cov-2 infection and should not be used as the sole basis for treatment or other patient management decisions. A negative result may occur with  improper specimen collection/handling, submission of specimen other than nasopharyngeal swab, presence of  viral mutation(s) within the areas targeted by this assay, and inadequate number of viral copies(<138 copies/mL). A negative result must be combined with clinical observations, patient history, and epidemiological information. The expected result is Negative.  Fact Sheet for Patients:  EntrepreneurPulse.com.au  Fact Sheet for Healthcare Providers:  IncredibleEmployment.be  This test is no t yet approved or cleared by the [place] FDA and  has been authorized for detection and/or diagnosis of SARS-CoV-2 by FDA under an Emergency Use Authorization (EUA). This EUA will remain  in effect (meaning this test can be used) for the duration of the COVID-19 declaration under Section 564(b)(1) of the Act, 21 U.S.C.section 360bbb-3(b)(1), unless the authorization is terminated  or revoked sooner.       Influenza A by PCR NEGATIVE NEGATIVE   Influenza B by PCR NEGATIVE NEGATIVE    Comment: (NOTE) The Xpert Xpress SARS-CoV-2/FLU/RSV plus assay is intended as an aid in the diagnosis of influenza from Nasopharyngeal swab specimens and should not be used as a sole basis for treatment. Nasal washings and aspirates are unacceptable for Xpert Xpress SARS-CoV-2/FLU/RSV testing.  Fact Sheet for Patients: EntrepreneurPulse.com.au  Fact Sheet for Healthcare Providers: IncredibleEmployment.be  This test is not yet approved or cleared by the [place] FDA and has been authorized for detection and/or diagnosis of SARS-CoV-2 by FDA under an Emergency Use Authorization (EUA). This EUA will remain in effect (meaning this test can be used) for the duration of the COVID-19 declaration under Section 564(b)(1) of the Act, 21 U.S.C. section 360bbb-3(b)(1), unless the authorization is terminated or revoked.  Performed at [facility], [place] [address] [place], [place] [address]     No results found.  ROS 10 point  review of systems is negative except as listed above in HPI.   Physical Exam Blood pressure 109/70, pulse 90, temperature 99.5 F (37.5 C), temperature source Oral, resp. rate 19, last menstrual period 07/06/2013, SpO2 96 %. Constitutional: well-developed, well-nourished HEENT: pupils equal, round, reactive to light, 63mm b/l, moist conjunctiva, external inspection of ears and nose normal, hearing intact Oropharynx: normal oropharyngeal mucosa, normal dentition Neck: no thyromegaly, trachea midline, no midline cervical tenderness to palpation Chest: breath sounds equal bilaterally, normal respiratory effort, no midline or lateral chest wall tenderness to palpation/deformity Abdomen: soft, obese, mildly tender, erythematous area of left lower abdominal wall, no drainage, overlying area of necrosis ~4x3cm, no fluctuance, minimal induration beyond the area of necrosis, no hepatosplenomegaly GU: normal female genitalia, L groin with punctate area c/w prior abscess and without associated erythema, drainage, or tenderness Back: no wounds, no thoracic/lumbar spine tenderness to palpation, no thoracic/lumbar spine stepoffs Rectal: deferred Extremities: 2+ radial and pedal pulses bilaterally, motor and sensation intact to bilateral UE and LE, no peripheral edema MSK: normal gait/station, no clubbing/cyanosis of fingers/toes, normal ROM of all four extremities Skin: warm, dry, no rashes Psych: normal memory, normal mood/affect    Assessment/Plan: 58F with abdominal wall cellulitis and superficial  necrosis.  Given history of MRSA, recurrent abscesses, and refractory outpatient treatment, recommend vancomycin therapy. Recommend CT A/P with IV contrast to evaluate for deeper fluid collection, as body habitus precludes definitive exclusion. Hyponatremia noted, however given the length of her symptoms, normal WBC, and afebrile, suspicion for NSTI is low. WOC c/s for local wound care. Will continue to follow.     Jesusita Oka, MD General and [facility]

## 2021-01-09 NOTE — Patient Instructions (Signed)
Whitney Wagner,  It was a pleasure seeing you in clinic. Today we discussed:   Abdominal wall abscess: Given the progression of your cellulitis into abscess, I think you would benefit from admission for IV antibiotics and surgical consult for incision and drainage at this time.   If you have any questions or concerns, please call our clinic at [phone] between 9am-5pm and after hours call [phone] and ask for the internal medicine resident on call. If you feel you are having a medical emergency please call 911.   Thank you, we look forward to helping you remain healthy!

## 2021-01-09 NOTE — Progress Notes (Signed)
Pharmacy Antibiotic Note  Whitney Wagner is a 45 y.o. female with diabetes who was admitted on 01/09/2021 with abdominal wall cellulitis. Pt was treated with TMP/SMX PTA with some improvement in symptoms; however, pt has worsening pain and necrotic tissue in left lower abdomen. Pharmacy has been consulted for vancomycin dosing.  WBC 9.6, Tmax 99.5 F; Scr 0.65, CrCl 115 ml/min  Plan: Vancomycin 2500 mg IV X 1, followed by vancomycin 2000 mg IV Q 24 hrs (estimated vancomycin AUC on this regimen, using Scr 0.8, is 494.8; goal vancomycin AUC is 400-550) Monitor WBC, temp, clinical improvement, renal function, vancomycin levels as indicated   Temp (24hrs), Avg:98.8 F (37.1 C), Min:98.2 F (36.8 C), Max:99.5 F (37.5 C)  Recent Labs  Lab 01/08/21 1606  WBC 9.6  CREATININE 0.65    Estimated Creatinine Clearance: 114.9 mL/min (by C-G formula based on SCr of 0.65 mg/dL).    Allergies  Allergen Reactions  . Latex Itching    Mainly tape...states gloves do not bother her much as long as it is not prolonged use.   Anastasio Auerbach [Canagliflozin] Other (See Comments)    Vulvovaginal candidiasis  . Codeine Other (See Comments)    Codeine liquid iv  drip only as a child - caused hallucinations..  Can tolerate percocet, vicodin      Antimicrobials this admission: 1/19 vancomycin >>  Microbiology results: 1/19 COVID, flu A, flu B: negative  Thank you for allowing pharmacy to be a part of this patient's care.  Gillermina Hu, PharmD, BCPS, [facility] Clinical Pharmacist 01/09/2021 6:47 PM

## 2021-01-09 NOTE — Telephone Encounter (Signed)
TC to patient regarding mychart message w/ picture of wound.  Dr. Marva Panda is resident o/c this morning and agreed to see patient in clinic this afternoon at 3:45.  Pt notified Muir, RN,BSN

## 2021-01-09 NOTE — Progress Notes (Signed)
Report given to Delfino Lovett, RN on [place].  Patient transported via wheelchair to [place].  Alert oriented.  Accompanied by Support Person.  Sander Nephew, RN 01/09/2021 4:57 PM.

## 2021-01-09 NOTE — H&P (Addendum)
Date: 01/09/2021               Patient Name:  Whitney Wagner MRN: [id number]  DOB: [birth year] Age / Sex: 45 y.o., female   PCP: Gaylan Gerold, DO              Medical Service: Internal Medicine Teaching Service              Attending Physician: Dr. Jimmye Norman, Elaina Pattee, MD    First Contact: Azell Der, MS 3 Pager: [phone]  Second Contact: Dr. Wynetta Emery Pager: [phone]            After Hours (After 5p/  First Contact Pager: [phone]  weekends / holidays): Second Contact Pager: [phone]   Chief Complaint: Abdominal Wall Cellulitis  History of Present Illness:  45 y.o female with Hx of HTN, DM (on Insulin, A1C 8.7 in September 2021), MRSA Cellulitis, Obesity, PCOS, and Hidradenitis Supprativa presenting with worsening left abdominal wall cellulitis. Patient with painful, purple area on abdomen starting on 1/13 with no known inciting factors (notably on insulin), recorded home fevers (up to 102 F), chills, and nausea; for which she was started on seven day course of Bactrim on 01/04/21 by PCP following my-chart communication, with planned follow up. Patient noted resolution of fever and chills; and decreasing region of erythema following starting Bactrim, though central area progressed to necrotic, with drainage, and associated stench. Consequently instructed by PCP to go to the ED. She went [facility] ED initially, but left due to long wait times; evaluated by PCP at f/u appointment, who felt patient could benefit from in-patient treatment. Patient conveys maintained appetite during this time; and compliance with home medications. She has taken bactrim as prescribed. Nausea is near resolved. Pain 8/10 at present, though sometimes jumps to 10/10. Patient has history of multiple episodes of cellulitis and abscesses typically resolving following p.o antibiotics. Has had  I&D on right abdomen in the past.     ED Course Patient notably afebrile. CBC significant for normal WBC. CMP: Na 132,  Glucose 297, Bicarb 22, AST 50, ALT 56. Coronavirus negative.    Meds:  Patient endorsed taking  Novolog 20 units TID Levemir 50 units Metformin 1,000mg  Lisinopril 10 mg Protonix 40mg   Allergies: Allergies as of 01/09/2021 - Review Complete 01/09/2021  Allergen Reaction Noted  . Latex Itching 08/10/2013  . Invokana [canagliflozin] Other (See Comments) 03/29/2020  . Codeine Other (See Comments) 02/15/2011   Past Medical History:  Diagnosis Date  . Arthritis    "beginnings in my hands" (05/26/2013)  . Diabetes mellitus without complication ([place]) 6010  . GERD (gastroesophageal reflux disease)    otc meds prn  . Migraines, neuralgic    '~ 3/week" (05/26/2013) - otc meds prn  . Morbid obesity ([place])   . Osgood-Schlatter's disease of left knee    left knee  . PCOS (polycystic ovarian syndrome)   . Seasonal allergies   . Tooth pain 05/26/2013   "bottom right" (05/26/2013)  . Uterine fibroid     Family History:   Family History  Problem Relation Age of Onset  . Fibromyalgia Mother   . Cancer Mother   . Hypertension Mother   . Diabetes Mother   . Diabetes Brother   . Hyperlipidemia Brother   . Cancer Maternal Aunt        breast cancer  . Breast cancer Maternal Aunt   . Breast cancer Cousin   . Breast cancer Maternal Aunt   . Breast cancer  Maternal Aunt   . Breast cancer Cousin     Social History: Patient lives with partner of many years; and 6 cats.  Uses E-cigarette No alcohol or recreational drug use  Review of Systems: A complete ROS was negative except as per HPI.   Physical Exam: Blood pressure 109/70, pulse 90, temperature 99.5 F (37.5 C), temperature source Oral, resp. rate 19, last menstrual period 07/06/2013, SpO2 96 %.  Physical Exam Constitutional:      General: She is not in acute distress.    Appearance: Normal appearance. She is obese. She is not toxic-appearing.  HENT:     Head: Normocephalic and atraumatic.  Cardiovascular:     Rate and  Rhythm: Normal rate and regular rhythm.  Pulmonary:     Effort: Pulmonary effort is normal.     Breath sounds: Normal breath sounds.  Genitourinary:    Comments: Two non-erythematous points on the left peri-vulvular crease expressive of serosanguinous material Musculoskeletal:        General: Normal range of motion.  Skin:    General: Skin is warm and dry.     Findings: Lesion (erythematous area on lower left abdominal fat pad; with superficial appearing necrotric center. Non-crepitous. Dressing notable for purulent expression,) present.  Neurological:     General: No focal deficit present.     Mental Status: She is alert and oriented to person, place, and time.     DZH:GDJM  CXR: none  Assessment & Plan by Problem: Active Problems:   Cellulitis and abscess of trunk  45 y.o female with Hx of HTN, DM (on Insulin, A1C 8.7 in September 2021),  MRSA Cellulitis, Obesity, PCOS, and Hidradenitis Supprativa presenting with progressive abdominal cellulitis following course on bactrim. Resolved fevers and chills.    Abdominal Wall Cellulitis Patient with hx of recurrent cellulitis of various regions typically responsive to p.o antibiotics; and MRSA cellulitis presenting with progressive left abdominal wall cellulitis (onset on 1/13); despite trial on p.o Bactrim. Expressive, superficial appearing, without crepitus. No systemic symptoms, mildly elevated temp.  -IV Vancomycin per pharmacy - surgery consulted for possible I&D, appreciate recommendations -CT Abdomen with contrast ordered, f/u -MRSA PCR -Blood culture -Wound care consulted -PRN Tylenol and norco for break-through pain  Hydradenitis Chronic with new bloody, non-purulent drainage left groin.  -wound care consult  Hyponatremia 2/2 Hyperglycemia in context of Acute Infection Na 132, Glucose 297, bicarb normal. -monitor for resolution   Diabetes Patient with A1C of 8.7 in September 2021. On home insulin and compliant on  meds. -pregabalin for neuropathic pain -SSI - continue home levemir 50U qhs  Transaminitis Slightly elevated AST/ALT. She does have history of steatosis on CT 2014 with transaminitis at that time 2/2 intraabdominal bleed after hysterectomy. No prior hepatitis labs. No alcohol use.  - f/u liver on abd CT - am cmp   Dispo: Admit patient to Inpatient with expected length of stay greater than 2 midnights.  Signed: Azell Der, Medical Student 01/09/2021, 5:56 PM   Attestation for Student Documentation:  I personally was present and performed or re-performed the history, physical exam and medical decision-making activities of this service and have verified that the service and findings are accurately documented in the student's note.  Kevan Prouty A, DO 01/09/2021, 10:56 PM

## 2021-01-09 NOTE — Progress Notes (Signed)
   CC: abdominal wall cellulitis  HPI:  Whitney Wagner is a 45 y.o. female with PMHx of diabetes mellitus presenting for evaluation of abdominal wall cellulitis. Patient notes initial symptoms starting with abdominal redness and area of bruising on 1/13. She endorses subjective fevers with home reading of up to 102F. She was prescribed bactrim on 1/14 with some improvement in symptoms; however, she noted having worsening pain and necrotic tissue in the left lower abdomen although her area of erythema had improved. Patient presented to the ED for this; however, due to long wait times, was unable to be seen. Today, patient presents in clinic for this. Please see problem based charting for complete assessment and plan.   Past Medical History:  Diagnosis Date  . Arthritis    "beginnings in my hands" (05/26/2013)  . Diabetes mellitus without complication ([place]) 3212  . GERD (gastroesophageal reflux disease)    otc meds prn  . Migraines, neuralgic    '~ 3/week" (05/26/2013) - otc meds prn  . Morbid obesity ([place])   . Osgood-Schlatter's disease of left knee    left knee  . PCOS (polycystic ovarian syndrome)   . Seasonal allergies   . Tooth pain 05/26/2013   "bottom right" (05/26/2013)  . Uterine fibroid    Review of Systems:  Negative except as stated in HPI.  Physical Exam:  Vitals:   01/09/21 1551 01/09/21 1552  BP:  108/71  Pulse:  100  Temp:  98.6 F (37 C)  TempSrc:  Oral  SpO2:  97%  Weight: 281 lb 8 oz (127.7 kg)   Height: 5\' 2"  (1.575 m)    Physical Exam  Constitutional: Appears well-developed and well-nourished. No distress.  HENT: Normocephalic and atraumatic, EOMI, conjunctiva normal, moist mucous membranes Cardiovascular: Normal rate, regular rhythm, S1 and S2 present, no murmurs, rubs, gallops.  Distal pulses intact Respiratory: No respiratory distress, no accessory muscle use.  Effort is normal.  Lungs are clear to auscultation bilaterally. GI: Nondistended, soft,  tenderness to palpation in the LLQ over area of erythema, normal active bowel sounds Musculoskeletal: Normal bulk and tone.  No peripheral edema noted. Skin: erythema around pannus, most pronounced in the LLQ with 2-3cm area of necrotic tissue in the center (see below) Psychiatric: Normal mood and affect. Behavior is normal. Judgment and thought content normal.       Assessment & Plan:   See Encounters Tab for problem based charting.  Patient discussed with Dr. Evette Doffing

## 2021-01-09 NOTE — Assessment & Plan Note (Signed)
Ms Whitney Wagner is presenting for one week of abdominal wall cellulitis. She notes her symptoms initially started around 1/13 when she noted an area of discoloration in the left lower abdomen that quickly spread to her entire pannus. She endorses associated fevers up to 102F. She had a telephone encounter for this and, given her history of diabetes and prior MRSA soft tissue infection, was prescribed Bactrim. She notes that this did provide some relief initially with decreasing the area of erythema. However, she developed an area of the left lower abdomen with "black, necrotic dead tissue" that she also endorses had purulent discharge. She presented to the ED for this; however, due to prolonged wait times, was unable to be evaluated.  On examination, patient has area of panniculitis on the left lower abdomen with 2-3cm area of necrotic tissue. No significant purulent discharge noted at this time. However, significant tenderness. She is afebrile and most recent CBC from 01/08/2021 without leukocytosis.  At this time, patient has failed outpatient therapy. Given her history of poorly controlled diabetes, prior MRSA infection, and failure to outpatient therapy, patient would benefit from inpatient admission for IV antibiotics and surgical consult for I&D.   Plan: IMTS consulted for admission

## 2021-01-10 DIAGNOSIS — E1169 Type 2 diabetes mellitus with other specified complication: Secondary | ICD-10-CM

## 2021-01-10 DIAGNOSIS — I10 Essential (primary) hypertension: Secondary | ICD-10-CM

## 2021-01-10 DIAGNOSIS — E6609 Other obesity due to excess calories: Secondary | ICD-10-CM

## 2021-01-10 LAB — COMPREHENSIVE METABOLIC PANEL
ALT: 66 U/L — ABNORMAL HIGH (ref 0–44)
AST: 49 U/L — ABNORMAL HIGH (ref 15–41)
Albumin: 3 g/dL — ABNORMAL LOW (ref 3.5–5.0)
Alkaline Phosphatase: 57 U/L (ref 38–126)
Anion gap: 9 (ref 5–15)
BUN: 11 mg/dL (ref 6–20)
CO2: 25 mmol/L (ref 22–32)
Calcium: 9.1 mg/dL (ref 8.9–10.3)
Chloride: 102 mmol/L (ref 98–111)
Creatinine, Ser: 0.58 mg/dL (ref 0.44–1.00)
GFR, Estimated: >60 mL/min (ref >60)
Glucose, Bld: 219 mg/dL — ABNORMAL HIGH (ref 70–99)
Potassium: 3.8 mmol/L (ref 3.5–5.1)
Sodium: 136 mmol/L (ref 135–145)
Total Bilirubin: 0.3 mg/dL (ref 0.3–1.2)
Total Protein: 6.3 g/dL — ABNORMAL LOW (ref 6.5–8.1)

## 2021-01-10 LAB — CBC WITH DIFFERENTIAL/PLATELET
Abs Immature Granulocytes: 0.02 10*3/uL (ref 0.00–0.07)
Basophils Absolute: 0.1 10*3/uL (ref 0.0–0.1)
Basophils Relative: 1 %
Eosinophils Absolute: 0.3 10*3/uL (ref 0.0–0.5)
Eosinophils Relative: 4 %
HCT: 39.9 % (ref 36.0–46.0)
Hemoglobin: 12.6 g/dL (ref 12.0–15.0)
Immature Granulocytes: 0 %
Lymphocytes Relative: 33 %
Lymphs Abs: 2.6 10*3/uL (ref 0.7–4.0)
MCH: 25.7 pg — ABNORMAL LOW (ref 26.0–34.0)
MCHC: 31.6 g/dL (ref 30.0–36.0)
MCV: 81.3 fL (ref 80.0–100.0)
Monocytes Absolute: 0.5 10*3/uL (ref 0.1–1.0)
Monocytes Relative: 6 %
Neutro Abs: 4.6 10*3/uL (ref 1.7–7.7)
Neutrophils Relative %: 56 %
Platelets: 304 10*3/uL (ref 150–400)
RBC: 4.91 MIL/uL (ref 3.87–5.11)
RDW: 12.4 % (ref 11.5–15.5)
WBC: 8.1 10*3/uL (ref 4.0–10.5)
nRBC: 0 % (ref 0.0–0.2)

## 2021-01-10 LAB — GLUCOSE, CAPILLARY
Glucose-Capillary: 241 mg/dL — ABNORMAL HIGH (ref 70–99)
Glucose-Capillary: 317 mg/dL — ABNORMAL HIGH (ref 70–99)
Glucose-Capillary: 273 mg/dL — ABNORMAL HIGH (ref 70–99)
Glucose-Capillary: 266 mg/dL — ABNORMAL HIGH (ref 70–99)

## 2021-01-10 LAB — HEMOGLOBIN A1C
Hgb A1c MFr Bld: 10.3 % — ABNORMAL HIGH (ref 4.8–5.6)
Mean Plasma Glucose: 248.91 mg/dL

## 2021-01-10 LAB — MAGNESIUM: Magnesium: 1.5 mg/dL — ABNORMAL LOW (ref 1.7–2.4)

## 2021-01-10 LAB — HIV ANTIBODY (ROUTINE TESTING W REFLEX): HIV Screen 4th Generation wRfx: NONREACTIVE

## 2021-01-10 MED ORDER — COLLAGENASE 250 UNIT/GM EX OINT
TOPICAL_OINTMENT | Freq: Every day | CUTANEOUS | Status: DC
  Filled 2021-01-10: qty 30

## 2021-01-10 MED ORDER — MAGNESIUM SULFATE 2 GM/50ML IV SOLN
2.0000 g | Freq: Once | INTRAVENOUS | Status: AC
  Administered 2021-01-10: 2 g via INTRAVENOUS
  Filled 2021-01-10: qty 50

## 2021-01-10 MED ORDER — LIVING WELL WITH DIABETES BOOK
Freq: Once | Status: DC
  Filled 2021-01-10: qty 1

## 2021-01-10 MED ORDER — INSULIN DETEMIR 100 UNIT/ML ~~LOC~~ SOLN
55.0000 [IU] | Freq: Every day | SUBCUTANEOUS | Status: DC
  Administered 2021-01-10: 55 [IU] via SUBCUTANEOUS
  Filled 2021-01-10 (×2): qty 0.55

## 2021-01-10 MED ORDER — VANCOMYCIN HCL IN DEXTROSE 1-5 GM/200ML-% IV SOLN
1000.0000 mg | Freq: Two times a day (BID) | INTRAVENOUS | Status: DC
  Administered 2021-01-10 – 2021-01-11 (×2): 1000 mg via INTRAVENOUS
  Filled 2021-01-10 (×3): qty 200

## 2021-01-10 MED ORDER — INSULIN ASPART 100 UNIT/ML ~~LOC~~ SOLN
6.0000 [IU] | Freq: Three times a day (TID) | SUBCUTANEOUS | Status: DC
  Administered 2021-01-10 – 2021-01-12 (×5): 6 [IU] via SUBCUTANEOUS

## 2021-01-10 NOTE — Hospital Course (Addendum)
Patient states that she has had similar issues before, but did not go away like they usually do. It was very tender and warm, but not firm. Started draining later and was malodorous. States she feels tired, but no fever or other systemic complaints.   States that affordability is a problem with her diabetes. States that healthy food is expensive, but no issues with medications.   Has a partner at home who has back problems, but can help with wound care.

## 2021-01-10 NOTE — Progress Notes (Signed)
[facility] Progress Note     Subjective: CC-  Overall feeling a little better today. WBC 8.1, afebrile. CT abdomen/pelvis negative for acute intra-abdominal findings; abdominal wall cellulitis/necrosis is superficial.   Objective: Vital signs in last 24 hours: Temp:  [97.6 F (36.4 C)-99.5 F (37.5 C)] 97.8 F (36.6 C) (01/20 0645) Pulse Rate:  [83-100] 83 (01/20 0645) Resp:  [18-20] 20 (01/20 0645) BP: (108-130)/(70-79) 115/73 (01/20 0645) SpO2:  [96 %-97 %] 96 % (01/20 0645) Weight:  [127.7 kg] 127.7 kg (01/19 1551)    Intake/Output from previous day: No intake/output data recorded. Intake/Output this shift: No intake/output data recorded.  PE: Gen:  Alert, NAD, pleasant Pulm:  rate and effort normal Abd: obese, soft, left lower abdominal wall cellulitis with ~4x3cm area of necrosis, no fluctuance or purulent drainage Psych: A&Ox4  Skin:  warm and dry  Lab Results:  Recent Labs    01/08/21 1606 01/10/21 0403  WBC 9.6 8.1  HGB 13.6 12.6  HCT 41.1 39.9  PLT 377 304   BMET Recent Labs    01/08/21 1606 01/10/21 0404  NA 132* 136  K 4.2 3.8  CL 97* 102  CO2 22 25  GLUCOSE 297* 219*  BUN 10 11  CREATININE 0.65 0.58  CALCIUM 9.4 9.1   PT/INR No results for input(s): LABPROT, INR in the last 72 hours. CMP     Component Value Date/Time   NA 136 01/10/2021 0404   NA 138 07/27/2019 1422   K 3.8 01/10/2021 0404   CL 102 01/10/2021 0404   CO2 25 01/10/2021 0404   GLUCOSE 219 (H) 01/10/2021 0404   BUN 11 01/10/2021 0404   BUN 11 07/27/2019 1422   CREATININE 0.58 01/10/2021 0404   CALCIUM 9.1 01/10/2021 0404   PROT 6.3 (L) 01/10/2021 0404   PROT 6.9 11/04/2016 1637   ALBUMIN 3.0 (L) 01/10/2021 0404   ALBUMIN 4.3 11/04/2016 1637   AST 49 (H) 01/10/2021 0404   ALT 66 (H) 01/10/2021 0404   ALKPHOS 57 01/10/2021 0404   BILITOT 0.3 01/10/2021 0404   BILITOT <0.2 11/04/2016 1637   GFRNONAA >60 01/10/2021 0404   GFRAA 133 07/27/2019 1422    Lipase  No results found for: LIPASE     Studies/Results: CT ABDOMEN PELVIS W CONTRAST  Result Date: 01/09/2021 CLINICAL DATA:  Evaluate for soft tissue abscess. EXAM: CT ABDOMEN AND PELVIS WITH CONTRAST TECHNIQUE: Multidetector CT imaging of the abdomen and pelvis was performed using the standard protocol following bolus administration of intravenous contrast. CONTRAST:  167mL OMNIPAQUE IOHEXOL 300 MG/ML  SOLN COMPARISON:  January 13, 2019 FINDINGS: Lower chest: No acute abnormality. Hepatobiliary: No focal liver abnormality is seen. No gallstones, gallbladder wall thickening, or biliary dilatation. Pancreas: Unremarkable. No pancreatic ductal dilatation or surrounding inflammatory changes. Spleen: Normal in size without focal abnormality. Adrenals/Urinary Tract: There is a stable 1.4 cm low-attenuation left adrenal mass. The right adrenal gland is normal in appearance. Kidneys are normal, without renal calculi, focal lesion, or hydronephrosis. Bladder is unremarkable. Stomach/Bowel: Stomach is within normal limits. Appendix appears normal. No evidence of bowel wall thickening, distention, or inflammatory changes. Vascular/Lymphatic: No significant vascular findings are present. No enlarged abdominal or pelvic lymph nodes. Reproductive: Status post hysterectomy. No adnexal masses. Other: There is a 1.3 cm x 1.5 cm fat containing umbilical hernia. No free fluid is seen within the abdomen or pelvis. Musculoskeletal: No acute or significant osseous findings. IMPRESSION: 1. No acute intra-abdominal findings. 2. Stable left adrenal  adenoma. 3. Small fat containing umbilical hernia. Electronically Signed   By: Virgina Norfolk M.D.   On: 01/09/2021 22:22    Anti-infectives: Anti-infectives (From admission, onward)   Start     Dose/Rate Route Frequency Ordered Stop   01/10/21 2000  vancomycin (VANCOREADY) IVPB 2000 mg/400 mL        2,000 mg 200 mL/hr over 120 Minutes Intravenous Every 24 hours  01/09/21 1901     01/09/21 1930  vancomycin (VANCOCIN) 2,500 mg in sodium chloride 0.9 % 500 mL IVPB        2,500 mg 250 mL/hr over 120 Minutes Intravenous  Once 01/09/21 1900 01/09/21 2300       Assessment/Plan Obesity PCOS Obesity BMI 51.49  Abdominal wall cellulitis and superficial necrosis - CT abdomen/pelvis negative for acute intra-abdominal findings; abdominal wall cellulitis/necrosis is superficial - Do not recommend any surgical debridement. - Continue wound care as per WOC: once daily wet to dry dressing change with santyl for enzymatic debridement. We will sign off, please call with concerns.  ID - vancomycin 1/19>> FEN - CM diet VTE - sq heparin Foley - none   LOS: 1 day    Wellington Hampshire, [facility] 01/10/2021, 9:21 AM Please see Amion for pager number during day hours 7:00am-4:30pm

## 2021-01-10 NOTE — Progress Notes (Signed)
Subjective:  Patient seen by team. States she feels tired, but not feverish. Pain less intense than the day prior: now at 7/10 today. Degree of pain with this episode of cellulitis is worse than that with prior episodes. Movement augments pain.   Conveys that affordability is a problem with her diabetes. States that healthy food is expensive, but no issues with taking medications.   Has a partner at home who has back problems, but can help with wound care.   Objective:  Vital signs in last 24 hours: Vitals:   01/09/21 1747 01/09/21 2000 01/10/21 0207 01/10/21 0645  BP: 109/70 130/79 116/79 115/73  Pulse: 90 97 84 83  Resp: 19 18 20 20   Temp: 99.5 F (37.5 C) 98.9 F (37.2 C) 97.6 F (36.4 C) 97.8 F (36.6 C)  TempSrc: Oral Oral Oral   SpO2: 96% 97% 96% 96%   Weight change:  No intake or output data in the 24 hours ending 01/10/21 1232  Physical Exam Constitutional:      General: She is not in acute distress.    Appearance: Normal appearance. She is obese.  Pulmonary:     Effort: Pulmonary effort is normal. No respiratory distress.  Skin:    Comments: (see media tab) Large erythematous patch with area of central necrosis. Exquisitely tender to palpation.  Neurological:     Mental Status: She is alert.  Psychiatric:        Mood and Affect: Mood normal.        Behavior: Behavior normal.     Assessment/Plan:  Active Problems:   Cellulitis and abscess of trunk  45 y.o female with Hx of HTN, DM (on Insulin, A1C 8.7 in September 2021),  MRSA Cellulitis, Obesity, PCOS, and Hidradenitis Supprativa presenting with progressive abdominal cellulitis following course on bactrim. Resolved fevers and chills.    Abdominal Wall Cellulitis Patient with hx of recurrent cellulitis of various regions typically responsive to p.o antibiotics; and MRSA cellulitis presenting with progressive left abdominal wall cellulitis (onset on 1/13); despite trial on p.o Bactrim. Superficial,  without crepitus. No systemic symptoms. CT abd with contrast without evident acute intra-abdominal process. Negative nasopharyngeal MRSA colonization per PCR. Blood culture without growth thus far. Pain improving. Partner at home has back problems, but can help with wound care. Magnesium repletion administered today for mild hypomagnesia.   -IV Vancomycin per pharmacy: Adjusted Vancomycin to 1g IV every 12 hours with plans to de-escalate as appropriate. -wound culture pending -f/u blood culture -surgery consulted for possible I&D, appreciate recommendations: Did Not recommend Surgical debridement. Signed off -wound care consulted: Santyl to LLQ abdominal wound in a nickel thick layer. Saline moistened gauze, then dry gauze or ABD pad. Daily change. Monitor.   Hydradenitis Chronic with new bloody, non-purulent drainage left groin.  -wound care   Pseudohyponatremia 2/2 Hyperglycemia in context of Acute Infection--Resolved  Diabetes Patient with A1C of 8.7 in September 2021. A1C 10.3 this admission. On home insulin and compliant on meds. Difficult to afford healthy food.  -pregabalin for neuropathic pain -Appreciate in-patient DM Program recs: Levemir 55 units QHS; Novolog 6 units TID with meals if eats at least 50% of meal  Transaminitis Slightly elevated AST/ALT. She does have history of steatosis on CT 2014 with transaminitis at that time 2/2 intraabdominal bleed after hysterectomy. No prior hepatitis labs. No alcohol use. Contrast CT incidentally without evident focal hepatic abnormality. AST/ALT remains mildly elevated. -continued monitoring   LOS: 1 day   Azell Der, Medical  Student 01/10/2021, 12:32 PM

## 2021-01-10 NOTE — Consult Note (Signed)
WOC Nurse Consult Note: Patient receiving care in [place]. Reason for Consult: abdominal wound Wound type: infectious; surgical eval has been completed--no need for surgery at this time Pressure Injury POA: Yes/No/NA Measurement: 2.5 cm x 6 cm Wound bed: black Drainage (amount, consistency, odor) none Periwound: erythematous Dressing procedure/placement/frequency: Apply Santyl to LLQ abdominal wound in a nickel thick layer. Cover with a saline moistened gauze, then dry gauze or ABD pad.  Change daily. Monitor the wound area(s) for worsening of condition such as: Signs/symptoms of infection,  Increase in size,  Development of or worsening of odor, Development of pain, or increased pain at the affected locations.  Notify the medical team if any of these develop.  Thank you for the consult.  Discussed plan of care with the patient.  [place] nurse will not follow at this time.  Please re-consult the [facility] team if needed.  Val Riles, RN, MSN, CWOCN, CNS-BC, pager [phone]

## 2021-01-10 NOTE — Progress Notes (Signed)
Pharmacy Antibiotic Note  Whitney Wagner is a 45 y.o. female with diabetes who was admitted on 01/09/2021 with abdominal wall cellulitis. Pt was treated with TMP/SMX PTA with some improvement in symptoms; however, pt has worsening pain and necrotic tissue in left lower abdomen. Pharmacy has been consulted for vancomycin dosing.  SCr 0.58, CrCl>100 ml/min, will adjust Vancomycin dose today.  Plan: - Adjust Vancomycin to 1g IV every 12 hours (est AUC 497, Vd 0.72) - Will continue to follow renal function, culture results, LOT, and antibiotic de-escalation plans    Temp (24hrs), Avg:98.5 F (36.9 C), Min:97.6 F (36.4 C), Max:99.5 F (37.5 C)  Recent Labs  Lab 01/08/21 1606 01/10/21 0403 01/10/21 0404  WBC 9.6 8.1  --   CREATININE 0.65  --  0.58    Estimated Creatinine Clearance: 114.9 mL/min (by C-G formula based on SCr of 0.58 mg/dL).    Allergies  Allergen Reactions  . Latex Itching    Mainly tape...states gloves do not bother her much as long as it is not prolonged use.   Anastasio Auerbach [Canagliflozin] Other (See Comments)    Vulvovaginal candidiasis  . Codeine Other (See Comments)    Codeine liquid iv  drip only as a child - caused hallucinations..  Can tolerate percocet, vicodin      Antimicrobials this admission: 1/19 vancomycin >>  Microbiology results: 1/19 COVID, flu A, flu B: negative  Thank you for allowing pharmacy to be a part of this patient's care.  Alycia Rossetti, PharmD, BCPS Clinical Pharmacist Clinical phone for 01/10/2021: [phone] 01/10/2021 2:36 PM   **Pharmacist phone directory can now be found on [email or web address] ([email or web address]).  Listed under [place].

## 2021-01-10 NOTE — Progress Notes (Addendum)
Inpatient Diabetes Program Recommendations  AACE/ADA: New Consensus Statement on Inpatient Glycemic Control (2015)  Target Ranges:  Prepandial:   less than 140 mg/dL      Peak postprandial:   less than 180 mg/dL (1-2 hours)      Critically ill patients:  140 - 180 mg/dL   Lab Results  Component Value Date   GLUCAP 241 (H) 01/10/2021   HGBA1C 10.3 (H) 01/10/2021    Review of Glycemic Control Results for Whitney Wagner, Whitney Wagner (MRN [id number]) as of 01/10/2021 10:02  Ref. Range 01/09/2021 23:59 01/10/2021 08:05  Glucose-Capillary Latest Ref Range: 70 - 99 mg/dL 297 (H) 241 (H)   Diabetes history:  DM2 Outpatient Diabetes medications:  Levemir 50 units QHS Novolog 20 units TID Metformin 1000 mg BID Current orders for Inpatient glycemic control:  Levemir 50 units QHS Novolog 0-20 units TID & 0-5 QHS  Note:   Spoke with patient over the phone this morning.  Reviewed patient's current A1c of 10.3% (average CBG of 250 mg/dL). Explained what a A1c is and what it measures. Also reviewed goal A1c with patient, importance of good glucose control @ home, and blood sugar goals.  She states she is aware and that she hasn't been eating well lately because her partners mother passed away.  She denies difficulties obtaining insulins and supplies.  She checks her CBG in the morning and iit usually reads 250 mg/dL.  She only drinks water.  Encouraged limiting CHO and exercise.  She states she gets plenty of exercise at work and eating healthy is too expensive.  Ordered LWWD booklet.  Addendum @ 1319- Results for Whitney Wagner, Whitney Wagner (MRN [id number]) as of 01/10/2021 13:19  Ref. Range 01/10/2021 08:05 01/10/2021 12:06  Glucose-Capillary Latest Ref Range: 70 - 99 mg/dL 241 (H) 317 (H)   Please consider: Levemir 55 units QHS &  Novolog 6 units TID with meals if eats at least 50% of meal  Will continue to follow while inpatient.  Thank you, Reche Dixon, RN, BSN Diabetes Coordinator Inpatient Diabetes  Program [phone] (team pager from 8a-5p)

## 2021-01-10 NOTE — Progress Notes (Signed)
Internal Medicine Clinic Attending  I saw and evaluated the patient.  I personally confirmed the key portions of the history and exam documented by Dr. Marva Panda and I reviewed pertinent patient test results.  The assessment, diagnosis, and plan were formulated together and I agree with the documentation in the resident's note.   Patient living with diabetes here with persistent purulent skin and soft tissue infection of her left abdominal panus with superficial necrotic changes. No improvement with oral antibiotics. Starting to have some systemic symptoms like fever, but no signs of sepsis. We offered her admission for IV antibiotics and surgery consultation.

## 2021-01-11 ENCOUNTER — Inpatient Hospital Stay: Payer: Self-pay

## 2021-01-11 ENCOUNTER — Other Ambulatory Visit (HOSPITAL_COMMUNITY): Payer: Self-pay | Admitting: Internal Medicine

## 2021-01-11 LAB — HEPATITIS B CORE ANTIBODY, IGM: Hep B C IgM: NONREACTIVE

## 2021-01-11 LAB — GLUCOSE, CAPILLARY
Glucose-Capillary: 242 mg/dL — ABNORMAL HIGH (ref 70–99)
Glucose-Capillary: 226 mg/dL — ABNORMAL HIGH (ref 70–99)
Glucose-Capillary: 203 mg/dL — ABNORMAL HIGH (ref 70–99)
Glucose-Capillary: 313 mg/dL — ABNORMAL HIGH (ref 70–99)

## 2021-01-11 LAB — HEPATITIS B SURFACE ANTIGEN: Hepatitis B Surface Ag: NONREACTIVE

## 2021-01-11 MED ORDER — ALUM & MAG HYDROXIDE-SIMETH 200-200-20 MG/5ML PO SUSP: 30.0000 mL | Freq: Four times a day (QID) | ORAL | Status: DC | PRN

## 2021-01-11 MED ORDER — ENOXAPARIN SODIUM 80 MG/0.8ML ~~LOC~~ SOLN
0.5000 mg/kg | SUBCUTANEOUS | Status: DC
  Administered 2021-01-11: 65 mg via SUBCUTANEOUS
  Filled 2021-01-11: qty 0.8

## 2021-01-11 MED ORDER — POLYETHYLENE GLYCOL 3350 17 G PO PACK
17.0000 g | PACK | Freq: Every day | ORAL | Status: DC
  Filled 2021-01-11: qty 1

## 2021-01-11 MED ORDER — INSULIN DETEMIR 100 UNIT/ML ~~LOC~~ SOLN
25.0000 [IU] | Freq: Two times a day (BID) | SUBCUTANEOUS | Status: DC
  Administered 2021-01-11: 25 [IU] via SUBCUTANEOUS
  Filled 2021-01-11 (×2): qty 0.25

## 2021-01-11 MED ORDER — LINEZOLID 600 MG PO TABS: 600.0000 mg | ORAL_TABLET | Freq: Two times a day (BID) | ORAL | 0 refills | Status: DC

## 2021-01-11 MED ORDER — LINEZOLID 600 MG PO TABS
600.0000 mg | ORAL_TABLET | Freq: Two times a day (BID) | ORAL | Status: DC
  Administered 2021-01-11 – 2021-01-12 (×2): 600 mg via ORAL
  Filled 2021-01-11 (×3): qty 1

## 2021-01-11 MED ORDER — INSULIN DETEMIR 100 UNIT/ML ~~LOC~~ SOLN
30.0000 [IU] | Freq: Two times a day (BID) | SUBCUTANEOUS | Status: DC
  Administered 2021-01-11 – 2021-01-12 (×2): 30 [IU] via SUBCUTANEOUS
  Filled 2021-01-11 (×3): qty 0.3

## 2021-01-11 MED FILL — LINEZOLID 600 MG TABS: 600 | 7 days supply | Qty: 14 | Fill #0

## 2021-01-11 NOTE — Progress Notes (Signed)
Subjective:  Overnight, no acute events.  This morning, she is feeling well. Her pain is improving. She is unable to see her wounds. She does not have fever or chills.   She has been on janumet in the past, but is not sure why this was stopped. She is open to trying whatever medications will help her diabetes. She is having increased urinary frequency.   Objective:  Vital signs in last 24 hours: Vitals:   01/10/21 0207 01/10/21 0645 01/10/21 1400 01/10/21 2303  BP: 116/79 115/73 123/77 134/87  Pulse: 84 83 85 86  Resp: 20 20  18   Temp: 97.6 F (36.4 C) 97.8 F (36.6 C) 98.6 F (37 C) 98.1 F (36.7 C)  TempSrc: Oral  Oral Oral  SpO2: 96% 96% 97% 99%  On room air  Intake/Output Summary (Last 24 hours) at 01/11/2021 0645 Last data filed at 01/11/2021 0438 Gross per 24 hour  Intake 338.41 ml  Output --  Net 338.41 ml  There were no vitals filed for this visit.  Physical Exam Constitutional:      General: She is not in acute distress.    Appearance: Normal appearance. She is obese.  Pulmonary:     Effort: Pulmonary effort is normal. No respiratory distress.  Skin:    Comments: (see media tab) Large erythematous patch with area of central necrosis. Exquisitely tender to palpation.  Neurological:     Mental Status: She is alert.  Psychiatric:        Mood and Affect: Mood normal.        Behavior: Behavior normal.    Labs in last 24 hours: BMP Latest Ref Rng & Units 01/10/2021 01/08/2021 07/27/2019  Glucose 70 - 99 mg/dL 219(H) 297(H) 198(H)  BUN 6 - 20 mg/dL 11 10 11   Creatinine 0.44 - 1.00 mg/dL 0.58 0.65 0.55(L)  BUN/Creat Ratio 9 - 23 - - 20  Sodium 135 - 145 mmol/L 136 132(L) 138  Potassium 3.5 - 5.1 mmol/L 3.8 4.2 4.0  Chloride 98 - 111 mmol/L 102 97(L) 99  CO2 22 - 32 mmol/L 25 22 22   Calcium 8.9 - 10.3 mg/dL 9.1 9.4 9.1  Magnesium - pending Aerobic culture - needs to be collected MRSA culture - needs to be collected Glucose: 266, 273, 317, 241  Imaging in  last 24 hours: No results found.  Assessment/Plan:  Active Problems:   Cellulitis and abscess of trunk  45 y.o female with Hx of HTN, DM (on Insulin, A1C 8.7 in September 2021),  MRSA Cellulitis, Obesity, PCOS, and Hidradenitis Supprativa presenting with progressive abdominal cellulitis following course on bactrim. Resolved fevers and chills.    Abdominal Wall Cellulitis Patient with hx of recurrent cellulitis of various regions typically responsive to p.o antibiotics; and MRSA cellulitis presenting with progressive left abdominal wall cellulitis (onset on 1/13); despite trial on p.o Bactrim. CT w/o abscess, negative MRSA swab. Wound is improving with IV vanc.   -IV Vancomycin - consider around 10 days due to extent of infection and adipose tissue, will need PICC if so -f/u blood culture - NGTD -wound care consulted: Santyl to LLQ abdominal wound in a nickel thick layer. Saline moistened gauze, then dry gauze or ABD pad. Daily change. Monitor.  Diabetes Patient with A1C of 8.7 in September 2021. A1C 10.3 this admission. On home insulin and compliant on meds.  -pregabalin for neuropathic pain - increase levemir from 50U qhs to 30U bid   - novolog 6U tid + resistant SSI -  restart janumet prior to discharge  Hydradenitis Chronic with new bloody, non-purulent drainage left groin.  -wound care  Transaminitis Slightly elevated AST/ALT. She does have history of steatosis on CT 2014 with transaminitis at that time 2/2 intraabdominal bleed after hysterectomy. No prior hepatitis labs. No alcohol use. Contrast CT incidentally without evident focal hepatic abnormality. AST/ALT remains mildly elevated. -HCV, hepatitis labs  - further work-up at follow-up in clinic    LOS: 2 days   Cato Mulligan, MD 01/11/2021, 6:45 AM

## 2021-01-11 NOTE — Progress Notes (Signed)
Pharmacy note - antibiotic stewardship  Discussed with internal medicine team antibiotic options for discharge for abdominal wall cellulitis.  Initial plan was for IV vancomycin via PICC.  Brought up the possibility of using linezolid oral therapy.   Case also discussed with Dr. Baxter Flattery from ID who agreed with trying linezolid.  Plan:  Stop vancomycin. Start linezolid 600mg  po BID.   Rx sent to [facility] to be filled today in case she is discharged over the weekend.  Heide Guile, PharmD, BCPS-AQ [facility] Clinical Pharmacist Phone [phone]

## 2021-01-11 NOTE — Progress Notes (Signed)
[place] for Enoxaparin  Indication: DVT  Allergies  Allergen Reactions  . Latex Itching    Mainly tape...states gloves do not bother her much as long as it is not prolonged use.   Anastasio Auerbach [Canagliflozin] Other (See Comments)    Vulvovaginal candidiasis  . Codeine Other (See Comments)    Codeine liquid iv  drip only as a child - caused hallucinations..  Can tolerate percocet, vicodin      Patient Measurements: Total Body Weight: 127.7 kg Height: 62 inches  Labs: Recent Labs    01/10/21 0403 01/10/21 0404  HGB 12.6  --   HCT 39.9  --   PLT 304  --   CREATININE  --  0.58    Estimated Creatinine Clearance: 114.9 mL/min (by C-G formula based on SCr of 0.58 mg/dL).   Assessment: 45 yr old woman with diabetes was admitted on 01/09/21 with abdominal wall cellulitis. Pt has been receiving heparin 5000 units SQ Q 8 hrs for VTE prophylaxis (last dose today at 1549 PM). Pharmacy has been consulted for enoxaparin dosing for VTE prophylaxis.  H/H, platelets WNL; Scr 0.58; TBW CrCl >> 100 ml/min (renal function stable); BMI 51.49.  Goal of Therapy:  Prevention of VTE Monitor platelets by anticoagulation protocol: Yes   Plan:  Enoxaparin 0.5 mg/kg (65 mg) SQ daily, starting tonight Monitor CBC, renal function Monitor for bleeding  Gillermina Hu, PharmD, BCPS, [facility] Clinical Pharmacist 01/11/2021,7:36 PM

## 2021-01-11 NOTE — Discharge Summary (Signed)
Name: Whitney Wagner MRN: [id number] DOB: 1976/12/13 45 y.o. PCP: Gaylan Gerold, DO  Date of Admission: 01/09/2021  5:41 PM Date of Discharge: 1/22/20221/22/2021 Attending Physician: Dorian Pod   Discharge Diagnosis: 1. Abdominal Cellulitis with Superficial Necrosis  Discharge Medications: Allergies as of 01/12/2021      Reactions   Latex Itching   Mainly tape...states gloves do not bother her much as long as it is not prolonged use.    Invokana [canagliflozin] Other (See Comments)   Vulvovaginal candidiasis   Codeine Other (See Comments)   Codeine liquid iv  drip only as a child - caused hallucinations..  Can tolerate percocet, vicodin        Medication List    STOP taking these medications   clindamycin 1 % external solution Commonly known as: Cleocin-T   cyclobenzaprine 5 MG tablet Commonly known as: FLEXERIL   meloxicam 15 MG tablet Commonly known as: MOBIC   sulfamethoxazole-trimethoprim 800-160 MG tablet Commonly known as: BACTRIM DS     TAKE these medications   atorvastatin 20 MG tablet Commonly known as: LIPITOR TAKE 1 TABLET (20 MG TOTAL) BY MOUTH DAILY.   HYDROcodone-acetaminophen 5-325 MG tablet Commonly known as: NORCO/VICODIN Take 1 tablet by mouth every 8 (eight) hours as needed for up to 5 days for severe pain.   insulin detemir 100 UNIT/ML injection Commonly known as: Levemir Inject 0.32 mLs (32 Units total) into the skin 2 (two) times daily. IM program. What changed:   how much to take  when to take this   Insulin Pen Needle 32G X 4 MM Misc Use to inject Novolog insulin 3 times a day   linezolid 600 MG tablet Commonly known as: ZYVOX Take 1 tablet (600 mg total) by mouth 2 (two) times daily for 7 days.   lisinopril 10 MG tablet Commonly known as: ZESTRIL Take 1 tablet (10 mg total) by mouth daily.   Magnesium 400 MG Caps Take 1 tablet by mouth daily.   metFORMIN 500 MG 24 hr tablet Commonly known as: GLUCOPHAGE-XR Take 2  tablets (1,000 mg total) by mouth 2 (two) times daily with a meal.   NovoLOG FlexPen 100 UNIT/ML FlexPen Generic drug: insulin aspart Inject 20 Units into the skin 3 (three) times daily with meals.   pantoprazole 40 MG tablet Commonly known as: PROTONIX TAKE 1 TABLET (40 MG TOTAL) BY MOUTH DAILY. What changed: See the new instructions.   polyethylene glycol 17 g packet Commonly known as: MIRALAX / GLYCOLAX Take 17 g by mouth daily as needed for moderate constipation.   pregabalin 100 MG capsule Commonly known as: LYRICA Take 1 capsule (100 mg total) by mouth 3 (three) times daily.   vitamin B-12 1000 MCG tablet Commonly known as: CYANOCOBALAMIN Take 1,000 mcg by mouth daily.       Disposition and follow-up:   Whitney Wagner was discharged from [facility] in Stable condition.  At the hospital follow up visit please address:  1.  Abdominal Cellulitis with Necrosis: D/c w/linezolid and norco 5 days. Assess improvement of abdominal infection. May need more abdominal pads or dressings.  2. Recurrent Yeast Infection: please assess if she needs fluconazole for yeast infections that usually occur with antibiotics.   3. TIIDM: Levemir increased to 32U bid, instructed to hold novolog for glucose <90.  Consider starting back on janumet. She is not sure why this was stopped. Should be bringing glucose readings to follow-up.   4. Hypomagnesemia: Mg 1.6. IV  blew right before IV given on day of discharge. PO mag ordered. Assess completion.   2.  Labs / imaging needed at time of follow-up: none  3.  Pending labs/ test needing follow-up: none   Follow-up Appointments:   [facility]  01/15/2021  Hospital Course by problem list: 1. 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 She was on four days of bactrim before presenting back to clinic with worsening infection with 3x5 necrotic abdominal wound with surrounding erythema and induration. She was admitted for treatment with IV Vancomycin. See photos under "media" tab. Surgery was consulted, and CT abdomen was done to assess for abscess, which was normal. She was treated with IV vancomycin for three days before transitioning to linezolid and had continued resolution of infection. She was discharged with linezolid to complete a 10 days course of antibiotics. Wound care was also consulted and she was treated with santyl and discharged with this and instructions for daily dressing changes. She will follow-up in the IMTS clinic to assessment of continued resolution.   2. TIIDM Hemoglobin a1c was elevated to 10.3 at admission. Home medications include metformin 1000 mg bid, levemir 50U qhs and novolog 20U tid. She was switched to levemir 30U bid and continue to have glucoses elevated to the 200s with around 13U novolog per meal. She was discharged with 32U levemir bid and novolog 20U with instructions to hold for glucose <90. Metformin was continued at discharge. We discussed potentially starting janumet again, which she was open to.   Discharge Vitals:   BP 125/80 (BP Location: Right Arm)   Pulse 90   Temp 98.5 F (36.9 C) (Oral)   Resp 18   LMP 07/06/2013   SpO2 98%   Pertinent Labs, Studies, and Procedures:   White blood cell count: 8.1   CT Abdomen 01/13/2021 IMPRESSION: 1. No acute intra-abdominal findings. 2. Stable left adrenal adenoma. 3. Small fat containing umbilical hernia.   Electronically Signed   By: Virgina Norfolk M.D.   On: 01/09/2021 22:22  Discharge Instructions: Discharge Instructions    Diet - low sodium heart healthy   Complete by: As directed    Discharge instructions   Complete by: As directed    You were hospitalized for cellulitis. Thank you for allowing [place] to  be part of your care.   We arranged for you to follow up at:  [place]  They will call you Monday to schedule an appointment If you do not here from them, call [phone]   Please note these changes made to your medications:   Please START taking:   Linezolid - take 1 tablet every 12 hours for 7 days  Norco - take 1 tablet every 8 hours as needed for pain   Increase levemir to 32 Units before breakfast and before dinner. Please keep track of you glucose and bring these readings to clinic at follow-up.   Magnesium - take 1 tablet per day for 7 days. Please be aware that this medication can cause loose stool as a side effect.   Please apply santyl to your wound and change dressings daily with gauze and abdominal pads.   Please STOP taking:   Bactrim   Please call our clinic if you have any questions or concerns, we may be able to  help and keep you from a long and expensive emergency room wait. Our clinic and after hours phone number is [phone], the best time to call is Monday through Friday 9 am to 4 pm but there is always someone available 24/7 if you have an emergency. If you need medication refills please notify your pharmacy one week in advance and they will send [place] a request.   Increase activity slowly   Complete by: As directed       Signed: Molli Hazard A, DO 01/14/2021, 6:18 PM   Pager: [phone]

## 2021-01-12 LAB — BASIC METABOLIC PANEL
Anion gap: 11 (ref 5–15)
BUN: 11 mg/dL (ref 6–20)
CO2: 26 mmol/L (ref 22–32)
Calcium: 9.1 mg/dL (ref 8.9–10.3)
Chloride: 98 mmol/L (ref 98–111)
Creatinine, Ser: 0.64 mg/dL (ref 0.44–1.00)
GFR, Estimated: >60 mL/min (ref >60)
Glucose, Bld: 259 mg/dL — ABNORMAL HIGH (ref 70–99)
Potassium: 3.9 mmol/L (ref 3.5–5.1)
Sodium: 135 mmol/L (ref 135–145)

## 2021-01-12 LAB — GLUCOSE, CAPILLARY
Glucose-Capillary: 226 mg/dL — ABNORMAL HIGH (ref 70–99)
Glucose-Capillary: 291 mg/dL — ABNORMAL HIGH (ref 70–99)

## 2021-01-12 LAB — HEPATITIS B SURFACE ANTIBODY, QUANTITATIVE: Hep B S AB Quant (Post): <3.1 m[IU]/mL — ABNORMAL LOW (ref >9.9)

## 2021-01-12 LAB — CBC
HCT: 40.7 % (ref 36.0–46.0)
Hemoglobin: 13.1 g/dL (ref 12.0–15.0)
MCH: 25.9 pg — ABNORMAL LOW (ref 26.0–34.0)
MCHC: 32.2 g/dL (ref 30.0–36.0)
MCV: 80.6 fL (ref 80.0–100.0)
Platelets: 328 10*3/uL (ref 150–400)
RBC: 5.05 MIL/uL (ref 3.87–5.11)
RDW: 12.3 % (ref 11.5–15.5)
WBC: 8.6 10*3/uL (ref 4.0–10.5)
nRBC: 0 % (ref 0.0–0.2)

## 2021-01-12 LAB — HCV INTERPRETATION

## 2021-01-12 LAB — HCV AB W REFLEX TO QUANT PCR: HCV Ab: <0.1 s/co ratio (ref 0.0–0.9)

## 2021-01-12 LAB — MAGNESIUM: Magnesium: 1.6 mg/dL — ABNORMAL LOW (ref 1.7–2.4)

## 2021-01-12 MED ORDER — POLYETHYLENE GLYCOL 3350 17 G PO PACK: 17.0000 g | PACK | Freq: Every day | ORAL | 0 refills | Status: DC | PRN

## 2021-01-12 MED ORDER — POLYETHYLENE GLYCOL 3350 17 G PO PACK: 17.0000 g | PACK | Freq: Every day | ORAL | Status: DC | PRN

## 2021-01-12 MED ORDER — INSULIN DETEMIR 100 UNIT/ML ~~LOC~~ SOLN: 32.0000 [IU] | Freq: Two times a day (BID) | SUBCUTANEOUS | 11 refills | Status: DC

## 2021-01-12 MED ORDER — HYDROCODONE-ACETAMINOPHEN 5-325 MG PO TABS: 1.0000 | ORAL_TABLET | Freq: Three times a day (TID) | ORAL | 0 refills | Status: AC | PRN

## 2021-01-12 MED ORDER — INSULIN ASPART 100 UNIT/ML ~~LOC~~ SOLN
10.0000 [IU] | Freq: Three times a day (TID) | SUBCUTANEOUS | Status: DC
  Administered 2021-01-12: 10 [IU] via SUBCUTANEOUS

## 2021-01-12 MED ORDER — MAGNESIUM SULFATE 2 GM/50ML IV SOLN
2.0000 g | Freq: Once | INTRAVENOUS | Status: DC
  Filled 2021-01-12: qty 50

## 2021-01-12 MED ORDER — COLLAGENASE 250 UNIT/GM EX OINT
TOPICAL_OINTMENT | Freq: Every day | CUTANEOUS | Status: DC
  Filled 2021-01-12: qty 30

## 2021-01-12 MED ORDER — MAGNESIUM 400 MG PO CAPS: 1.0000 | ORAL_CAPSULE | Freq: Every day | ORAL | 0 refills | Status: AC

## 2021-01-12 NOTE — Progress Notes (Signed)
   Subjective:   She was having some abdominal pain this morning that has improved with pain medication. She has a headache currently.  She is a little apprehensive about taking linezolid as it has caused chest pain, yeast infections, and diarrhea in the past but she is willing to try this again if another medication will not be as effective.   Objective:  Vital signs in last 24 hours: Vitals:   01/10/21 2303 01/11/21 1945 01/11/21 2134 01/12/21 0426  BP: 134/87 (!) 155/70 127/82 125/80  Pulse: 86 89 91 90  Resp: 18 18 18 18   Temp: 98.1 F (36.7 C) 99.2 F (37.3 C) 98.7 F (37.1 C) 98.5 F (36.9 C)  TempSrc: Oral Oral Oral Oral  SpO2: 99% 96% 98% 98%    Constitution: NAD, appears stated age Cardio: regular rate, no LE edema  Respiratory: on room air, non-labored breathing  Abdominal: NTTP, soft, non-distended MSK: moving all extremities Neuro: normal affect, a&ox3 Skin: necrotic abdominal wound with decreased surrounding erythema, small amount of drainage and sloughing, decreasing in overall size.   Assessment/Plan:  Active Problems:   Cellulitis and abscess of trunk  44y.o femalewith Hx of HTN, DM (on Insulin,A1C 8.7 in September 2021), MRSA Cellulitis,Obesity,PCOS,and Hidradenitis Supprativapresenting with progressiveabdominal cellulitisfollowing course on bactrim. Resolved fevers and chills.   Abdominal Wall Cellulitis Patient with hx of recurrent cellulitis of various regions typically responsive to p.o antibiotics; and MRSA cellulitis presenting with progressiveleft abdominal wallcellulitis (onset on 1/13); despite trial on p.o Bactrim. CT w/o abscess, negative MRSA swab. Wound is improving with IV vanc.   -switched to po linezolid yesterday, she has had history of CP, diarrhea, and yeast infections with this. Will discuss daptomycin with pharmacy or social work for affordability.  -blood culture negative  -wound care consulted: Santyl to LLQ abdominal  wound in a nickel thick layer. Saline moistened gauze, then dry gauze or ABD pad. Daily change. Monitor.  Diabetes Patient withA1Cof8.7 in September 2021. A1C 10.3 this admission. On home insulin and compliant on meds. Home meds include levemir 50U qhs and novolog 20U tid and metformin 1000 mg xr bid -pregabalin for neuropathic pain - levemir increased yesterday to 30U bid  - increase novolog to 10U tid + resistant SSI - consider restarting janumet at follow-up  Hydradenitis Chronic with new bloody, non-purulent drainage left groin.  -wound care  Transaminitis Slightly elevated AST/ALT. She does have history of steatosis on CT 2014 with transaminitis at that time 2/2 intraabdominal bleed after hysterectomy. No prior hepatitis labs. No alcohol use. Contrast CT incidentally without evident focal hepatic abnormality. AST/ALT remains mildly elevated. -HCV, hepatitis labs  - further work-up at follow-up in clinic   Hypomagnesemia Mg 1.6. Repleting.  VTE: lovenox  IVF: none Diet: carb modified  Code: full   Dispo: Anticipated discharge today.   Cyncere Ruhe A, DO 01/12/2021, 9:46 AM Pager: [phone] After 5pm on weekdays and 1pm on weekends: On Call Pager: [phone]

## 2021-01-12 NOTE — Plan of Care (Signed)
Patient discharged to home with significant other. Zyvox obtained from pharmacy. Discharge instructions reviewed with the patient.  Problem: Education: Goal: Knowledge of General Education information will improve Description: Including pain rating scale, medication(s)/side effects and non-pharmacologic comfort measures Outcome: Adequate for Discharge   Problem: Health Behavior/Discharge Planning: Goal: Ability to manage health-related needs will improve Outcome: Adequate for Discharge   Problem: Clinical Measurements: Goal: Ability to maintain clinical measurements within normal limits will improve Outcome: Adequate for Discharge Goal: Will remain free from infection Outcome: Adequate for Discharge Goal: Diagnostic test results will improve Outcome: Adequate for Discharge Goal: Respiratory complications will improve Outcome: Adequate for Discharge Goal: Cardiovascular complication will be avoided Outcome: Adequate for Discharge   Problem: Activity: Goal: Risk for activity intolerance will decrease Outcome: Adequate for Discharge   Problem: Nutrition: Goal: Adequate nutrition will be maintained Outcome: Adequate for Discharge   Problem: Coping: Goal: Level of anxiety will decrease Outcome: Adequate for Discharge   Problem: Elimination: Goal: Will not experience complications related to bowel motility Outcome: Adequate for Discharge Goal: Will not experience complications related to urinary retention Outcome: Adequate for Discharge   Problem: Pain Managment: Goal: General experience of comfort will improve Outcome: Adequate for Discharge   Problem: Safety: Goal: Ability to remain free from injury will improve Outcome: Adequate for Discharge   Problem: Skin Integrity: Goal: Risk for impaired skin integrity will decrease Outcome: Adequate for Discharge

## 2021-01-13 ENCOUNTER — Encounter (HOSPITAL_COMMUNITY): Payer: Self-pay | Admitting: Pharmacist

## 2021-01-14 LAB — CULTURE, BLOOD (ROUTINE X 2)
Culture: NO GROWTH
Culture: NO GROWTH
Special Requests: ADEQUATE
Special Requests: ADEQUATE

## 2021-01-15 ENCOUNTER — Other Ambulatory Visit: Payer: Self-pay

## 2021-01-15 ENCOUNTER — Encounter: Payer: Self-pay | Admitting: Internal Medicine

## 2021-01-15 ENCOUNTER — Other Ambulatory Visit: Payer: Self-pay | Admitting: Internal Medicine

## 2021-01-15 ENCOUNTER — Ambulatory Visit: Payer: Self-pay | Admitting: Internal Medicine

## 2021-01-15 VITALS — BP 131/86 | HR 108 | Temp 98.4°F | Ht 62.0 in | Wt 283.1 lb

## 2021-01-15 DIAGNOSIS — K219 Gastro-esophageal reflux disease without esophagitis: Secondary | ICD-10-CM

## 2021-01-15 DIAGNOSIS — I10 Essential (primary) hypertension: Secondary | ICD-10-CM

## 2021-01-15 DIAGNOSIS — L03319 Cellulitis of trunk, unspecified: Secondary | ICD-10-CM

## 2021-01-15 DIAGNOSIS — E785 Hyperlipidemia, unspecified: Secondary | ICD-10-CM

## 2021-01-15 DIAGNOSIS — L02219 Cutaneous abscess of trunk, unspecified: Secondary | ICD-10-CM

## 2021-01-15 DIAGNOSIS — L02211 Cutaneous abscess of abdominal wall: Secondary | ICD-10-CM

## 2021-01-15 DIAGNOSIS — E1169 Type 2 diabetes mellitus with other specified complication: Secondary | ICD-10-CM

## 2021-01-15 MED ORDER — ATORVASTATIN CALCIUM 20 MG PO TABS: 20.0000 mg | ORAL_TABLET | Freq: Every day | ORAL | 3 refills | Status: DC

## 2021-01-15 MED ORDER — LEVEMIR FLEXTOUCH 100 UNIT/ML ~~LOC~~ SOPN: 35.0000 [IU] | PEN_INJECTOR | Freq: Two times a day (BID) | SUBCUTANEOUS | 11 refills | Status: DC

## 2021-01-15 MED ORDER — INSULIN DETEMIR 100 UNIT/ML ~~LOC~~ SOLN: 35.0000 [IU] | Freq: Two times a day (BID) | SUBCUTANEOUS | 11 refills | Status: DC

## 2021-01-15 MED ORDER — PANTOPRAZOLE SODIUM 40 MG PO TBEC: 40.0000 mg | DELAYED_RELEASE_TABLET | Freq: Every day | ORAL | 0 refills | Status: DC

## 2021-01-15 MED ORDER — NOVOLOG FLEXPEN 100 UNIT/ML ~~LOC~~ SOPN: 25.0000 [IU] | PEN_INJECTOR | Freq: Three times a day (TID) | SUBCUTANEOUS | 5 refills | Status: DC

## 2021-01-15 MED ORDER — LISINOPRIL 10 MG PO TABS
10.0000 mg | ORAL_TABLET | Freq: Every day | ORAL | 4 refills | Status: DC
Start: 2021-01-15 — End: 2021-01-15

## 2021-01-15 MED ORDER — JANUMET 50-1000 MG PO TABS: 1.0000 | ORAL_TABLET | Freq: Two times a day (BID) | ORAL | 0 refills | Status: DC

## 2021-01-15 MED FILL — LISINOPRIL 10 MG TABS: 10 | 30 days supply | Qty: 30 | Fill #0

## 2021-01-15 MED FILL — LEVEMIR FLEXTOUCH 100 UNITS: 100 | 30 days supply | Qty: 21 | Fill #0

## 2021-01-15 MED FILL — JANUMET 50-1,000 MG TABLET: 50-1000 | 30 days supply | Qty: 60 | Fill #0

## 2021-01-15 MED FILL — NOVOLOG FLEXPEN SYRINGE: 100 | 28 days supply | Qty: 21 | Fill #0

## 2021-01-15 MED FILL — ATORVASTATIN CALCIUM 20 MG: 20 | 30 days supply | Qty: 30 | Fill #0

## 2021-01-15 MED FILL — PANTOPRAZOLE SOD DR 40 MG T: 40 | 30 days supply | Qty: 30 | Fill #0

## 2021-01-15 NOTE — Patient Instructions (Addendum)
Whitney Wagner,  It was a pleasure seeing you in clinic. Today we discussed:   I am sending a referral to the wound care center. Please ensure that you complete your course of antibiotics.   Diabetes:  At this time please increase your Levemir to 35U twice daily. You may also increase your mealtime Novolog to 25U for goal blood sugar of <180 for optimal wound healing.   If you have any questions or concerns, please call our clinic at [phone] between 9am-5pm and after hours call [phone] and ask for the internal medicine resident on call. If you feel you are having a medical emergency please call 911.   Thank you, we look forward to helping you remain healthy!  To schedule an appointment for a COVID vaccine or be added to the vaccine wait list: Go to [email or web address]   OR Go to [email or web address]                  OR Call [phone]                                     OR Call [phone] and select Option 2

## 2021-01-15 NOTE — Progress Notes (Signed)
° °  CC: f/u hospitalization for abdominal wall wound   HPI:  Whitney Wagner is a 45 y.o. female with PMHx as stated below presenting for follow up of her recent hospitalization for abdominal wall cellulitis and superficial necrosis. Patient was treated with IV vancomycin with improvement in symptoms. No I&D performed. Patient discharged with linezolid. She is doing well although does endorse nausea and blurry vision with the linezolid but is willing to complete treatment course. Denies any fevers/chills but does express frustration with no debridement of necrotic tissue. Does have some continued drainage from site. Please see problem based charting for complete assessment and plan.  Past Medical History:  Diagnosis Date   Arthritis    "beginnings in my hands" (05/26/2013)   Diabetes mellitus without complication ([place]) 3762   GERD (gastroesophageal reflux disease)    otc meds prn   Migraines, neuralgic    '~ 3/week" (05/26/2013) - otc meds prn   Morbid obesity ([place])    Osgood-Schlatter's disease of left knee    left knee   PCOS (polycystic ovarian syndrome)    Seasonal allergies    Tooth pain 05/26/2013   "bottom right" (05/26/2013)   Uterine fibroid    Review of Systems:  Negative except as stated in HPI   Physical Exam:  Vitals:   01/15/21 1440  BP: 131/86  Pulse: (!) 108  Temp: 98.4 F (36.9 C)  SpO2: 99%  Weight: 283 lb 1.6 oz (128.4 kg)  Height: 5\' 2"  (1.575 m)   Physical Exam  Constitutional: obese female. No distress.  Abdomen: Nondistended, soft, nontender to palpation, active bowel sounds; 3x5cm lesion with area of superficial necrosis; some purulent discharge noted on abdominal pads Musculoskeletal: Normal bulk and tone.  No peripheral edema noted. Skin: Warm and dry.  No rash, erythema, see picture below for abdominal lesion  Psychiatric: Normal mood and affect. Behavior is normal. Judgment and thought content normal.       Assessment & Plan:   See  Encounters Tab for problem based charting.  Patient discussed with Dr. Evette Doffing

## 2021-01-16 ENCOUNTER — Telehealth: Payer: Self-pay

## 2021-01-16 ENCOUNTER — Telehealth: Payer: Self-pay | Admitting: Student

## 2021-01-16 ENCOUNTER — Telehealth: Payer: Self-pay | Admitting: *Deleted

## 2021-01-16 ENCOUNTER — Other Ambulatory Visit: Payer: Self-pay | Admitting: Internal Medicine

## 2021-01-16 DIAGNOSIS — M25531 Pain in right wrist: Secondary | ICD-10-CM

## 2021-01-16 MED ORDER — MELOXICAM 7.5 MG PO TBDP: 7.5000 mg | ORAL_TABLET | Freq: Every day | ORAL | 0 refills | Status: DC | PRN

## 2021-01-16 NOTE — Assessment & Plan Note (Signed)
Patient's most recent A1c 10.3.  She was previously on Levemir 50 units daily, NovoLog 20 units 3 times daily and Metformin 1000 mg twice daily.  During her hospitalization, patient on 3 Levemir 32 units twice daily and continuation of her short acting insulin.  Patient notes improved glycemic control with average CBG in the 250s.  At this time given significant abdominal wound necrosis, patient would benefit from tighter glucose control to optimize wound healing.  Plan: Increase Levemir to 35 units twice daily Increase NovoLog to 25 units 3 times daily Discontinue Metformin 1000 mg twice daily Start Janumet 50-1000 mg twice daily Follow-up in 2 weeks for glucose monitoring and insulin adjustment

## 2021-01-16 NOTE — Assessment & Plan Note (Signed)
BP Readings from Last 3 Encounters:  01/15/21 131/86  01/12/21 125/80  01/09/21 108/71   Plan: Continue lisinopril 10mg  daily

## 2021-01-16 NOTE — Assessment & Plan Note (Signed)
Ms Whitney Wagner is presenting for follow up of her recent hospitalization for abdominal wall cellulitis and necrotic wound. She was admitted on 1/19 with abdominal wall cellulitis and 3x5 area of wound necrosis.  She was treated with IV antibiotics and surgery was consulted for possible debridement.  CT abdomen pelvis was negative for deep abscess and I&D was deferred.  Patient was discharged on linezolid.  She was also recommended to continue using Santyl on the wound and continue abdominal wound dressing.  Patient has continued taking linezolid and has 3 more days remaining of treatment.  She does endorse blurry vision and nausea and weakness reports is tolerable. Patient expressed frustration with not undergoing debridement during her hospitalization.  On examination, continued 3 x 5 area of superficial necrosis drainage noted on the abdominal pad.  No significant surrounding erythema erythema as noted in the picture above.  Discussed possible debridement via wound care center vs [facility] nurse.  Given significant financial restraints and lack of insurance, options are limited.  Plan: Continue linezolid to complete treatment Continue wound care as recommended by [facility] nurse Referral to wound care center Improved glycemic control with goal blood glucose less than 180 for optimal wound healing

## 2021-01-16 NOTE — Telephone Encounter (Signed)
ERROR

## 2021-01-16 NOTE — Telephone Encounter (Signed)
Call frompt requesting refill on Meloxicam 15 mg daily. States she saw Dr Marva Panda yesterday and was told it would be refilled. Not on current med list. Thanks

## 2021-01-16 NOTE — Telephone Encounter (Signed)
Rec'd call from the patient this morning stating, "Pls cancel the Referral to the [facility] because I have cut the   abscess out last night myself.  I am feeling better.  No need to go the [facility]."  Please Advise in reference to the Referral placed.

## 2021-01-17 MED FILL — MELOXICAM 7.5 MG TABLET: 7.5 | 30 days supply | Qty: 30 | Fill #0

## 2021-01-17 NOTE — Progress Notes (Signed)
Internal Medicine Clinic Attending ° °Case discussed with Dr. Aslam  At the time of the visit.  We reviewed the resident’s history and exam and pertinent patient test results.  I agree with the assessment, diagnosis, and plan of care documented in the resident’s note.  °

## 2021-01-17 NOTE — Telephone Encounter (Signed)
Done per Dr Marva Panda.

## 2021-01-21 ENCOUNTER — Other Ambulatory Visit: Payer: Self-pay | Admitting: Internal Medicine

## 2021-01-21 ENCOUNTER — Telehealth: Payer: Self-pay | Admitting: Student

## 2021-01-21 MED ORDER — KETOCONAZOLE 2 % EX CREA: 1.0000 "application " | TOPICAL_CREAM | Freq: Every day | CUTANEOUS | 0 refills | Status: DC

## 2021-01-21 NOTE — Telephone Encounter (Signed)
I agree with having her evaluated sooner. Will send in for ketoconazole cream for external use for some relief.

## 2021-01-21 NOTE — Telephone Encounter (Signed)
Patient calling to report she finished her antibiotics on 01/17/2021 and now she has a Yeast infection internal and external on her vaginal area, buttocks, under stomach and bra area.  Pt is itching and burning.  Pleas call back.

## 2021-01-22 MED FILL — KETOCONAZOLE 2% CREAM: 2 | 7 days supply | Qty: 15 | Fill #0

## 2021-01-22 NOTE — Telephone Encounter (Signed)
Spoke with the patient.  Appt has been moved to 01/23/2021 @ 1:45 pm with Dr. Charleen Kirks.

## 2021-01-23 ENCOUNTER — Other Ambulatory Visit: Payer: Self-pay | Admitting: Internal Medicine

## 2021-01-23 ENCOUNTER — Encounter: Payer: Self-pay | Admitting: Internal Medicine

## 2021-01-23 ENCOUNTER — Other Ambulatory Visit: Payer: Self-pay

## 2021-01-23 ENCOUNTER — Ambulatory Visit (INDEPENDENT_AMBULATORY_CARE_PROVIDER_SITE_OTHER): Payer: Self-pay | Admitting: Internal Medicine

## 2021-01-23 VITALS — BP 114/67 | HR 102 | Temp 98.2°F | Ht 62.0 in | Wt 281.6 lb

## 2021-01-23 DIAGNOSIS — B3731 Acute candidiasis of vulva and vagina: Secondary | ICD-10-CM

## 2021-01-23 DIAGNOSIS — E1169 Type 2 diabetes mellitus with other specified complication: Secondary | ICD-10-CM

## 2021-01-23 DIAGNOSIS — L304 Erythema intertrigo: Secondary | ICD-10-CM

## 2021-01-23 DIAGNOSIS — L03311 Cellulitis of abdominal wall: Secondary | ICD-10-CM

## 2021-01-23 DIAGNOSIS — Z794 Long term (current) use of insulin: Secondary | ICD-10-CM

## 2021-01-23 DIAGNOSIS — Z7984 Long term (current) use of oral hypoglycemic drugs: Secondary | ICD-10-CM

## 2021-01-23 DIAGNOSIS — B373 Candidiasis of vulva and vagina: Secondary | ICD-10-CM

## 2021-01-23 MED ORDER — FLUCONAZOLE 150 MG PO TABS: 150.0000 mg | ORAL_TABLET | ORAL | 0 refills | Status: DC

## 2021-01-23 MED ORDER — HYDROCODONE-ACETAMINOPHEN 5-325 MG PO TABS: 1.0000 | ORAL_TABLET | Freq: Two times a day (BID) | ORAL | 0 refills | Status: DC

## 2021-01-23 MED ORDER — NOVOLOG FLEXPEN 100 UNIT/ML ~~LOC~~ SOPN: 8.0000 [IU] | PEN_INJECTOR | Freq: Three times a day (TID) | SUBCUTANEOUS | 5 refills | Status: DC

## 2021-01-23 MED ORDER — KETOCONAZOLE 2 % EX CREA: 1.0000 "application " | TOPICAL_CREAM | Freq: Every day | CUTANEOUS | 3 refills | Status: DC

## 2021-01-23 MED FILL — HYDROCODON-APAP 5-325: 5-325 | 5 days supply | Qty: 10 | Fill #0

## 2021-01-23 MED FILL — FLUCONAZOLE 150 MG TABS: 150 | 9 days supply | Qty: 3 | Fill #0

## 2021-01-23 NOTE — Patient Instructions (Signed)
It was nice seeing you today! Thank you for choosing [facility] for your Primary Care.    Today we talked about:   1. Wound on your abdomen: Continue keeping it covered and clean. It looks like it is on the path to healing! I have also sent in another short course of pain medication to the pharmacy.   2. Diabetes: No medication changes were made today. Keep up the great work with the carb counting and sliding scale of Novolog. For best wound healing, our goal is to keep your sugar under 180 at all times.   3. Yeast infections: I sent in Diflucan for the vaginal yeast infection and Dr. Marva Panda had already sent in Ketoconazole for your skin infections.    Follow up in 2-3 weeks to see how your wound is doing!

## 2021-01-23 NOTE — Progress Notes (Addendum)
   CC: Abdominal wall wound; diabetes  HPI:  Whitney Wagner is a 45 y.o. with a PMHx as listed below who presents to the clinic for abdominal wall wound; diabetes.   Please see the Encounters tab for problem-based Assessment & Plan regarding status of patient's acute and chronic conditions.  Past Medical History:  Diagnosis Date  . Arthritis    "beginnings in my hands" (05/26/2013)  . Diabetes mellitus without complication ([place]) 7106  . GERD (gastroesophageal reflux disease)    otc meds prn  . Migraines, neuralgic    '~ 3/week" (05/26/2013) - otc meds prn  . Morbid obesity ([place])   . Osgood-Schlatter's disease of left knee    left knee  . PCOS (polycystic ovarian syndrome)   . Seasonal allergies   . Sinusitis, acute maxillary 07/06/2020  . Tooth pain 05/26/2013   "bottom right" (05/26/2013)  . Uterine fibroid    Review of Systems: Review of Systems  Constitutional: Negative for chills and fever.  Genitourinary: Negative for dysuria.       + vaginal itching, discharge  Skin: Positive for itching and rash.       + abdominal wound   Physical Exam:  Vitals:   01/23/21 1340  BP: 114/67  Pulse: (!) 102  Temp: 98.2 F (36.8 C)  TempSrc: Oral  SpO2: 97%  Weight: 281 lb 9.6 oz (127.7 kg)  Height: 5\' 2"  (1.575 m)   Physical Exam Vitals and nursing note reviewed.  Constitutional:      General: She is not in acute distress.    Appearance: She is obese.  HENT:     Head: Normocephalic and atraumatic.  Pulmonary:     Effort: Pulmonary effort is normal. No respiratory distress.  Skin:    General: Skin is warm and dry.     Comments: Diffuse erythema in the breast folds and abdominal folds with some satellite lesions. No purulent drainage. No abscess.   Neurological:     General: No focal deficit present.     Mental Status: She is alert and oriented to person, place, and time. Mental status is at baseline.  Psychiatric:        Mood and Affect: Mood normal.        Behavior:  Behavior normal.        Assessment & Plan:   See Encounters Tab for problem based charting.  Patient discussed with Dr. Evette Doffing

## 2021-01-24 DIAGNOSIS — L304 Erythema intertrigo: Secondary | ICD-10-CM | POA: Insufficient documentation

## 2021-01-24 NOTE — Assessment & Plan Note (Signed)
Whitney Wagner states that a few days ago, she debrided her own wound and since then the healing appears improved. She denies any purulent drainage or worsening erythema. She has been having significant pain though given that she works at [employer] that involves a lot of rocking back and forth that irritates that particular location. She was requesting another prescription of pain medication.  Assessment/plan: Wound appears to be healing well. No evidence of ongoing infection at this time. Patient has completed her antibiotic course. We will provide another 5-day course of Norco however I advised that we cannot prescribe it for any longer given the stop act.  -2 to 3-week follow-up -Norco 5-325 mg twice daily as needed for 5 days

## 2021-01-24 NOTE — Assessment & Plan Note (Addendum)
Diffuse intertrigo secondary to recent antibiotic use.  -Ketoconazole 2% cream apply once daily for up to 2 to 4 weeks until rash resolves

## 2021-01-24 NOTE — Assessment & Plan Note (Signed)
Whitney Wagner endorses vaginal itching and white thick vaginal discharge consistent with her previous candidal infections. She notes that this tends to always happen when she is on antibiotics.  Assessment/plan: Consistent with vaginal candidiasis, Pap smear in the past supports previous candidal infections in similar situations.  -Diflucan 150 mg, take 1 tablet every 3 days for 3 doses total

## 2021-01-24 NOTE — Assessment & Plan Note (Addendum)
[name] states that she continues to take her Levemir 35 units twice a day, however she is been having some difficulty with her NovoLog. She states that her last appointment she was told to take up to 25 units 3 times a day with meals but was not explained on how to determine when to take 25 or when to take less. She ended up taking 25 units before meal and subsequently dropped to a CBG of 66; she was very symptomatic and this scared her quite a bit. She has a friend who is a type I diabetic and dietitian who helped her learn how to carb count. Since then, she has been using a carb counting method to determine her NovoLog use; averaging 8 to 10 units with each meal. She has not experienced any more episodes of hypoglycemia. She brought her glucometer in with her today, however our computer is unable to download it.  Assessment/plan: There is quite a bit of discrepancy between her NovoLog dose and her Levemir dose, so I do suspect that the 8 to 10 units 3 times a day is not sufficient, however given patient's episode of hypoglycemia, recommended she continue with current method.  -Janumet 1 tablet twice daily -Levemir 35 units twice daily -NovoLog 8 to 10 units 3 times a day with meals with carb counting -Follow-up with PCP for further titration

## 2021-01-28 NOTE — Progress Notes (Signed)
Internal Medicine Clinic Attending  Case discussed with Dr. Basaraba  At the time of the visit.  We reviewed the resident's history and exam and pertinent patient test results.  I agree with the assessment, diagnosis, and plan of care documented in the resident's note.  

## 2021-01-29 ENCOUNTER — Encounter: Payer: No Typology Code available for payment source | Admitting: Internal Medicine

## 2021-01-30 ENCOUNTER — Other Ambulatory Visit: Payer: Self-pay | Admitting: Student

## 2021-01-30 ENCOUNTER — Ambulatory Visit: Payer: Self-pay | Admitting: Student

## 2021-01-30 DIAGNOSIS — L0591 Pilonidal cyst without abscess: Secondary | ICD-10-CM

## 2021-01-30 MED ORDER — DOXYCYCLINE MONOHYDRATE 100 MG PO TABS: 100.0000 mg | ORAL_TABLET | Freq: Two times a day (BID) | ORAL | 0 refills | Status: DC

## 2021-01-30 MED ORDER — HYDROCODONE-ACETAMINOPHEN 5-325 MG PO TABS: 1.0000 | ORAL_TABLET | Freq: Two times a day (BID) | ORAL | 0 refills | Status: DC | PRN

## 2021-01-30 MED FILL — DOXYCYCLINE MONO 100 MG TAB: 100 | 7 days supply | Qty: 14 | Fill #0

## 2021-01-30 MED FILL — HYDROCODON-APAP 5-325: 5-325 | 5 days supply | Qty: 10 | Fill #0

## 2021-01-30 NOTE — Patient Instructions (Signed)
Ms Lague,  It was a pleasure seeing you in the clinic today.   We discussed the following today:  1. I have sent an antibiotic called doxycycline for you to take. Please take it twice daily as prescribed for the next 7 days.  2. I have prescribed a 5-day course of norco for pain management.  Please call our clinic at [phone] if you have any questions or concerns. The best time to call is Monday-Friday from 9am-4pm, but there is someone available 24/7 at the same number. If you need medication refills, please notify your pharmacy one week in advance and they will send [place] a request.   Thank you for letting us take part in your care. We look forward to seeing you next time!

## 2021-01-30 NOTE — Progress Notes (Signed)
   CC: pilonidal cyst  HPI:  [name] is a 45 y.o. F with history listed below presenting to the [facility] for reported pilonidal cyst. Please see individualized problem based charting for full HPI.  Past Medical History:  Diagnosis Date  . Arthritis    "beginnings in my hands" (05/26/2013)  . Diabetes mellitus without complication ([place]) 7517  . GERD (gastroesophageal reflux disease)    otc meds prn  . Migraines, neuralgic    '~ 3/week" (05/26/2013) - otc meds prn  . Morbid obesity ([place])   . Osgood-Schlatter's disease of left knee    left knee  . PCOS (polycystic ovarian syndrome)   . Seasonal allergies   . Sinusitis, acute maxillary 07/06/2020  . Tooth pain 05/26/2013   "bottom right" (05/26/2013)  . Uterine fibroid     Review of Systems:  Negative aside from that listed in individualized problem based charting.  Physical Exam:  Vitals:   01/30/21 1354  BP: (!) 147/89  Pulse: (!) 108  Temp: 98.8 F (37.1 C)  TempSrc: Oral  SpO2: 97%  Weight: 283 lb 9.6 oz (128.6 kg)   Physical Exam Constitutional:      Appearance: She is obese. She is not ill-appearing.  HENT:     Mouth/Throat:     Mouth: Mucous membranes are moist.     Pharynx: Oropharynx is clear. No oropharyngeal exudate.  Eyes:     Extraocular Movements: Extraocular movements intact.     Conjunctiva/sclera: Conjunctivae normal.     Pupils: Pupils are equal, round, and reactive to light.  Cardiovascular:     Rate and Rhythm: Normal rate and regular rhythm.     Pulses: Normal pulses.     Heart sounds: Normal heart sounds. No murmur heard. No friction rub. No gallop.   Pulmonary:     Effort: Pulmonary effort is normal.     Breath sounds: Normal breath sounds. No wheezing, rhonchi or rales.  Abdominal:     General: Bowel sounds are normal. There is no distension.     Palpations: Abdomen is soft.     Tenderness: There is no abdominal tenderness.  Genitourinary:    Comments: Cystic nodule in the gluteal  cleft located on the left buttock. TTP palpation at site and directly around it. Redness and erythema surrounding cyst. No drainage, fluctuance, or sinus tracts noted. Skin:    General: Skin is warm and dry.  Neurological:     General: No focal deficit present.     Mental Status: She is alert and oriented to person, place, and time.  Psychiatric:        Mood and Affect: Mood normal.        Behavior: Behavior normal.      Assessment & Plan:   See Encounters Tab for problem based charting.  Patient seen with Dr. Evette Doffing

## 2021-01-30 NOTE — Assessment & Plan Note (Signed)
Patient presenting with 3-day history of pilonidal cyst on left buttock. States that this happened before 1 year ago and was drained with subsequent resolution. States that highest home temp was 99.38F, but otherwise no other systemic symptoms. On exam, patient with cystic nodule in the gluteal cleft on the left buttock with surrounding redness and erythema. No fluctuance, drainage, or sinus tracts noted.   Patient does have a history of uncontrolled diabetes. Reports that her sugars have been controlled to about 160s for past couple of weeks but recently (last 3-4 days) have been up to 250s-300s. Discussed that drainage may not be appropriate at this time as patient has poor wound healing from uncontrolled diabetes in the past and this would likely also heal poorly. Discussed using oral antibiotics with doxycycline 100mg  BID for 7 days. Will also provide 5 day course of norco 5-325mg  for acute pain control.   Plan: -doxycycline 100mg  BID for 7 days -norco 5-325mg  BID prn for 5 days -f/u in 1 month

## 2021-01-31 NOTE — Progress Notes (Signed)
Internal Medicine Clinic Attending  I saw and evaluated the patient.  I personally confirmed the key portions of the history and exam documented by Dr. Allyson Sabal and I reviewed pertinent patient test results.  The assessment, diagnosis, and plan were formulated together and I agree with the documentation in the resident's note.   Few days of an inflammed left upper gluteal nodule with surrounding errythema, makes it hard for her to sit. It could be an early abscess or infected dermal cyst (a little off midline to be pilonidal, maybe inclusion). The patient has poor wound healing due to uncontrolled diabetes, no systemic symptoms, so I advised against incision and drainage at this time. Rather, would treat with antibiotics and give it more time. We talked more about the need to control her blood sugar with mealtime insulin and diet modifications.

## 2021-02-04 ENCOUNTER — Other Ambulatory Visit: Payer: Self-pay | Admitting: Dietician

## 2021-02-04 DIAGNOSIS — Z794 Long term (current) use of insulin: Secondary | ICD-10-CM

## 2021-02-04 DIAGNOSIS — E1169 Type 2 diabetes mellitus with other specified complication: Secondary | ICD-10-CM

## 2021-02-04 MED ORDER — FREESTYLE LIBRE 2 SENSOR MISC: 12 refills | Status: DC

## 2021-02-04 NOTE — Telephone Encounter (Signed)
Whitney Wagner request prescription for [facility] 2 sensors.

## 2021-02-06 ENCOUNTER — Other Ambulatory Visit: Payer: Self-pay | Admitting: Dietician

## 2021-02-06 DIAGNOSIS — E1169 Type 2 diabetes mellitus with other specified complication: Secondary | ICD-10-CM

## 2021-02-06 MED ORDER — FREESTYLE LIBRE 14 DAY READER DEVI: 0 refills | Status: DC

## 2021-02-06 NOTE — Telephone Encounter (Signed)
[name] requests reader for Whitney Wagner 14 day because that is what type of sensor the pharmacy gave her by mistake. Her phone is not compatible.

## 2021-02-12 ENCOUNTER — Other Ambulatory Visit: Payer: Self-pay | Admitting: Internal Medicine

## 2021-02-12 MED FILL — LEVEMIR FLEXTOUCH 100 UNITS: 100 | 30 days supply | Qty: 21 | Fill #1

## 2021-02-12 MED FILL — LISINOPRIL 10 MG TABS: 10 | 30 days supply | Qty: 30 | Fill #1

## 2021-02-12 MED FILL — PANTOPRAZOLE SOD DR 40 MG T: 40 | 30 days supply | Qty: 30 | Fill #1

## 2021-02-12 MED FILL — NOVOLOG FLEXPEN SYRINGE: 100 | 28 days supply | Qty: 21 | Fill #1

## 2021-02-12 MED FILL — ATORVASTATIN CALCIUM 20 MG: 20 | 30 days supply | Qty: 30 | Fill #1

## 2021-02-13 ENCOUNTER — Other Ambulatory Visit: Payer: Self-pay | Admitting: Internal Medicine

## 2021-02-13 MED FILL — JANUMET 50-1,000 MG TABLET: 50-1000 | 30 days supply | Qty: 60 | Fill #0

## 2021-02-14 ENCOUNTER — Ambulatory Visit: Payer: Self-pay | Admitting: Internal Medicine

## 2021-02-14 ENCOUNTER — Encounter: Payer: Self-pay | Admitting: Internal Medicine

## 2021-02-14 ENCOUNTER — Other Ambulatory Visit: Payer: Self-pay

## 2021-02-14 ENCOUNTER — Other Ambulatory Visit (HOSPITAL_COMMUNITY): Payer: Self-pay | Admitting: Internal Medicine

## 2021-02-14 VITALS — BP 120/67 | HR 109 | Temp 98.9°F | Ht 62.0 in | Wt 282.7 lb

## 2021-02-14 DIAGNOSIS — R000 Tachycardia, unspecified: Secondary | ICD-10-CM

## 2021-02-14 DIAGNOSIS — Z794 Long term (current) use of insulin: Secondary | ICD-10-CM

## 2021-02-14 DIAGNOSIS — L0591 Pilonidal cyst without abscess: Secondary | ICD-10-CM

## 2021-02-14 DIAGNOSIS — M8949 Other hypertrophic osteoarthropathy, multiple sites: Secondary | ICD-10-CM

## 2021-02-14 DIAGNOSIS — L03311 Cellulitis of abdominal wall: Secondary | ICD-10-CM

## 2021-02-14 DIAGNOSIS — M1990 Unspecified osteoarthritis, unspecified site: Secondary | ICD-10-CM | POA: Insufficient documentation

## 2021-02-14 DIAGNOSIS — E1169 Type 2 diabetes mellitus with other specified complication: Secondary | ICD-10-CM

## 2021-02-14 DIAGNOSIS — M159 Polyosteoarthritis, unspecified: Secondary | ICD-10-CM

## 2021-02-14 MED ORDER — MELOXICAM 15 MG PO TBDP: 15.0000 mg | ORAL_TABLET | Freq: Every day | ORAL | 2 refills | Status: DC | PRN

## 2021-02-14 MED FILL — MELOXICAM 15 MG TABLET: 15 | 30 days supply | Qty: 30 | Fill #0

## 2021-02-14 NOTE — Patient Instructions (Addendum)
It was nice seeing you today! Thank you for choosing [facility] for your Primary Care.    Today we talked about:   1. I have added back Meloxicam 15 mg to take in the morning as needed. Try to have periodic holidays from the medication, such as on the weekends.  2. Transition back to using your Novolog 10-15 minute before you eat breakfast. If it continues to be above 180, we may need to increase your breakfast coverage.   3. I am glad to hear all the wounds healed!

## 2021-02-14 NOTE — Assessment & Plan Note (Signed)
Whitney Wagner states that she has a past medical history of osteoarthritis of multiple joints, including her bilateral hips.  For this she has been on meloxicam for many years and feels this controls her pain well.  She is requesting a refill today.  Assessment/plan: I suspect her osteoarthritis is in part worsened by her morbid obesity.  She may benefit from consideration of bariatric surgery in the future.  For now we will refill her meloxicam.  -Meloxicam 15 mg daily as needed

## 2021-02-14 NOTE — Assessment & Plan Note (Signed)
Ms. Corsi states that when she arrived home after her appointment, she did have her partner implants the pilonidal cyst with a insulin needle, although it did not initially drain afterwards.  After that she cover the area and a warm compress and it drained spontaneously.  Since then, the area has healed completely.  Assessment/plan: Resolved

## 2021-02-14 NOTE — Assessment & Plan Note (Signed)
Whitney Wagner states that she has a history of tachycardia when coming to physician visits.  She notes that she checks her blood pressure and heart rate at home and they are within normal limits.  She denies any palpitations or chest pain.  She notes that her father does have a history of sinus tachycardia as well.  Assessment/plan: Previous EKG in the clinic showed sinus tachycardia.  I suspect this may be in part due to deconditioning. I have requested that patient bring her home records to her next appointment so we can see how her heart rate is at home.  -Continue to monitor

## 2021-02-14 NOTE — Assessment & Plan Note (Signed)
Well-healed at this time without any evidence of continuing infection.

## 2021-02-14 NOTE — Assessment & Plan Note (Signed)
Whitney Wagner states she has been doing well in terms of her diabetes, although in the past 3 days she did notice that her morning sugars have been spiking.  She had her continuous glucose monitor placed approximately 5 days ago.  Results were reviewed with her today, and for the past 3 days, her sugars between 10 AM and noon are in the mid 200s. Otherwise, her sugars average at 190.  She notes that she has started to take her NovoLog around 7:30 AM although she eats around 8:30-9.  No other changes to her diet.  She denies any hypoglycemic events.  Assessment/plan: We discussed adjusting her morning NovoLog to be taken approximately 15 minutes before she eats.  Overall, it seems her diabetes is much better controlled compared to January and I suspect that her A1c will come down nicely.  -No medication dosage changes made today -NovoLog to be taken 15 minutes before meal -25-month follow-up

## 2021-02-14 NOTE — Progress Notes (Signed)
   CC: T2DM, Arthritis, Pilonidal cyst, cellulitis   HPI:  Whitney Wagner is a 45 y.o. with a PMHx as listed below who presents to the clinic for T2DM, Arthritis, Pilonidal cyst, cellulitis .   Please see the Encounters tab for problem-based Assessment & Plan regarding status of patient's acute and chronic conditions.  Past Medical History:  Diagnosis Date  . Arthritis    "beginnings in my hands" (05/26/2013)  . Diabetes mellitus without complication ([place]) 2831  . GERD (gastroesophageal reflux disease)    otc meds prn  . Golfer's elbow, right 11/06/2017  . Migraines, neuralgic    '~ 3/week" (05/26/2013) - otc meds prn  . Morbid obesity ([place])   . Osgood-Schlatter's disease of left knee    left knee  . PCOS (polycystic ovarian syndrome)   . Poison ivy dermatitis 07/27/2019  . Seasonal allergies   . Sinusitis, acute maxillary 07/06/2020  . Subacromial bursitis of left shoulder joint 11/28/2019  . Tooth pain 05/26/2013   "bottom right" (05/26/2013)  . Uterine fibroid    Review of Systems: Review of Systems  Constitutional: Negative for chills and fever.  Musculoskeletal: Positive for joint pain and myalgias.  Neurological: Negative for dizziness, focal weakness, weakness and headaches.   Physical Exam:  Vitals:   02/14/21 1539  BP: 120/67  Pulse: (!) 109  Temp: 98.9 F (37.2 C)  TempSrc: Oral  SpO2: 98%  Weight: 282 lb 11.2 oz (128.2 kg)  Height: 5\' 2"  (1.575 m)     Physical Exam Vitals and nursing note reviewed.  Constitutional:      General: She is not in acute distress.    Appearance: She is obese.  Skin:    General: Skin is warm and dry.     Comments: Left lower quadrant abdominal wall cellulitis resolved, with well-healing scar present.  Pilonidal cyst located on the left superior aspect of the gluteal cleft healed.  Neurological:     General: No focal deficit present.     Mental Status: She is alert and oriented to person, place, and time.  Psychiatric:         Mood and Affect: Mood normal.        Behavior: Behavior normal.     Assessment & Plan:   See Encounters Tab for problem based charting.  Patient discussed with Dr. Daryll Drown

## 2021-02-22 NOTE — Progress Notes (Signed)
Internal Medicine Clinic Attending  Case discussed with Dr. Basaraba  At the time of the visit.  We reviewed the resident's history and exam and pertinent patient test results.  I agree with the assessment, diagnosis, and plan of care documented in the resident's note.  

## 2021-03-13 ENCOUNTER — Other Ambulatory Visit: Payer: Self-pay | Admitting: Internal Medicine

## 2021-03-13 ENCOUNTER — Other Ambulatory Visit: Payer: Self-pay | Admitting: Student

## 2021-03-13 DIAGNOSIS — Z794 Long term (current) use of insulin: Secondary | ICD-10-CM

## 2021-03-13 DIAGNOSIS — E1169 Type 2 diabetes mellitus with other specified complication: Secondary | ICD-10-CM

## 2021-03-13 MED ORDER — JANUMET 50-1000 MG PO TABS
1.0000 | ORAL_TABLET | Freq: Two times a day (BID) | ORAL | 3 refills | Status: DC
Start: 2021-03-13 — End: 2021-03-13

## 2021-03-13 MED FILL — PANTOPRAZOLE SOD DR 40 MG T: 40 | 30 days supply | Qty: 30 | Fill #2

## 2021-03-13 MED FILL — ATORVASTATIN CALCIUM 20 MG: 20 | 30 days supply | Qty: 30 | Fill #2

## 2021-03-13 MED FILL — KETOCONAZOLE 2% CREAM: 2 | 15 days supply | Qty: 15 | Fill #0

## 2021-03-13 MED FILL — LISINOPRIL 10 MG TABS: 10 | 30 days supply | Qty: 30 | Fill #2

## 2021-03-13 MED FILL — BD PEN NDL NANO 32GX5/32: 32G X 4 MM | 25 days supply | Qty: 100 | Fill #2

## 2021-03-13 MED FILL — NOVOLOG FLEXPEN SYRINGE: 100 | 28 days supply | Qty: 21 | Fill #2

## 2021-03-13 MED FILL — JANUMET 50-1,000 MG TABLET: 50-1000 | 30 days supply | Qty: 60 | Fill #0

## 2021-03-13 MED FILL — MELOXICAM 15 MG TABLET: 15 | 30 days supply | Qty: 30 | Fill #1

## 2021-03-13 MED FILL — LEVEMIR FLEXTOUCH 100 UNITS: 100 | 30 days supply | Qty: 21 | Fill #2

## 2021-03-23 ENCOUNTER — Other Ambulatory Visit (HOSPITAL_COMMUNITY): Payer: Self-pay

## 2021-03-27 ENCOUNTER — Other Ambulatory Visit: Payer: Self-pay

## 2021-03-27 ENCOUNTER — Other Ambulatory Visit (HOSPITAL_COMMUNITY): Payer: Self-pay

## 2021-03-27 ENCOUNTER — Encounter: Payer: Self-pay | Admitting: Internal Medicine

## 2021-03-27 ENCOUNTER — Ambulatory Visit (INDEPENDENT_AMBULATORY_CARE_PROVIDER_SITE_OTHER): Payer: Self-pay | Admitting: Internal Medicine

## 2021-03-27 DIAGNOSIS — Z794 Long term (current) use of insulin: Secondary | ICD-10-CM

## 2021-03-27 DIAGNOSIS — L02214 Cutaneous abscess of groin: Secondary | ICD-10-CM | POA: Insufficient documentation

## 2021-03-27 DIAGNOSIS — E1169 Type 2 diabetes mellitus with other specified complication: Secondary | ICD-10-CM

## 2021-03-27 LAB — POCT GLYCOSYLATED HEMOGLOBIN (HGB A1C): Hemoglobin A1C: 8.1 % — AB (ref 4.0–5.6)

## 2021-03-27 LAB — GLUCOSE, CAPILLARY: Glucose-Capillary: 191 mg/dL — ABNORMAL HIGH (ref 70–99)

## 2021-03-27 MED ORDER — DOXYCYCLINE MONOHYDRATE 100 MG PO TABS
100.0000 mg | ORAL_TABLET | Freq: Two times a day (BID) | ORAL | 0 refills | Status: DC
  Filled 2021-03-27: qty 14, 7d supply, fill #0

## 2021-03-27 NOTE — Progress Notes (Signed)
   CC: Boil  HPI: [name] is a 45 y.o. with PMH listed below presenting with complaint of boil. Please see problem based assessment and plan for further details.  Past Medical History:  Diagnosis Date  . Arthritis    "beginnings in my hands" (05/26/2013)  . Diabetes mellitus without complication ([place]) 5643  . GERD (gastroesophageal reflux disease)    otc meds prn  . Golfer's elbow, right 11/06/2017  . Migraines, neuralgic    '~ 3/week" (05/26/2013) - otc meds prn  . Morbid obesity ([place])   . Osgood-Schlatter's disease of left knee    left knee  . PCOS (polycystic ovarian syndrome)   . Poison ivy dermatitis 07/27/2019  . Seasonal allergies   . Sinusitis, acute maxillary 07/06/2020  . Subacromial bursitis of left shoulder joint 11/28/2019  . Tooth pain 05/26/2013   "bottom right" (05/26/2013)  . Uterine fibroid    Review of Systems: Review of Systems  Constitutional: Negative for chills, fever and malaise/fatigue.  Eyes: Negative for blurred vision.  Respiratory: Negative for cough and shortness of breath.   Cardiovascular: Negative for chest pain, palpitations and leg swelling.  Gastrointestinal: Negative for constipation, diarrhea, nausea and vomiting.  Genitourinary: Negative for dysuria, frequency and urgency.  Skin: Positive for rash.  Neurological: Negative for dizziness and headaches.  All other systems reviewed and are negative.    Physical Exam: There were no vitals filed for this visit.  Gen: Well-developed, well nourished, NAD HEENT: NCAT head, hearing intact CV: RRR, S1, S2 normal Pulm: CTAB, No rales, no wheezes Extm: ROM intact, Peripheral pulses intact, No peripheral edema Skin: Dry, Warm, normal turgor, multiple pustules of varying sizes on extremities as well as abdomen and groin. Large ulcerated nodule on R groin with purulent drainage ~2.5cm in diameter with surrounding erythema, tenderness to palpation and warmth.  Assessment & Plan:   Type 2  diabetes mellitus (HCC) Lab Results  Component Value Date   HGBA1C 8.1 (A) 03/27/2021   Presents for f/u for management of her diabetes. Previously noted to have poor control with hgb a1c >10. Currently on levemir, novolog and janumet. Mentions no hypoglycemic events. States recurrent skin infections appear to be driving her sugars up. Deneis any polyuria, polydipsia, polyphagia.  A/P Presents for diabetes management. Showing improved glycemic control. Personal glucometer reviewed showing hyperglycemic events primarily during mealtimes. Currently taking novolog 25 units TID qc and 35 units levemir BID. - Increase novolog to 30 units TID - C/w levemir 35 units BID - F/u in 3 months  Abscess of right groin [name] presents to [facility] with large R groin purulent abscess. She has had history of recurrent folliculitis with abscess in variety of locations around her torso and extremities. She mentions this abscess started recently and has been increasing becoming larger and more tender. She mentions earlier this morning the wound began to have spontaneous drainage. She denies any systemic symptoms. She inquires to effectiveness of biologic treatment of hidradenitis suppurativa  A/p Presenting with recurrent abscesses. One large abscess noted on groin this visit with purulent drainage concerning for active infection. Treatment complicated by lack of insurance. Unable to qualify for charity care. Will need to c/w treat recurrent episodes and focus on limiting risk factors, such as improving glycemic control.  - Start doxycycline 100mg  BID for 7 days.    Patient discussed with Dr. Evette Doffing  -Gilberto Better, [facility] Pager: [phone]

## 2021-03-27 NOTE — Patient Instructions (Addendum)
Thank you for allowing [place] to provide your care today. Today we discussed your skin infection    I have ordered hgb a1c labs for you. I will call if any are abnormal.    Today we made no changes to your medications.    Please follow-up in 3 months.    Should you have any questions or concerns please call the internal medicine clinic at [phone].     Skin Abscess  A skin abscess is an infected area of your skin that contains pus and other material. An abscess can happen in any part of your body. Some abscesses break open (rupture) on their own. Most continue to get worse unless they are treated. The infection can spread deeper into the body and into your blood, which can make you feel sick. A skin abscess is caused by germs that enter the skin through a cut or scrape. It can also be caused by blocked oil and sweat glands or infected hair follicles. This condition is usually treated by:  Draining the pus.  Taking antibiotic medicines.  Placing a warm, wet washcloth over the abscess. Follow these instructions at home: Medicines  Take over-the-counter and prescription medicines only as told by your doctor.  If you were prescribed an antibiotic medicine, take it as told by your doctor. Do not stop taking the antibiotic even if you start to feel better.   Abscess care  If you have an abscess that has not drained, place a warm, clean, wet washcloth over the abscess several times a day. Do this as told by your doctor.  Follow instructions from your doctor about how to take care of your abscess. Make sure you: ? Cover the abscess with a bandage (dressing). ? Change your bandage or gauze as told by your doctor. ? Wash your hands with soap and water before you change the bandage or gauze. If you cannot use soap and water, use hand sanitizer.  Check your abscess every day for signs that the infection is getting worse. Check for: ? More redness, swelling, or pain. ? More fluid or  blood. ? Warmth. ? More pus or a bad smell.   General instructions  To avoid spreading the infection: ? Do not share personal care items, towels, or hot tubs with others. ? Avoid making skin-to-skin contact with other people.  Keep all follow-up visits as told by your doctor. This is important. Contact a doctor if:  You have more redness, swelling, or pain around your abscess.  You have more fluid or blood coming from your abscess.  Your abscess feels warm when you touch it.  You have more pus or a bad smell coming from your abscess.  You have a fever.  Your muscles ache.  You have chills.  You feel sick. Get help right away if:  You have very bad (severe) pain.  You see red streaks on your skin spreading away from the abscess. Summary  A skin abscess is an infected area of your skin that contains pus and other material.  The abscess is caused by germs that enter the skin through a cut or scrape. It can also be caused by blocked oil and sweat glands or infected hair follicles.  Follow your doctor's instructions on caring for your abscess, taking medicines, preventing infections, and keeping follow-up visits. This information is not intended to replace advice given to you by your health care provider. Make sure you discuss any questions you have with your health care  provider. Document Revised: 07/14/2019 Document Reviewed: 01/21/2018 Elsevier Patient Education  2021 [employer].

## 2021-03-27 NOTE — Assessment & Plan Note (Addendum)
Lab Results  Component Value Date   HGBA1C 8.1 (A) 03/27/2021   Presents for f/u for management of her diabetes. Previously noted to have poor control with hgb a1c >10. Currently on levemir, novolog and janumet. Mentions no hypoglycemic events. States recurrent skin infections appear to be driving her sugars up. Deneis any polyuria, polydipsia, polyphagia.  A/P Presents for diabetes management. Showing improved glycemic control. Personal glucometer reviewed showing hyperglycemic events primarily during mealtimes. Currently taking novolog 25 units TID qc and 35 units levemir BID. - Increase novolog to 30 units TID - C/w levemir 35 units BID - F/u in 3 months

## 2021-03-27 NOTE — Assessment & Plan Note (Signed)
Ms.Rodelo presents to [facility] with large R groin purulent abscess. She has had history of recurrent folliculitis with abscess in variety of locations around her torso and extremities. She mentions this abscess started recently and has been increasing becoming larger and more tender. She mentions earlier this morning the wound began to have spontaneous drainage. She denies any systemic symptoms. She inquires to effectiveness of biologic treatment of hidradenitis suppurativa  A/p Presenting with recurrent abscesses. One large abscess noted on groin this visit with purulent drainage concerning for active infection. Treatment complicated by lack of insurance. Unable to qualify for charity care. Will need to c/w treat recurrent episodes and focus on limiting risk factors, such as improving glycemic control.  - Start doxycycline 100mg  BID for 7 days.

## 2021-03-28 NOTE — Progress Notes (Signed)
Internal Medicine Clinic Attending  Case discussed with Dr. Lee  At the time of the visit.  We reviewed the resident's history and exam and pertinent patient test results.  I agree with the assessment, diagnosis, and plan of care documented in the resident's note.    

## 2021-04-10 ENCOUNTER — Encounter: Payer: No Typology Code available for payment source | Admitting: Internal Medicine

## 2021-04-15 ENCOUNTER — Other Ambulatory Visit (HOSPITAL_COMMUNITY): Payer: Self-pay

## 2021-04-15 ENCOUNTER — Other Ambulatory Visit: Payer: Self-pay | Admitting: Internal Medicine

## 2021-04-15 DIAGNOSIS — K219 Gastro-esophageal reflux disease without esophagitis: Secondary | ICD-10-CM

## 2021-04-15 MED ORDER — PANTOPRAZOLE SODIUM 40 MG PO TBEC
40.0000 mg | DELAYED_RELEASE_TABLET | Freq: Every day | ORAL | 0 refills | Status: DC
  Filled 2021-04-15: qty 90, fill #0

## 2021-04-15 MED ORDER — PANTOPRAZOLE SODIUM 40 MG PO TBEC
40.0000 mg | DELAYED_RELEASE_TABLET | Freq: Every day | ORAL | 0 refills | Status: DC
  Filled 2021-04-15: qty 30, 30d supply, fill #0
  Filled 2021-05-16: qty 30, 30d supply, fill #1
  Filled 2021-06-17: qty 30, 30d supply, fill #2

## 2021-04-15 MED FILL — Sitagliptin-Metformin HCl Tab 50-1000 MG: ORAL | 30 days supply | Qty: 60 | Fill #0 | Status: AC

## 2021-04-15 MED FILL — Insulin Detemir Soln Pen-injector 100 Unit/ML: SUBCUTANEOUS | 30 days supply | Qty: 21 | Fill #0 | Status: AC

## 2021-04-15 MED FILL — Lisinopril Tab 10 MG: ORAL | 30 days supply | Qty: 30 | Fill #0 | Status: AC

## 2021-04-15 MED FILL — Insulin Aspart Soln Pen-injector 100 Unit/ML: SUBCUTANEOUS | 28 days supply | Qty: 21 | Fill #0 | Status: AC

## 2021-04-15 MED FILL — Atorvastatin Calcium Tab 20 MG (Base Equivalent): ORAL | 30 days supply | Qty: 30 | Fill #0 | Status: AC

## 2021-04-15 NOTE — Addendum Note (Signed)
Addended by: Mosetta Anis on: 04/15/2021 11:09 AM   Modules accepted: Orders

## 2021-04-15 NOTE — Telephone Encounter (Signed)
Pls contact pt regarding medicine [phone]

## 2021-04-17 ENCOUNTER — Other Ambulatory Visit: Payer: Self-pay | Admitting: Internal Medicine

## 2021-04-17 ENCOUNTER — Other Ambulatory Visit (HOSPITAL_COMMUNITY): Payer: Self-pay

## 2021-04-18 ENCOUNTER — Other Ambulatory Visit (HOSPITAL_COMMUNITY): Payer: Self-pay

## 2021-04-18 ENCOUNTER — Other Ambulatory Visit: Payer: Self-pay

## 2021-04-18 ENCOUNTER — Ambulatory Visit: Payer: Self-pay | Admitting: Student

## 2021-04-18 ENCOUNTER — Encounter: Payer: Self-pay | Admitting: Student

## 2021-04-18 VITALS — BP 119/84 | HR 112 | Temp 99.1°F | Ht 62.0 in | Wt 286.7 lb

## 2021-04-18 DIAGNOSIS — F17299 Nicotine dependence, other tobacco product, with unspecified nicotine-induced disorders: Secondary | ICD-10-CM

## 2021-04-18 DIAGNOSIS — K05219 Aggressive periodontitis, localized, unspecified severity: Secondary | ICD-10-CM

## 2021-04-18 DIAGNOSIS — Z794 Long term (current) use of insulin: Secondary | ICD-10-CM

## 2021-04-18 DIAGNOSIS — L02214 Cutaneous abscess of groin: Secondary | ICD-10-CM

## 2021-04-18 DIAGNOSIS — E1169 Type 2 diabetes mellitus with other specified complication: Secondary | ICD-10-CM

## 2021-04-18 DIAGNOSIS — L732 Hidradenitis suppurativa: Secondary | ICD-10-CM

## 2021-04-18 MED ORDER — HYDROCODONE-ACETAMINOPHEN 7.5-325 MG PO TABS
1.0000 | ORAL_TABLET | Freq: Four times a day (QID) | ORAL | 0 refills | Status: DC | PRN
  Filled 2021-04-18: qty 20, 5d supply, fill #0

## 2021-04-18 MED ORDER — AMOXICILLIN-POT CLAVULANATE 875-125 MG PO TABS
1.0000 | ORAL_TABLET | Freq: Two times a day (BID) | ORAL | 0 refills | Status: AC
  Filled 2021-04-18: qty 14, 7d supply, fill #0

## 2021-04-18 NOTE — Patient Instructions (Signed)
Whitney Wagner,   You do appear to have an early abscess of your gums.   Please take Augmentin 1 tablet in the morning, 1 tablet in the evening until your prescription runs out. I have prescribed a short course of pain medication. You may take 1 pill up to every 6 hours.  Please try to schedule a dentist appointment or reach back out to [facility] if your symptoms worsen despite antibiotics or you develop fevers and chills that persist despite tylenol or ibuprofen.   I hope you feel better soon!  Dr. Konrad Penta    Dental Abscess  A dental abscess is an area of pus in or around a tooth. It comes from an infection. It can cause pain and other symptoms. Treatment will help with symptoms and prevent the infection from spreading. Follow these instructions at home: Medicines  Take over-the-counter and prescription medicines only as told by your dentist.  If you were prescribed an antibiotic medicine, take it as told by your dentist. Do not stop taking it even if you start to feel better.  If you were prescribed a gel that has numbing medicine in it, use it exactly as told.  Do not drive or use heavy machinery (like a Conservation officer, nature) while taking prescription pain medicine. General instructions  Rinse out your mouth often with salt water. ? To make salt water, dissolve -1 tsp of salt in 1 cup of warm water.  Eat a soft diet while your mouth is healing.  Drink enough fluid to keep your urine pale yellow.  Do not apply heat to the outside of your mouth.  Do not use any products that contain nicotine or tobacco. These include cigarettes and e-cigarettes. If you need help quitting, ask your doctor.  Keep all follow-up visits as told by your dentist. This is important.   Prevent an abscess  Brush your teeth every morning and every night. Use fluoride toothpaste.  Floss your teeth each day.  Get dental cleanings as often as told by your dentist.  Think about getting dental sealant put on teeth  that have deep holes (decay).  Drink water that has fluoride in it. ? Most tap water has fluoride. ? Check the label on bottled water to see if it has fluoride in it.  Drink water instead of sugary drinks.  Eat healthy meals and snacks.  Wear a mouth guard or face shield when you play sports. Contact a doctor if:  Your pain is worse, and medicine does not help. Get help right away if:  You have a fever or chills.  Your symptoms suddenly get worse.  You have a very bad headache.  You have problems breathing or swallowing.  You have trouble opening your mouth.  You have swelling in your neck or close to your eye. Summary  A dental abscess is an area of pus in or around a tooth. It is caused by an infection.  Treatment will help with symptoms and prevent the infection from spreading.  Take over-the-counter and prescription medicines only as told by your dentist.  To prevent an abscess, take good care of your teeth. Brush your teeth every morning and night. Use floss every day.  Get dental cleanings as often as told by your dentist. This information is not intended to replace advice given to you by your health care provider. Make sure you discuss any questions you have with your health care provider. Document Revised: 06/29/2020 Document Reviewed: 06/29/2020 Elsevier Patient Education  2021  [employer].

## 2021-04-19 ENCOUNTER — Other Ambulatory Visit (HOSPITAL_COMMUNITY): Payer: Self-pay

## 2021-04-20 DIAGNOSIS — K05219 Aggressive periodontitis, localized, unspecified severity: Secondary | ICD-10-CM | POA: Insufficient documentation

## 2021-04-20 NOTE — Assessment & Plan Note (Signed)
Patient endorses worsening gum pain that started 2 days ago with history of frequent very poor dentition with multiple dental infections in the past. Physical exam is consistent with an early maxillary gum abscess (see exam). She has had subjective fevers and chills at home more recently that have been controlled with OTC medications. She says she has some chloraseptic mouth rinse at home she has not tried using. Is unable to afford to go to a dentist or miss work currently.   - Start Augmentin 875-125mg  twice daily x 7 days  - Patient is to call [facility] or schedule an urgent dental visit (recommended regular follow up as well) if symptoms worsen despite ABX  - Wrote short script for Norco 7.5-325mg  q6 hours PRN for pain that does not respond adequately with OTC medications  - Recommend oral chloraseptic mouth rinses/sprays

## 2021-04-20 NOTE — Assessment & Plan Note (Signed)
Patient continues to smoke and is pre-contemplative.   - Continue cessation discussions

## 2021-04-20 NOTE — Assessment & Plan Note (Addendum)
Patient states her right groin abscess is healing well s/p doxycycline x 7 days last visit.   - No follow up indicated for now

## 2021-04-20 NOTE — Progress Notes (Signed)
   CC: Gum Abscess   HPI:  Whitney Wagner is a 45 y.o. morbidly obese lady w/ PMHx type II DM, PCOS, maxillary sinusitis, and frequent hx dental and skin infections, presenting due to concern for gum abscess causing significant discomfort. Please see problem-based assessment and plan for full details.   Past Medical History:  Diagnosis Date  . Arthritis    "beginnings in my hands" (05/26/2013)  . Diabetes mellitus without complication ([place]) 9675  . GERD (gastroesophageal reflux disease)    otc meds prn  . Golfer's elbow, right 11/06/2017  . Migraines, neuralgic    '~ 3/week" (05/26/2013) - otc meds prn  . Morbid obesity ([place])   . Osgood-Schlatter's disease of left knee    left knee  . PCOS (polycystic ovarian syndrome)   . Poison ivy dermatitis 07/27/2019  . Seasonal allergies   . Sinusitis, acute maxillary 07/06/2020  . Subacromial bursitis of left shoulder joint 11/28/2019  . Tooth pain 05/26/2013   "bottom right" (05/26/2013)  . Uterine fibroid    Review of Systems:  All others negative except as noted in assessment / plan.   Social Hx:  Patient continues to smoke cigarettes daily and has a prolonged hx of cigarette use.  She does not drink alcohol or use other illicit drugs, currently.   Physical Exam:  Vitals:   04/18/21 1351  BP: 119/84  Pulse: (!) 112  Temp: 99.1 F (37.3 C)  TempSrc: Oral  SpO2: 99%  Weight: 286 lb 11.2 oz (130 kg)  Height: 5\' 2"  (1.575 m)   General: Patient is morbidly obese. She appears to be in moderate discomfort although in no acute distress.  Eyes: Sclera non-icteric. No conjunctival injection. Full EOMI. HENT: Patient has poor dentition with multiple carries and many missing teeth. Tooth #9 in particular is mostly decayed, down to the root. There is a ~3x43mm raised, erythematous area over the top of the maxillary gum between teeth #9-10 where the gums meet the lips without active drainage or overlying lesion. No other oral lesions noted.  There is mild maxillary sinus tenderness on the left. There is mild tenderness over anterior neck although without palpable lymphadenopathy of the face or neck, without stridor. There is no nasal discharge or erythema. Respiratory: There are scattered rhonchi in bilateral upper lung fields although no increased work of breathing, wheezing, or rales.  Cardiovascular: Rate is borderline tachycardic. Rhythm is regular. No murmurs, rubs, or gallops.  Skin: No lesions. No rashes.  Neurological: Sensation is intact and equal over bilateral sides of face. No facial droop. Psych: Normal affect. Normal tone of voice.   Assessment & Plan:   See Encounters Tab for problem based charting.  Patient discussed with Dr. Evette Doffing.  Jeralyn Bennett, MD 04/20/2021, 5:54 PM Pager: [phone]

## 2021-04-20 NOTE — Assessment & Plan Note (Signed)
Patient is proud she has kept her blood sugars down to ~160 fasting in the mornings until recently. She says since her infection began about 2 days ago, her sugars have been more elevated, up to 266 when she checked during our office visit today. She says she self-titrates her insulin dosing based on daily sugars.   - Encouraged her to check sugars more frequently during her infection and continue to titrate her Novolog SSI for a goal of closer to 120 fasting in the mornings and throughout the day before meals  - For now, continue Levemir 35 units BID although this will likely need to be increased at future visit

## 2021-04-20 NOTE — Assessment & Plan Note (Signed)
Patient has a history of hidradenitis suppurativa and PCOS (per chart review) with frequent skin infections, although currently does not appear to be on metformin or daily preventive antibiotics.   - Consider initiating metformin and prolonged ABX at future visit

## 2021-04-22 NOTE — Progress Notes (Signed)
Internal Medicine Clinic Attending  Case discussed with Dr. Speakman  At the time of the visit.  We reviewed the resident's history and exam and pertinent patient test results.  I agree with the assessment, diagnosis, and plan of care documented in the resident's note.  

## 2021-04-26 ENCOUNTER — Telehealth: Payer: Self-pay

## 2021-04-26 ENCOUNTER — Other Ambulatory Visit (HOSPITAL_COMMUNITY): Payer: Self-pay

## 2021-04-26 MED ORDER — FLUCONAZOLE 50 MG PO TABS
150.0000 mg | ORAL_TABLET | Freq: Once | ORAL | 0 refills | Status: DC
  Filled 2021-04-26: qty 3, 1d supply, fill #0

## 2021-04-26 MED ORDER — FLUCONAZOLE 150 MG PO TABS: ORAL_TABLET | ORAL | 0 refills | Status: AC

## 2021-04-26 MED ORDER — FLUCONAZOLE 50 MG PO TABS: 150.0000 mg | ORAL_TABLET | Freq: Once | ORAL | 0 refills | Status: AC

## 2021-04-26 NOTE — Telephone Encounter (Signed)
Called patient, she states she knows she has a yeast infection.  Similar to yeast infection she has had in the past.  Will order 150 mg dose of Diflucan.  Prescription sent to [facility]. in [place].

## 2021-04-26 NOTE — Telephone Encounter (Signed)
Requesting refill on yeast infection med, please call pt back.

## 2021-04-26 NOTE — Telephone Encounter (Signed)
Patient called back and noted that in the past she has needed three doses of 150 mg every other day for 3 days. Additional two doses ordered

## 2021-04-26 NOTE — Telephone Encounter (Signed)
Called pt who stated she usually get a yeast infection after taking abx in which she has. Requesting rx for Diflucan. Thanks

## 2021-05-16 ENCOUNTER — Other Ambulatory Visit: Payer: Self-pay | Admitting: Internal Medicine

## 2021-05-16 ENCOUNTER — Other Ambulatory Visit (HOSPITAL_COMMUNITY): Payer: Self-pay

## 2021-05-16 DIAGNOSIS — E785 Hyperlipidemia, unspecified: Secondary | ICD-10-CM

## 2021-05-16 DIAGNOSIS — E1169 Type 2 diabetes mellitus with other specified complication: Secondary | ICD-10-CM

## 2021-05-16 MED FILL — Insulin Detemir Soln Pen-injector 100 Unit/ML: SUBCUTANEOUS | 30 days supply | Qty: 21 | Fill #1 | Status: AC

## 2021-05-16 MED FILL — Sitagliptin-Metformin HCl Tab 50-1000 MG: ORAL | 30 days supply | Qty: 60 | Fill #1 | Status: AC

## 2021-05-16 MED FILL — Lisinopril Tab 10 MG: ORAL | 30 days supply | Qty: 30 | Fill #1 | Status: AC

## 2021-05-17 ENCOUNTER — Other Ambulatory Visit: Payer: Self-pay | Admitting: Internal Medicine

## 2021-05-17 ENCOUNTER — Other Ambulatory Visit (HOSPITAL_COMMUNITY): Payer: Self-pay

## 2021-05-17 DIAGNOSIS — E785 Hyperlipidemia, unspecified: Secondary | ICD-10-CM

## 2021-05-17 DIAGNOSIS — E1169 Type 2 diabetes mellitus with other specified complication: Secondary | ICD-10-CM

## 2021-05-21 MED ORDER — NOVOLOG FLEXPEN 100 UNIT/ML ~~LOC~~ SOPN
25.0000 [IU] | PEN_INJECTOR | Freq: Three times a day (TID) | SUBCUTANEOUS | 5 refills | Status: DC
  Filled 2021-05-21: qty 15, fill #0
  Filled 2021-05-22: qty 15, 20d supply, fill #0
  Filled 2021-06-17: qty 21, 28d supply, fill #1
  Filled 2021-07-15: qty 21, 28d supply, fill #2
  Filled 2021-08-19: qty 21, 28d supply, fill #3

## 2021-05-21 MED ORDER — ATORVASTATIN CALCIUM 20 MG PO TABS
20.0000 mg | ORAL_TABLET | Freq: Every day | ORAL | 3 refills | Status: DC
  Filled 2021-05-21: qty 30, fill #0
  Filled 2021-05-22: qty 30, 30d supply, fill #0
  Filled 2021-06-17: qty 30, 30d supply, fill #1
  Filled 2021-07-10: qty 30, 30d supply, fill #2
  Filled 2021-08-19: qty 30, 30d supply, fill #3

## 2021-05-22 ENCOUNTER — Other Ambulatory Visit (HOSPITAL_COMMUNITY): Payer: Self-pay

## 2021-05-24 ENCOUNTER — Other Ambulatory Visit (HOSPITAL_COMMUNITY): Payer: Self-pay

## 2021-06-03 ENCOUNTER — Other Ambulatory Visit (HOSPITAL_BASED_OUTPATIENT_CLINIC_OR_DEPARTMENT_OTHER): Payer: Self-pay

## 2021-06-17 ENCOUNTER — Other Ambulatory Visit (HOSPITAL_COMMUNITY): Payer: Self-pay

## 2021-06-17 ENCOUNTER — Other Ambulatory Visit: Payer: Self-pay | Admitting: Internal Medicine

## 2021-06-17 DIAGNOSIS — I10 Essential (primary) hypertension: Secondary | ICD-10-CM

## 2021-06-17 MED FILL — Insulin Detemir Soln Pen-injector 100 Unit/ML: SUBCUTANEOUS | 30 days supply | Qty: 21 | Fill #2 | Status: AC

## 2021-06-17 MED FILL — Sitagliptin-Metformin HCl Tab 50-1000 MG: ORAL | 30 days supply | Qty: 60 | Fill #2 | Status: AC

## 2021-06-18 ENCOUNTER — Other Ambulatory Visit (HOSPITAL_COMMUNITY): Payer: Self-pay

## 2021-06-18 ENCOUNTER — Other Ambulatory Visit: Payer: Self-pay | Admitting: Internal Medicine

## 2021-06-18 DIAGNOSIS — I10 Essential (primary) hypertension: Secondary | ICD-10-CM

## 2021-06-19 ENCOUNTER — Other Ambulatory Visit: Payer: Self-pay | Admitting: Student

## 2021-06-19 ENCOUNTER — Other Ambulatory Visit (HOSPITAL_COMMUNITY): Payer: Self-pay

## 2021-06-19 DIAGNOSIS — I10 Essential (primary) hypertension: Secondary | ICD-10-CM

## 2021-06-19 MED ORDER — LISINOPRIL 10 MG PO TABS
10.0000 mg | ORAL_TABLET | Freq: Every day | ORAL | 4 refills | Status: DC
  Filled 2021-06-19 – 2021-07-10 (×2): qty 30, 30d supply, fill #0
  Filled 2021-08-19: qty 30, 30d supply, fill #1
  Filled 2021-09-20: qty 30, 30d supply, fill #2
  Filled 2021-10-21: qty 30, 30d supply, fill #3
  Filled 2021-11-21: qty 30, 30d supply, fill #4

## 2021-06-19 MED ORDER — LISINOPRIL 10 MG PO TABS
10.0000 mg | ORAL_TABLET | Freq: Every day | ORAL | 4 refills | Status: DC
  Filled 2021-06-19: qty 30, fill #0

## 2021-06-25 ENCOUNTER — Encounter: Payer: Self-pay | Admitting: *Deleted

## 2021-06-27 ENCOUNTER — Other Ambulatory Visit (HOSPITAL_COMMUNITY): Payer: Self-pay

## 2021-07-10 ENCOUNTER — Other Ambulatory Visit: Payer: Self-pay | Admitting: Student

## 2021-07-10 ENCOUNTER — Other Ambulatory Visit (HOSPITAL_COMMUNITY): Payer: Self-pay

## 2021-07-10 DIAGNOSIS — Z794 Long term (current) use of insulin: Secondary | ICD-10-CM

## 2021-07-10 MED FILL — Insulin Detemir Soln Pen-injector 100 Unit/ML: SUBCUTANEOUS | 30 days supply | Qty: 21 | Fill #3 | Status: AC

## 2021-07-11 ENCOUNTER — Other Ambulatory Visit: Payer: Self-pay | Admitting: Student

## 2021-07-11 ENCOUNTER — Other Ambulatory Visit (HOSPITAL_COMMUNITY): Payer: Self-pay

## 2021-07-11 ENCOUNTER — Encounter: Payer: No Typology Code available for payment source | Admitting: Internal Medicine

## 2021-07-11 DIAGNOSIS — E1169 Type 2 diabetes mellitus with other specified complication: Secondary | ICD-10-CM

## 2021-07-11 MED ORDER — JANUMET 50-1000 MG PO TABS
1.0000 | ORAL_TABLET | Freq: Two times a day (BID) | ORAL | 3 refills | Status: DC
  Filled 2021-07-11: qty 60, 30d supply, fill #0
  Filled 2021-08-19: qty 60, 30d supply, fill #1
  Filled 2021-09-26: qty 60, 30d supply, fill #2
  Filled 2021-10-21: qty 60, 30d supply, fill #3

## 2021-07-11 MED ORDER — JANUMET 50-1000 MG PO TABS
1.0000 | ORAL_TABLET | Freq: Two times a day (BID) | ORAL | 3 refills | Status: DC
Start: 2021-07-11 — End: 2021-07-11
  Filled 2021-07-11: qty 60, 30d supply, fill #0

## 2021-07-15 ENCOUNTER — Other Ambulatory Visit: Payer: Self-pay | Admitting: Internal Medicine

## 2021-07-15 ENCOUNTER — Other Ambulatory Visit (HOSPITAL_COMMUNITY): Payer: Self-pay

## 2021-07-15 DIAGNOSIS — E1169 Type 2 diabetes mellitus with other specified complication: Secondary | ICD-10-CM

## 2021-07-15 DIAGNOSIS — G629 Polyneuropathy, unspecified: Secondary | ICD-10-CM

## 2021-07-17 ENCOUNTER — Other Ambulatory Visit (HOSPITAL_COMMUNITY): Payer: Self-pay

## 2021-07-17 ENCOUNTER — Other Ambulatory Visit: Payer: Self-pay | Admitting: Internal Medicine

## 2021-07-17 DIAGNOSIS — K219 Gastro-esophageal reflux disease without esophagitis: Secondary | ICD-10-CM

## 2021-07-18 ENCOUNTER — Other Ambulatory Visit (HOSPITAL_COMMUNITY): Payer: Self-pay

## 2021-07-18 ENCOUNTER — Other Ambulatory Visit: Payer: Self-pay | Admitting: Internal Medicine

## 2021-07-18 DIAGNOSIS — K219 Gastro-esophageal reflux disease without esophagitis: Secondary | ICD-10-CM

## 2021-07-19 ENCOUNTER — Other Ambulatory Visit (HOSPITAL_COMMUNITY): Payer: Self-pay

## 2021-07-19 ENCOUNTER — Telehealth: Payer: Self-pay

## 2021-07-19 NOTE — Telephone Encounter (Signed)
Requesting an appt for today, states she has cellulitis under the belly button. Please call pt back.

## 2021-07-19 NOTE — Telephone Encounter (Signed)
I agree

## 2021-07-19 NOTE — Telephone Encounter (Signed)
RTC to patient, she states she has cellulitis to her abdomen which she describes as "hard, deep, and warm. Denies fever at present time, but states she had a temp last night of 100.3.  RN advised patient she will need to present to ED for evaluation and possible IV ABX as [facility] is about to close.  Attending MD, DR. Narendra notified and agreed.  Patient states she will head to ED. SChaplin, RN,BSN

## 2021-07-22 ENCOUNTER — Other Ambulatory Visit (HOSPITAL_COMMUNITY): Payer: Self-pay

## 2021-07-22 MED ORDER — PANTOPRAZOLE SODIUM 40 MG PO TBEC
40.0000 mg | DELAYED_RELEASE_TABLET | Freq: Every day | ORAL | 0 refills | Status: DC
  Filled 2021-07-22: qty 30, 30d supply, fill #0
  Filled 2021-08-19: qty 30, 30d supply, fill #1
  Filled 2021-09-20: qty 30, 30d supply, fill #2

## 2021-07-23 ENCOUNTER — Encounter: Payer: Self-pay | Admitting: Student

## 2021-07-23 ENCOUNTER — Other Ambulatory Visit: Payer: Self-pay

## 2021-07-23 ENCOUNTER — Ambulatory Visit: Payer: Self-pay | Admitting: Student

## 2021-07-23 VITALS — BP 102/63 | HR 100 | Temp 98.6°F | Ht 62.0 in | Wt 286.9 lb

## 2021-07-23 DIAGNOSIS — R32 Unspecified urinary incontinence: Secondary | ICD-10-CM | POA: Insufficient documentation

## 2021-07-23 DIAGNOSIS — N3941 Urge incontinence: Secondary | ICD-10-CM

## 2021-07-23 DIAGNOSIS — N3281 Overactive bladder: Secondary | ICD-10-CM | POA: Insufficient documentation

## 2021-07-23 DIAGNOSIS — E1100 Type 2 diabetes mellitus with hyperosmolarity without nonketotic hyperglycemic-hyperosmolar coma (NKHHC): Secondary | ICD-10-CM

## 2021-07-23 DIAGNOSIS — L02214 Cutaneous abscess of groin: Secondary | ICD-10-CM

## 2021-07-23 DIAGNOSIS — K05219 Aggressive periodontitis, localized, unspecified severity: Secondary | ICD-10-CM

## 2021-07-23 LAB — POCT GLYCOSYLATED HEMOGLOBIN (HGB A1C): Hemoglobin A1C: 7.5 % — AB (ref 4.0–5.6)

## 2021-07-23 LAB — GLUCOSE, CAPILLARY: Glucose-Capillary: 222 mg/dL — ABNORMAL HIGH (ref 70–99)

## 2021-07-23 MED ORDER — HYDROCODONE-ACETAMINOPHEN 7.5-325 MG PO TABS: 1.0000 | ORAL_TABLET | Freq: Four times a day (QID) | ORAL | 0 refills | Status: AC | PRN

## 2021-07-23 MED ORDER — INSULIN DETEMIR 100 UNIT/ML FLEXPEN: 37.0000 [IU] | PEN_INJECTOR | Freq: Two times a day (BID) | SUBCUTANEOUS | 11 refills | Status: DC

## 2021-07-23 NOTE — Progress Notes (Signed)
   CC: Follow-up ED visit, diabetes management  HPI:  Whitney Wagner is a 45 y.o. with past medical history of hypertension, type 2 diabetes, HSV, who presented to clinic to follow-up on her ED visit.  Please see problem based charting for details  Past Medical History:  Diagnosis Date   Arthritis    "beginnings in my hands" (05/26/2013)   Diabetes mellitus without complication ([place]) 0000000   GERD (gastroesophageal reflux disease)    otc meds prn   Golfer's elbow, right 11/06/2017   Migraines, neuralgic    '~ 3/week" (05/26/2013) - otc meds prn   Morbid obesity ([place])    Osgood-Schlatter's disease of left knee    left knee   PCOS (polycystic ovarian syndrome)    Poison ivy dermatitis 07/27/2019   Seasonal allergies    Sinusitis, acute maxillary 07/06/2020   Subacromial bursitis of left shoulder joint 11/28/2019   Tooth pain 05/26/2013   "bottom right" (05/26/2013)   Uterine fibroid    Review of Systems:   Per HPI  Physical Exam:  Vitals:   07/23/21 0951  BP: 102/63  Pulse: 100  Temp: 98.6 F (37 C)  TempSrc: Oral  SpO2: 98%  Weight: 286 lb 14.4 oz (130.1 kg)  Height: '5\' 2"'$  (1.575 m)   Physical Exam Constitutional:      General: She is not in acute distress.    Appearance: She is not toxic-appearing.  HENT:     Head: Normocephalic.  Eyes:     Conjunctiva/sclera: Conjunctivae normal.     Pupils: Pupils are equal, round, and reactive to light.  Cardiovascular:     Rate and Rhythm: Normal rate and regular rhythm.  Pulmonary:     Effort: Pulmonary effort is normal. No respiratory distress.     Breath sounds: No wheezing.  Musculoskeletal:     Cervical back: Normal range of motion.     Comments: No wounds or ulcer noted of bilateral feet  Skin:    General: Skin is warm.  Neurological:     Mental Status: She is alert.  Psychiatric:        Mood and Affect: Mood normal.        Assessment & Plan:   See Encounters Tab for problem based charting.  Patient  discussed with Dr. Heber Pamplico

## 2021-07-23 NOTE — Assessment & Plan Note (Addendum)
Patient reports 1 year history of urinary incontinence.  Said that she has urinary frequency, specially at night.  Said that she has to use the bathroom every hour.  Also reports urge incontinence in which she could not make to the bathroom in time.  She reports some dysuria but not currently.  Denies history of STIs.  Reports monogamous with her partner.  Assessment and plan This appears like the picture of overactive bladder.  Will obtain UA to rule out infectious cause.  Will reevaluate next visit.  Addendum UA does not suggest infection.  Will address her urinary incontinence issue at next visit.

## 2021-07-23 NOTE — Assessment & Plan Note (Addendum)
Patient reports compliant with her medications and insulin.  Report lifestyle modification.  She denies any hypoglycemic events.  See that she has not had an eye exam this year.  Denies any wounds or ulcer on her foot.  Assessment and plan A1c today 7.5, improved from 8.1 3 months ago.  Her glucometer records show consistent CBG throughout the day, slightly above target goal.  We will increase Levemir to 37 units twice daily.  Continue NovoLog and Januvia.  Patient will benefit from GLP-1 after she has insurance coverage.  -Levemir 37 units twice daily -NovoLog 25 units 3 times daily -Janumet twice daily -Advised patient to have an eye exam this year

## 2021-07-23 NOTE — Patient Instructions (Signed)
Whitney Wagner,  It was nice seeing you in the clinic today.  Here to some ER we talked about:  1.  Abscess: It looks very well drained.  Please finish the course of clindamycin.  I send in 5 days of hydrocodone for you.  2.  Type 2 diabetes: Your blood sugar looks much better.  However is still slightly above target goal.  I will increase your Levemir to 37 units twice daily.  Please continue to new med and NovoLog.  Please schedule an eye exam this year.  3.  Bladder issue: We will check your urine today for infection.  I will call you with the result.  Please return to clinic in 3 months  Take care,  Dr. Alfonse Spruce

## 2021-07-23 NOTE — Assessment & Plan Note (Signed)
Patient was seen in the ED on 7/30 for multiple abscess in the groin with a fever 102.4.  The largest abscess was drained and she was started on clindamycin for 7 days.  Patient just started this medication 2 days ago.  Said that she is feeling better.  Still had fever of 101 last night.  She was given oxycodone in the ED but said that she could not take due due to side effects.  She asked if she can have hydrocodone instead.  Assessment and plan The abscess appears well drained, no purulent discharge noted.  No other drainable abscess observed.  Patient is afebrile today.  Will finish the course of clindamycin.  She does have history of HS. Patient does not have insurance and cannot afford a dermatology referral.  She is in the process of obtaining insurance and will let us know when it is effective.  -Continue clindamycin x 7d

## 2021-07-24 LAB — URINALYSIS, ROUTINE W REFLEX MICROSCOPIC
Bilirubin, UA: NEGATIVE
Glucose, UA: NEGATIVE
Ketones, UA: NEGATIVE
Leukocytes,UA: NEGATIVE
Nitrite, UA: NEGATIVE
RBC, UA: NEGATIVE
Specific Gravity, UA: 1.025 (ref 1.005–1.030)
Urobilinogen, Ur: 0.2 mg/dL (ref 0.2–1.0)
pH, UA: 5 (ref 5.0–7.5)

## 2021-07-24 LAB — MICROSCOPIC EXAMINATION
Casts: NONE SEEN /lpf
Epithelial Cells (non renal): >10 /hpf — AB (ref 0–10)

## 2021-07-24 NOTE — Progress Notes (Signed)
Internal Medicine Clinic Attending  Case discussed with Dr. Nguyen  At the time of the visit.  We reviewed the resident's history and exam and pertinent patient test results.  I agree with the assessment, diagnosis, and plan of care documented in the resident's note. 

## 2021-08-07 ENCOUNTER — Other Ambulatory Visit: Payer: Self-pay

## 2021-08-07 ENCOUNTER — Encounter: Payer: Self-pay | Admitting: Student

## 2021-08-07 ENCOUNTER — Ambulatory Visit (INDEPENDENT_AMBULATORY_CARE_PROVIDER_SITE_OTHER): Payer: Self-pay | Admitting: Student

## 2021-08-07 VITALS — BP 136/84 | HR 105 | Temp 98.1°F | Ht 62.0 in | Wt 293.6 lb

## 2021-08-07 DIAGNOSIS — N3281 Overactive bladder: Secondary | ICD-10-CM | POA: Insufficient documentation

## 2021-08-07 MED ORDER — TROSPIUM CHLORIDE 20 MG PO TABS: 20.0000 mg | ORAL_TABLET | Freq: Two times a day (BID) | ORAL | 2 refills | Status: DC

## 2021-08-07 NOTE — Patient Instructions (Addendum)
Whitney Wagner,  It was a pleasure seeing you in the clinic today.  Today we talked about overactive bladder.  Please continue Kegel exercise.  Weight loss and cutting back on smoking also help.  I have sent a prescription of trospium help with overactive bladder.  Please take 1 tablet twice daily.  We will reevaluate at next visit.  Please return in 3 months for A1c recheck  Take care,  Dr. Alfonse Spruce

## 2021-08-07 NOTE — Assessment & Plan Note (Signed)
Patient is here for follow-up on her urinary issue.  Reports urinary frequency and urgency that has been going on for more than 1 year.  Reports 4-5 trips to the bathroom the daytime and 3 trips at night.  States that she usually cannot make it to the bathroom on time when she has the urge.  Reports a few episodes of leakage with coughing but it does not happen often.  She denies vaginal discharge, bleeding or pruritus.  Denies saddle anesthesia or lower extremity weakness.    Patient denies history of STI.  Reports monogamous relationship.  States that her partner and her have diminished libido and did not have intercourse for a long time.  Patient has been practicing Kegel exercise for years but did not help.  Assessment and plan This is likely overactive bladder.  Physical exam showed normal strength and sensation of bilateral lower extremity, rules out CNS causes.  Last visit UA was negative for any infection cause.  Her uncontrolled diabetes can also play a role in urinary frequency.   We talked about lifestyle modification including continue Kegel exercise, weight loss and cutting back on e-cigarette smoking.  Also gave instruction for bladder training. Patient would like to start medication to help with her symptoms.   -Start anticholinergic medicationTropsium 20 mg BID  -Stop modification include Kegel exercises, bladder training, weight loss and smoking cessation.

## 2021-08-07 NOTE — Progress Notes (Signed)
   CC: Urinary urgency  HPI:  Whitney Wagner is a 45 y.o. with past medical history of type 2 diabetes, hypertension, who presented to the clinic for evaluation of her urinary frequency and urgency.  Please see problem based charting for detail  Past Medical History:  Diagnosis Date   Arthritis    "beginnings in my hands" (05/26/2013)   Diabetes mellitus without complication ([place]) 0000000   GERD (gastroesophageal reflux disease)    otc meds prn   Golfer's elbow, right 11/06/2017   Migraines, neuralgic    '~ 3/week" (05/26/2013) - otc meds prn   Morbid obesity ([place])    Osgood-Schlatter's disease of left knee    left knee   PCOS (polycystic ovarian syndrome)    Poison ivy dermatitis 07/27/2019   Seasonal allergies    Sinusitis, acute maxillary 07/06/2020   Subacromial bursitis of left shoulder joint 11/28/2019   Tooth pain 05/26/2013   "bottom right" (05/26/2013)   Uterine fibroid    Review of Systems: Per HPI  Physical Exam:  Vitals:   08/07/21 0902  BP: 136/84  Pulse: (!) 105  Temp: 98.1 F (36.7 C)  TempSrc: Oral  SpO2: 98%  Weight: 293 lb 9.6 oz (133.2 kg)  Height: '5\' 2"'$  (1.575 m)   Physical Exam Constitutional:      General: She is not in acute distress.    Appearance: She is not toxic-appearing.  HENT:     Head: Normocephalic.  Cardiovascular:     Rate and Rhythm: Normal rate and regular rhythm.  Pulmonary:     Effort: Pulmonary effort is normal. No respiratory distress.     Breath sounds: Normal breath sounds. No wheezing.  Musculoskeletal:        General: Normal range of motion.  Skin:    General: Skin is warm.     Coloration: Skin is not jaundiced.  Neurological:     Mental Status: She is alert.     Comments: Normal strength and sensation of bilateral lower extremities  Psychiatric:        Mood and Affect: Mood normal.        Behavior: Behavior normal.     Assessment & Plan:   See Encounters Tab for problem based charting.  Patient discussed  with Dr. Jimmye Norman

## 2021-08-19 ENCOUNTER — Other Ambulatory Visit: Payer: Self-pay | Admitting: Internal Medicine

## 2021-08-19 ENCOUNTER — Other Ambulatory Visit (HOSPITAL_COMMUNITY): Payer: Self-pay

## 2021-08-19 NOTE — Telephone Encounter (Signed)
insulin detemir (LEVEMIR) 100 UNIT/ML FlexPen, refill request @ [facility].

## 2021-08-20 ENCOUNTER — Other Ambulatory Visit (HOSPITAL_COMMUNITY): Payer: Self-pay

## 2021-08-20 ENCOUNTER — Other Ambulatory Visit: Payer: Self-pay | Admitting: Internal Medicine

## 2021-08-20 NOTE — Progress Notes (Signed)
Internal Medicine Clinic Attending  Case discussed with Dr. Nguyen  At the time of the visit.  We reviewed the resident's history and exam and pertinent patient test results.  I agree with the assessment, diagnosis, and plan of care documented in the resident's note. 

## 2021-08-22 ENCOUNTER — Other Ambulatory Visit: Payer: Self-pay | Admitting: Internal Medicine

## 2021-08-22 ENCOUNTER — Other Ambulatory Visit (HOSPITAL_COMMUNITY): Payer: Self-pay

## 2021-08-22 ENCOUNTER — Telehealth: Payer: Self-pay

## 2021-08-22 DIAGNOSIS — E1169 Type 2 diabetes mellitus with other specified complication: Secondary | ICD-10-CM

## 2021-08-22 DIAGNOSIS — E1100 Type 2 diabetes mellitus with hyperosmolarity without nonketotic hyperglycemic-hyperosmolar coma (NKHHC): Secondary | ICD-10-CM

## 2021-08-22 MED ORDER — INSULIN DETEMIR 100 UNIT/ML FLEXPEN: 37.0000 [IU] | PEN_INJECTOR | Freq: Two times a day (BID) | SUBCUTANEOUS | 11 refills | Status: DC

## 2021-08-22 MED ORDER — INSULIN DETEMIR 100 UNIT/ML FLEXPEN
37.0000 [IU] | PEN_INJECTOR | Freq: Two times a day (BID) | SUBCUTANEOUS | 11 refills | Status: DC
  Filled 2021-08-22: qty 21, 28d supply, fill #0
  Filled 2021-09-20: qty 21, 28d supply, fill #1
  Filled 2021-10-21: qty 21, 28d supply, fill #2
  Filled 2021-11-21: qty 21, 28d supply, fill #3

## 2021-08-22 MED ORDER — NOVOLOG FLEXPEN 100 UNIT/ML ~~LOC~~ SOPN
25.0000 [IU] | PEN_INJECTOR | Freq: Three times a day (TID) | SUBCUTANEOUS | 11 refills | Status: AC
  Filled 2021-08-22: qty 21, 28d supply, fill #0
  Filled 2021-09-20: qty 21, 28d supply, fill #1
  Filled 2021-10-21: qty 21, 28d supply, fill #2
  Filled 2021-11-21: qty 21, 28d supply, fill #3
  Filled 2021-12-20: qty 21, 28d supply, fill #4
  Filled 2022-01-15: qty 21, 28d supply, fill #5

## 2021-08-22 NOTE — Telephone Encounter (Signed)
Call to patient about her blood sugars:she says she feels like crap but no nausea or vomitting and is at work Blood sugars are "crazy" "compared to the 120-130 her body is used to" 180-300-400, water control medicine made her so dry she stopped the morning dose, that is working for her. She mentions that she drank 7 bottles of water and only peed 2x yesterday but is back to her normal today." Fine today. " Levemir & Novolog quantity is for 20 days and asks [place] to make it closer to 30 days or more.

## 2021-08-22 NOTE — Addendum Note (Signed)
Addended byGaylan Gerold on: 08/22/2021 03:02 PM   Modules accepted: Orders

## 2021-08-22 NOTE — Addendum Note (Signed)
Addended by: Resa Miner on: 08/22/2021 03:57 PM   Modules accepted: Orders

## 2021-08-22 NOTE — Telephone Encounter (Signed)
Spoke with Butch Penny who will contact patient.   Refill request has been sent in separate encounter to PCP as No Print option selected on Rx sent earlier.

## 2021-08-22 NOTE — Telephone Encounter (Signed)
Pt is calling back she stated she is completley out of her medication ( insulin ) and she is not feeling well he last reading 402  .Marland Kitchen pt has been out since Monday 08/19/21/

## 2021-08-22 NOTE — Telephone Encounter (Signed)
Patient asked for refills with appropriate amounts for both her Novolog and her levemir insulins.

## 2021-08-22 NOTE — Addendum Note (Signed)
Addended by: Velora Heckler on: 08/22/2021 03:17 PM   Modules accepted: Orders

## 2021-08-23 ENCOUNTER — Other Ambulatory Visit (HOSPITAL_COMMUNITY): Payer: Self-pay

## 2021-09-20 ENCOUNTER — Other Ambulatory Visit: Payer: Self-pay | Admitting: Internal Medicine

## 2021-09-20 ENCOUNTER — Other Ambulatory Visit (HOSPITAL_COMMUNITY): Payer: Self-pay

## 2021-09-20 DIAGNOSIS — E785 Hyperlipidemia, unspecified: Secondary | ICD-10-CM

## 2021-09-23 ENCOUNTER — Other Ambulatory Visit (HOSPITAL_COMMUNITY): Payer: Self-pay

## 2021-09-23 ENCOUNTER — Emergency Department (HOSPITAL_COMMUNITY)
Admission: EM | Admit: 2021-09-23 | Discharge: 2021-09-24 | Disposition: A | Discharge status: Home / Self Care | Payer: No Typology Code available for payment source | Attending: Emergency Medicine | Admitting: Emergency Medicine

## 2021-09-23 ENCOUNTER — Ambulatory Visit: Payer: Self-pay | Admitting: Internal Medicine

## 2021-09-23 ENCOUNTER — Encounter (HOSPITAL_COMMUNITY): Payer: Self-pay | Admitting: Emergency Medicine

## 2021-09-23 ENCOUNTER — Emergency Department (HOSPITAL_COMMUNITY): Payer: No Typology Code available for payment source

## 2021-09-23 ENCOUNTER — Encounter: Payer: Self-pay | Admitting: Internal Medicine

## 2021-09-23 ENCOUNTER — Telehealth: Payer: Self-pay | Admitting: Internal Medicine

## 2021-09-23 ENCOUNTER — Other Ambulatory Visit: Payer: Self-pay

## 2021-09-23 VITALS — BP 106/71 | HR 105 | Temp 99.1°F | Resp 32 | Ht 62.0 in | Wt 286.0 lb

## 2021-09-23 DIAGNOSIS — R319 Hematuria, unspecified: Secondary | ICD-10-CM | POA: Insufficient documentation

## 2021-09-23 DIAGNOSIS — R310 Gross hematuria: Secondary | ICD-10-CM

## 2021-09-23 DIAGNOSIS — I9589 Other hypotension: Secondary | ICD-10-CM

## 2021-09-23 DIAGNOSIS — R197 Diarrhea, unspecified: Secondary | ICD-10-CM | POA: Insufficient documentation

## 2021-09-23 DIAGNOSIS — Z5321 Procedure and treatment not carried out due to patient leaving prior to being seen by health care provider: Secondary | ICD-10-CM | POA: Insufficient documentation

## 2021-09-23 DIAGNOSIS — E861 Hypovolemia: Secondary | ICD-10-CM

## 2021-09-23 DIAGNOSIS — R300 Dysuria: Secondary | ICD-10-CM

## 2021-09-23 DIAGNOSIS — R109 Unspecified abdominal pain: Secondary | ICD-10-CM | POA: Insufficient documentation

## 2021-09-23 LAB — CBC WITH DIFFERENTIAL/PLATELET
Abs Immature Granulocytes: 0.06 10*3/uL (ref 0.00–0.07)
Basophils Absolute: 0.1 10*3/uL (ref 0.0–0.1)
Basophils Relative: 1 %
Eosinophils Absolute: 0.3 10*3/uL (ref 0.0–0.5)
Eosinophils Relative: 2 %
HCT: 40.6 % (ref 36.0–46.0)
Hemoglobin: 13 g/dL (ref 12.0–15.0)
Immature Granulocytes: 1 %
Lymphocytes Relative: 24 %
Lymphs Abs: 2.8 10*3/uL (ref 0.7–4.0)
MCH: 25.6 pg — ABNORMAL LOW (ref 26.0–34.0)
MCHC: 32 g/dL (ref 30.0–36.0)
MCV: 79.9 fL — ABNORMAL LOW (ref 80.0–100.0)
Monocytes Absolute: 0.6 10*3/uL (ref 0.1–1.0)
Monocytes Relative: 5 %
Neutro Abs: 7.9 10*3/uL — ABNORMAL HIGH (ref 1.7–7.7)
Neutrophils Relative %: 67 %
Platelets: 344 10*3/uL (ref 150–400)
RBC: 5.08 MIL/uL (ref 3.87–5.11)
RDW: 14 % (ref 11.5–15.5)
WBC: 11.7 10*3/uL — ABNORMAL HIGH (ref 4.0–10.5)
nRBC: 0 % (ref 0.0–0.2)

## 2021-09-23 LAB — POCT URINALYSIS DIPSTICK
Bilirubin, UA: NEGATIVE
Glucose, UA: NEGATIVE
Ketones, UA: NEGATIVE
Nitrite, UA: NEGATIVE
Protein, UA: NEGATIVE
Spec Grav, UA: 1.015 (ref 1.010–1.025)
Urobilinogen, UA: 0.2 E.U./dL
pH, UA: 6.5 (ref 5.0–8.0)

## 2021-09-23 LAB — COMPREHENSIVE METABOLIC PANEL
ALT: 62 U/L — ABNORMAL HIGH (ref 0–44)
AST: 49 U/L — ABNORMAL HIGH (ref 15–41)
Albumin: 3.5 g/dL (ref 3.5–5.0)
Alkaline Phosphatase: 43 U/L (ref 38–126)
Anion gap: 12 (ref 5–15)
BUN: 5 mg/dL — ABNORMAL LOW (ref 6–20)
CO2: 21 mmol/L — ABNORMAL LOW (ref 22–32)
Calcium: 8.4 mg/dL — ABNORMAL LOW (ref 8.9–10.3)
Chloride: 104 mmol/L (ref 98–111)
Creatinine, Ser: 0.58 mg/dL (ref 0.44–1.00)
GFR, Estimated: >60 mL/min (ref >60)
Glucose, Bld: 157 mg/dL — ABNORMAL HIGH (ref 70–99)
Potassium: 3.1 mmol/L — ABNORMAL LOW (ref 3.5–5.1)
Sodium: 137 mmol/L (ref 135–145)
Total Bilirubin: 0.7 mg/dL (ref 0.3–1.2)
Total Protein: 6.6 g/dL (ref 6.5–8.1)

## 2021-09-23 LAB — URINALYSIS, ROUTINE W REFLEX MICROSCOPIC
Bilirubin Urine: NEGATIVE
Glucose, UA: NEGATIVE mg/dL
Ketones, ur: NEGATIVE mg/dL
Nitrite: NEGATIVE
Protein, ur: 30 mg/dL — AB
Specific Gravity, Urine: 1.015 (ref 1.005–1.030)
WBC, UA: >50 WBC/hpf — ABNORMAL HIGH (ref 0–5)
pH: 5 (ref 5.0–8.0)

## 2021-09-23 LAB — LIPASE, BLOOD: Lipase: 36 U/L (ref 11–51)

## 2021-09-23 MED ORDER — IOHEXOL 300 MG/ML  SOLN
100.0000 mL | Freq: Once | INTRAMUSCULAR | Status: AC | PRN
  Administered 2021-09-23: 100 mL via INTRAVENOUS

## 2021-09-23 MED ORDER — ATORVASTATIN CALCIUM 20 MG PO TABS
20.0000 mg | ORAL_TABLET | Freq: Every day | ORAL | 3 refills | Status: DC
  Filled 2021-09-23: qty 30, 30d supply, fill #0
  Filled 2021-10-21: qty 30, 30d supply, fill #1
  Filled 2021-11-21: qty 30, 30d supply, fill #2
  Filled 2021-12-20: qty 30, 30d supply, fill #3

## 2021-09-23 NOTE — Telephone Encounter (Signed)
RTC, Pt states 11 days ago she had a "bubbling stomach, more constipated".  She took a laxative 9 days ago and has had diarrhea since that time.  She woke up this morning and noted blood in her urine.  She describes pressure/pulling when voiding.  C/O nausea, no vomitting and is able to tolerate food/liquids.  She is at work right now. Pt informed she will need to be seen in office, provide UA, etc.  F/U appt made with blue team, Dr. Marva Panda, at 1545. SChaplin, RN,BSN

## 2021-09-23 NOTE — Progress Notes (Signed)
   CC: diarrhea, urinary symptoms  HPI:  Whitney Wagner is a 45 y.o. female with PMHx as stated below presenting for evaluation of diarrhea and urinary symptoms. Please see problem based charting for complete assessment and plan.  Past Medical History:  Diagnosis Date   Arthritis    "beginnings in my hands" (05/26/2013)   Diabetes mellitus without complication ([place]) 9233   GERD (gastroesophageal reflux disease)    otc meds prn   Golfer's elbow, right 11/06/2017   Migraines, neuralgic    '~ 3/week" (05/26/2013) - otc meds prn   Morbid obesity ([place])    Osgood-Schlatter's disease of left knee    left knee   PCOS (polycystic ovarian syndrome)    Poison ivy dermatitis 07/27/2019   Seasonal allergies    Sinusitis, acute maxillary 07/06/2020   Subacromial bursitis of left shoulder joint 11/28/2019   Tooth pain 05/26/2013   "bottom right" (05/26/2013)   Uterine fibroid    Review of Systems:  Negative except as stated in HPI.  Physical Exam:  Vitals:   09/23/21 1542  BP: 106/71  Pulse: (!) 105  Resp: (!) 32  Temp: 99.1 F (37.3 C)  TempSrc: Oral  SpO2: 99%  Weight: 286 lb (129.7 kg)  Height: 5\' 2"  (1.575 m)   Physical Exam  Constitutional: middle aged obese female; in mild distress  HENT: Normocephalic and atraumatic,  dry mucous membranes Cardiovascular: Tachycardic, regular rhythm, S1 and S2 present, no murmurs, rubs, gallops.  Distal pulses intact Respiratory: No respiratory distress,  Lungs are clear to auscultation bilaterally. GI: Nondistended, soft, tenderness to palpation of the epigastric and upper abdomen; hyperactive bowel sounds  Musculoskeletal: Normal bulk and tone.  Neurological: Is alert and oriented x4, no apparent focal deficits noted. Skin: Warm and dry.  No rash, erythema, lesions noted. Psychiatric: Normal mood and affect.    Assessment & Plan:   See Encounters Tab for problem based charting.  Patient discussed with Dr. Dareen Piano

## 2021-09-23 NOTE — Assessment & Plan Note (Signed)
Patient reports one day of hematuria in which she saw significant clots of blood in her urine. She denies any dysuria but does report increased urinary urgency and feeling pressure on her bladder. She has a history of overactive bladder for which she was prescribed Sanctura which she has been taking.  She also endorses bilateral flank pain radiating the groin. Urine dipstick obtained that was positive for hematuria and small leukocytes but negative for nitrites.  Concern for possible renal stones although she does not have any history of this in the past.  Patient initially for direct admit; however, due to extensive wait time; will refer to the ED for renal imaging.

## 2021-09-23 NOTE — Telephone Encounter (Signed)
Pt has an appt today with Dr Marva Panda.

## 2021-09-23 NOTE — ED Provider Notes (Signed)
Emergency Medicine Provider Triage Evaluation Note  Whitney Wagner , a 45 y.o. female  was evaluated in triage.  Pt complains of abdominal pain, diarrhea, hematuria.  She has a history of diabetes, hysterectomy, no other abdominal surgeries.  Reports approximately 9 days of diarrhea that has been persistent.  No blood in the stool.  No recent antibiotic use.  Started noting blood in urine yesterday.  Pain is all over the abdomen but favors the left side.  She was seen in internal medicine clinic today.  Plan was to admit patient.  Sent to the emergency department for labs and imaging.  Review of Systems  Positive: Abdominal pain, diarrhea, hematuria Negative: Fever  Physical Exam  BP 116/77 (BP Location: Right Arm)   Pulse 95   Resp 16   LMP 07/06/2013   SpO2 96%  Gen:   Awake, no distress   Resp:  Normal effort  MSK:   Moves extremities without difficulty  Other:  Abdomen is generally tender, left greater than right, no rebound or guarding  Medical Decision Making  Medically screening exam initiated at 7:04 PM.  Appropriate orders placed.  Ameya L Swaminathan was informed that the remainder of the evaluation will be completed by another provider, this initial triage assessment does not replace that evaluation, and the importance of remaining in the ED until their evaluation is complete.     Carlisle Cater, PA-C 09/23/21 1905    Lucrezia Starch, MD 09/23/21 2234

## 2021-09-23 NOTE — ED Triage Notes (Signed)
Pt c/o abdominal pain x 11 days and diarrhea x 9 days. Reports she had blood in her urine this morning.

## 2021-09-23 NOTE — Telephone Encounter (Signed)
Pt calling to report -diarrhea, vaginal bleeding, dizzness, unable to hold food down.

## 2021-09-23 NOTE — Assessment & Plan Note (Signed)
Patient reports approximately 9 days of profuse watery diarrhea that is clay colored. She reports that approximately 11 days ago, her partner had similar symptoms and was diagnosed with a GI virus. She was previously constipated but reports taking dulcolax after which she has had profuse watery diarrhea. She also endorses epigastric abdominal pain radiating to bilateral upper abdomen. She has significant nausea and reports delayed digestion of food. She has not been able to tolerate much oral intake and has only been drinking gatorade.  On exam, slightly hypotensive with SBP in 90-110's. Noted to be tachycardic in low 100's. Appears hypovolemic on exam. Significant epigastric tenderness to palpation.  Concern for possible pancreatitis vs ongoing GI virus. Patient initially for direct admission; however, no beds available for extended amount of time. Patient referred to the ED for work up including CMP, Lipase, CBC, and CT Abdomen/Pelvis.

## 2021-09-24 ENCOUNTER — Observation Stay
Admission: AD | Admit: 2021-09-24 | Payer: No Typology Code available for payment source | Source: Ambulatory Visit | Admitting: Internal Medicine

## 2021-09-24 ENCOUNTER — Other Ambulatory Visit (HOSPITAL_COMMUNITY): Payer: Self-pay

## 2021-09-24 ENCOUNTER — Telehealth: Payer: Self-pay | Admitting: *Deleted

## 2021-09-24 NOTE — Progress Notes (Signed)
Internal Medicine Clinic Attending ° °Case discussed with Dr. Aslam  At the time of the visit.  We reviewed the resident’s history and exam and pertinent patient test results.  I agree with the assessment, diagnosis, and plan of care documented in the resident’s note.  °

## 2021-09-24 NOTE — Telephone Encounter (Signed)
Will initiate direct admission at this time.

## 2021-09-24 NOTE — ED Notes (Signed)
Patient left without being seen due to wait time

## 2021-09-24 NOTE — Telephone Encounter (Signed)
Call from pt who stated she was seen yesterday Dr Marva Panda and was transported to [facility] or admission. Pt stated she stayed in the ED for  hours; by 3 AM was she was told it would be another 3 -4 hrs so she left. I asked how's she feeling - stated "the same".She wants to know what the doctor wants her to do now?

## 2021-09-24 NOTE — Telephone Encounter (Signed)
Thank you :)

## 2021-09-24 NOTE — Telephone Encounter (Signed)
Bed placement called - talked to [facility] for direct admit from home. Information given for Med -Surg Obs bed for Hypotension per Dr Marva Panda. Stated she has 40 pts holding - pt may receive a call this afternoon or later. Called / informed pt of direct admission from home. Informed she will receive a call from Bed placement - time unknown. And to return to ED if her symptoms worsen. Stated she understands.

## 2021-09-25 ENCOUNTER — Other Ambulatory Visit: Payer: Self-pay

## 2021-09-25 ENCOUNTER — Telehealth: Payer: Self-pay | Admitting: Student

## 2021-09-25 ENCOUNTER — Emergency Department (HOSPITAL_BASED_OUTPATIENT_CLINIC_OR_DEPARTMENT_OTHER)
Admission: EM | Admit: 2021-09-25 | Discharge: 2021-09-25 | Disposition: A | Discharge status: Home / Self Care | Payer: No Typology Code available for payment source | Attending: Emergency Medicine | Admitting: Emergency Medicine

## 2021-09-25 ENCOUNTER — Encounter (HOSPITAL_BASED_OUTPATIENT_CLINIC_OR_DEPARTMENT_OTHER): Payer: Self-pay | Admitting: Obstetrics and Gynecology

## 2021-09-25 ENCOUNTER — Other Ambulatory Visit (HOSPITAL_COMMUNITY): Payer: Self-pay

## 2021-09-25 DIAGNOSIS — F17210 Nicotine dependence, cigarettes, uncomplicated: Secondary | ICD-10-CM | POA: Insufficient documentation

## 2021-09-25 DIAGNOSIS — Z794 Long term (current) use of insulin: Secondary | ICD-10-CM | POA: Insufficient documentation

## 2021-09-25 DIAGNOSIS — N3001 Acute cystitis with hematuria: Secondary | ICD-10-CM | POA: Insufficient documentation

## 2021-09-25 DIAGNOSIS — Z79899 Other long term (current) drug therapy: Secondary | ICD-10-CM | POA: Insufficient documentation

## 2021-09-25 DIAGNOSIS — Z91040 Latex allergy status: Secondary | ICD-10-CM | POA: Insufficient documentation

## 2021-09-25 DIAGNOSIS — I10 Essential (primary) hypertension: Secondary | ICD-10-CM | POA: Insufficient documentation

## 2021-09-25 DIAGNOSIS — R1084 Generalized abdominal pain: Secondary | ICD-10-CM

## 2021-09-25 DIAGNOSIS — K219 Gastro-esophageal reflux disease without esophagitis: Secondary | ICD-10-CM | POA: Insufficient documentation

## 2021-09-25 DIAGNOSIS — E1140 Type 2 diabetes mellitus with diabetic neuropathy, unspecified: Secondary | ICD-10-CM | POA: Insufficient documentation

## 2021-09-25 LAB — CBC
HCT: 42.7 % (ref 36.0–46.0)
Hemoglobin: 13.6 g/dL (ref 12.0–15.0)
MCH: 25.2 pg — ABNORMAL LOW (ref 26.0–34.0)
MCHC: 31.9 g/dL (ref 30.0–36.0)
MCV: 79.2 fL — ABNORMAL LOW (ref 80.0–100.0)
Platelets: 331 10*3/uL (ref 150–400)
RBC: 5.39 MIL/uL — ABNORMAL HIGH (ref 3.87–5.11)
RDW: 14.1 % (ref 11.5–15.5)
WBC: 8.1 10*3/uL (ref 4.0–10.5)
nRBC: 0 % (ref 0.0–0.2)

## 2021-09-25 LAB — URINALYSIS, ROUTINE W REFLEX MICROSCOPIC
Bilirubin Urine: NEGATIVE
Glucose, UA: NEGATIVE mg/dL
Hgb urine dipstick: NEGATIVE
Ketones, ur: NEGATIVE mg/dL
Leukocytes,Ua: NEGATIVE
Nitrite: NEGATIVE
Specific Gravity, Urine: 1.022 (ref 1.005–1.030)
pH: 6.5 (ref 5.0–8.0)

## 2021-09-25 LAB — COMPREHENSIVE METABOLIC PANEL
ALT: 77 U/L — ABNORMAL HIGH (ref 0–44)
AST: 63 U/L — ABNORMAL HIGH (ref 15–41)
Albumin: 4.2 g/dL (ref 3.5–5.0)
Alkaline Phosphatase: 43 U/L (ref 38–126)
Anion gap: 9 (ref 5–15)
BUN: 8 mg/dL (ref 6–20)
CO2: 28 mmol/L (ref 22–32)
Calcium: 9.2 mg/dL (ref 8.9–10.3)
Chloride: 104 mmol/L (ref 98–111)
Creatinine, Ser: 0.5 mg/dL (ref 0.44–1.00)
GFR, Estimated: >60 mL/min (ref >60)
Glucose, Bld: 153 mg/dL — ABNORMAL HIGH (ref 70–99)
Potassium: 3.6 mmol/L (ref 3.5–5.1)
Sodium: 141 mmol/L (ref 135–145)
Total Bilirubin: 0.5 mg/dL (ref 0.3–1.2)
Total Protein: 7.1 g/dL (ref 6.5–8.1)

## 2021-09-25 LAB — LIPASE, BLOOD: Lipase: 55 U/L — ABNORMAL HIGH (ref 11–51)

## 2021-09-25 MED ORDER — SODIUM CHLORIDE 0.9 % IV BOLUS
500.0000 mL | Freq: Once | INTRAVENOUS | Status: AC
  Administered 2021-09-25: 500 mL via INTRAVENOUS

## 2021-09-25 MED ORDER — SODIUM CHLORIDE 0.9 % IV SOLN
1.0000 g | Freq: Once | INTRAVENOUS | Status: AC
  Administered 2021-09-25: 1 g via INTRAVENOUS
  Filled 2021-09-25: qty 10

## 2021-09-25 MED ORDER — CEPHALEXIN 500 MG PO CAPS
500.0000 mg | ORAL_CAPSULE | Freq: Four times a day (QID) | ORAL | 0 refills | Status: DC
  Filled 2021-09-25: qty 28, 7d supply, fill #0

## 2021-09-25 NOTE — ED Provider Notes (Signed)
[facility] EMERGENCY DEPT Provider Note   CSN: [id number] Arrival date & time: 09/25/21  1051     History Chief Complaint  Patient presents with   Abdominal Pain    Whitney Wagner is a 45 y.o. female.  Patient was initially seen in the internal medicine clinic on 09/23/21 for abdominal pain, diarrhea x 9 days, and hematuria. She was sent to the ED on that day with plan for likely admission. Patient received lab and radiology evaluation. Due to long wait times, and the unavailability of admission beds, patient decided not to stay. She returns to our ED today after speaking with her PCP. Abdominal pain has improved, but persists, in the epigastric area, especially after eating. Patient also reporting flank pain. Diarrhea improved, now described as soft "sludge".   The history is provided by the patient and medical records. No language interpreter was used.  Abdominal Pain Pain location:  Epigastric, R flank and L flank Chronicity:  New Context: not sick contacts and not suspicious food intake   Associated symptoms: diarrhea, fatigue, hematuria and nausea       Past Medical History:  Diagnosis Date   Arthritis    "beginnings in my hands" (05/26/2013)   Diabetes mellitus without complication ([place]) 8416   GERD (gastroesophageal reflux disease)    otc meds prn   Golfer's elbow, right 11/06/2017   Migraines, neuralgic    '~ 3/week" (05/26/2013) - otc meds prn   Morbid obesity ([place])    Osgood-Schlatter's disease of left knee    left knee   PCOS (polycystic ovarian syndrome)    Poison ivy dermatitis 07/27/2019   Seasonal allergies    Sinusitis, acute maxillary 07/06/2020   Subacromial bursitis of left shoulder joint 11/28/2019   Tooth pain 05/26/2013   "bottom right" (05/26/2013)   Uterine fibroid     Patient Active Problem List   Diagnosis Date Noted   Hematuria 09/23/2021   Overactive bladder 08/07/2021   OAB (overactive bladder) 07/23/2021   Abscess of upper gum  04/20/2021   Abscess of right groin 03/27/2021   Osteoarthritis 02/14/2021   Pilonidal cyst 01/30/2021   Intertrigo 01/24/2021   Low back pain 11/22/2020   Sinus tachycardia 11/22/2020   Otitis externa 09/05/2020   Hidradenitis suppurativa 08/24/2020   Charcot ankle, left 08/24/2020   Hot flashes due to menopause 06/05/2020   Diarrhea 04/26/2020   Essential hypertension 07/27/2019   Pain in both wrists 06/04/2017   Neuropathy 11/04/2016   Morbid obesity ([place]) 08/08/2016   Type 2 diabetes mellitus ([place]) 04/28/2016   Dental caries 04/28/2016   Status post abdominal hysterectomy 03/11/2014   History of cardiac arrest 08/01/2013   Nicotine dependence 05/26/2013   Polycystic ovaries 11/04/2007    Past Surgical History:  Procedure Laterality Date   ABDOMINAL HYSTERECTOMY     EXTERNAL FIXATION ARM Right 1980's   ROBOTIC ASSISTED TOTAL HYSTERECTOMY N/A 08/01/2013   Procedure: ROBOTIC ASSISTED TOTAL HYSTERECTOMY;  Surgeon: Lavonia Drafts, MD;  Location: [facility];  Service: Gynecology;  Laterality: N/A;   S/P Multiple extractions with alveoloplasty  06/01/2013   Extraction of tooth #'s 5,12,30 with alveoloplasty-Dr. Enrique Sack   WISDOM TOOTH EXTRACTION       OB History     Gravida  0   Para  0   Term  0   Preterm  0   AB  0   Living  0      SAB  0   IAB  0  Ectopic  0   Multiple  0   Live Births              Family History  Problem Relation Age of Onset   Fibromyalgia Mother    Cancer Mother    Hypertension Mother    Diabetes Mother    Diabetes Brother    Hyperlipidemia Brother    Cancer Maternal Aunt        breast cancer   Breast cancer Maternal Aunt    Breast cancer Cousin    Breast cancer Maternal Aunt    Breast cancer Maternal Aunt    Breast cancer Cousin     Social History   Tobacco Use   Smoking status: Every Day    Packs/day: 1.00    Years: 21.00    Pack years: 21.00    Types: E-cigarettes, Cigarettes    Last attempt to quit:  07/08/2013    Years since quitting: 8.2   Smokeless tobacco: Former    Quit date: 07/01/2013   Tobacco comments:    ecigs   Substance Use Topics   Alcohol use: No   Drug use: No    Home Medications Prior to Admission medications   Medication Sig Start Date End Date Taking? Authorizing Provider  atorvastatin (LIPITOR) 20 MG tablet Take 1 tablet (20 mg total) by mouth daily. 09/23/21  Yes Gaylan Gerold, DO  insulin aspart (NOVOLOG FLEXPEN) 100 UNIT/ML FlexPen Inject 25 Units into the skin 3 (three) times daily with meals. 08/22/21  Yes Sid Falcon, MD  insulin detemir (LEVEMIR) 100 UNIT/ML FlexPen Inject 37 Units into the skin 2 (two) times daily. 08/22/21  Yes Sid Falcon, MD  lisinopril (ZESTRIL) 10 MG tablet Take 1 tablet (10 mg total) by mouth daily. 06/19/21  Yes Sanjuan Dame, MD  pantoprazole (PROTONIX) 40 MG tablet Take 1 tablet (40 mg total) by mouth daily. 07/22/21  Yes Gaylan Gerold, DO  pregabalin (LYRICA) 100 MG capsule TAKE 1 CAPSULE BY MOUTH THREE TIMES DAILY. MUST LAST 90 DAYS 07/22/21  Yes Gaylan Gerold, DO  sitaGLIPtin-metformin (JANUMET) 50-1000 MG tablet TAKE 1 TABLET BY MOUTH 2 (TWO) TIMES DAILY WITH A MEAL. 07/11/21  Yes Katsadouros, Vasilios, MD  trospium (SANCTURA) 20 MG tablet Take 1 tablet (20 mg total) by mouth 2 (two) times daily. 08/07/21 11/05/21 Yes Gaylan Gerold, DO  Continuous Blood Gluc Receiver (FREESTYLE LIBRE 14 DAY READER) DEVI Use to check blood sugar at least 6 times a day 02/06/21   Riesa Pope, MD  Continuous Blood Gluc Sensor (FREESTYLE LIBRE 2 SENSOR) MISC Use to check blood sugar at least every 8 hours and at least 6 times a day 02/04/21   Katsadouros, Vasilios, MD  doxycycline (ADOXA) 100 MG tablet TAKE 1 TABLET (100 MG TOTAL) BY MOUTH 2 (TWO) TIMES DAILY. Patient not taking: Reported on 09/25/2021 03/27/21   Mosetta Anis, MD  Insulin Pen Needle 32G X 4 MM MISC USE TO INJECT NOVOLOG AND LEVEMIR 4 TIMES DAILY 07/31/20 07/31/21  Jose Persia, MD   ketoconazole (NIZORAL) 2 % cream APPLY TOPICALLY TO AFFECTED AREA ONCE DAILY. USE FOR 2 TO 4 WEEKS UNTIL RASH IMPROVES. Patient not taking: Reported on 09/25/2021 01/23/21 01/23/22  Jose Persia, MD  linezolid (ZYVOX) 600 MG tablet TAKE 1 TABLET (600 MG TOTAL) BY MOUTH TWO TIMES DAILY FOR 7 DAYS. Patient not taking: Reported on 09/25/2021 01/11/21 01/11/22  Angelica Pou, MD  Magnesium 400 MG CAPS Take 1 tablet by mouth daily. Patient not  taking: Reported on 09/25/2021 01/12/21   Molli Hazard A, DO    Allergies    Latex, Invokana [canagliflozin], and Codeine  Review of Systems   Review of Systems  Constitutional:  Positive for fatigue.  Gastrointestinal:  Positive for abdominal pain, diarrhea and nausea.  Genitourinary:  Positive for hematuria.  All other systems reviewed and are negative.  Physical Exam Updated Vital Signs BP 94/68 (BP Location: Right Arm)   Pulse 98   Temp 98.6 F (37 C)   Resp 16   LMP 07/06/2013   SpO2 98%   Physical Exam Vitals and nursing note reviewed.  Constitutional:      Appearance: She is well-developed. She is obese.  HENT:     Head: Normocephalic.     Nose: Nose normal.     Mouth/Throat:     Mouth: Mucous membranes are moist.  Eyes:     General: No scleral icterus. Cardiovascular:     Rate and Rhythm: Normal rate and regular rhythm.  Pulmonary:     Effort: Pulmonary effort is normal.     Breath sounds: Normal breath sounds.  Abdominal:     General: Bowel sounds are normal.     Palpations: Abdomen is soft.     Tenderness: There is generalized abdominal tenderness.  Skin:    General: Skin is warm and dry.  Neurological:     Mental Status: She is alert and oriented to person, place, and time.  Psychiatric:        Mood and Affect: Mood normal.        Behavior: Behavior normal.    ED Results / Procedures / Treatments   Labs (all labs ordered are listed, but only abnormal results are displayed) Labs Reviewed  LIPASE, BLOOD -  Abnormal; Notable for the following components:      Result Value   Lipase 55 (*)    All other components within normal limits  COMPREHENSIVE METABOLIC PANEL - Abnormal; Notable for the following components:   Glucose, Bld 153 (*)    AST 63 (*)    ALT 77 (*)    All other components within normal limits  CBC - Abnormal; Notable for the following components:   RBC 5.39 (*)    MCV 79.2 (*)    MCH 25.2 (*)    All other components within normal limits  URINALYSIS, ROUTINE W REFLEX MICROSCOPIC - Abnormal; Notable for the following components:   Protein, ur TRACE (*)    Bacteria, UA MANY (*)    Non Squamous Epithelial 0-5 (*)    All other components within normal limits  URINE CULTURE    EKG None  Radiology CT ABDOMEN PELVIS W CONTRAST  Result Date: 09/23/2021 CLINICAL DATA:  Diarrhea, hematuria. EXAM: CT ABDOMEN AND PELVIS WITH CONTRAST TECHNIQUE: Multidetector CT imaging of the abdomen and pelvis was performed using the standard protocol following bolus administration of intravenous contrast. CONTRAST:  177mL OMNIPAQUE IOHEXOL 300 MG/ML  SOLN COMPARISON:  06/09/2021 FINDINGS: Lower chest: Lung bases are clear. Hepatobiliary: No focal hepatic lesion. No biliary duct dilatation. Common bile duct is normal. Pancreas: Pancreas is normal. No ductal dilatation. No pancreatic inflammation. Spleen: Normal spleen Adrenals/urinary tract: Small fat containing lesion of the LEFT adrenal gland is unchanged. Kidneys normal. Ureters and bladder normal. Stomach/Bowel: Stomach, small bowel, appendix, and cecum are normal. The colon and rectosigmoid colon are normal. Vascular/Lymphatic: Abdominal aorta is normal caliber. No periportal or retroperitoneal adenopathy. No pelvic adenopathy. Reproductive: Post hysterectomy.  Adnexa unremarkable  Other: No free fluid. Musculoskeletal: No aggressive osseous lesion. IMPRESSION: 1. Normal kidneys, ureters and bladder. 2. Normal small bowel and colon. 3. Small benign LEFT  adrenal myelolipoma. Electronically Signed   By: Suzy Bouchard M.D.   On: 09/23/2021 21:14    Procedures Procedures   Medications Ordered in ED Medications  cefTRIAXone (ROCEPHIN) 1 g in sodium chloride 0.9 % 100 mL IVPB (1 g Intravenous New Bag/Given 09/25/21 1421)  sodium chloride 0.9 % bolus 500 mL (500 mLs Intravenous New Bag/Given 09/25/21 1420)    ED Course  I have reviewed the triage vital signs and the nursing notes.  Pertinent labs & imaging results that were available during my care of the patient were reviewed by me and considered in my medical decision making (see chart for details).  Labs from today reviewed. Hepatic labs are at baseline. Leukocytosis resolved. Urinalysis with indication of UTI. Patient received IV fluids and rocephin.  No acute findings on CT of abdomen and pelvis.   MDM Rules/Calculators/A&P                           Patient is nontoxic, nonseptic appearing, in no apparent distress.  Patient's symptoms adequately managed in emergency department.  Fluid bolus given.  Labs, imaging and vitals reviewed.  Patient does not meet the SIRS or Sepsis criteria.  On repeat exam patient does not have a surgical abdomen and there are no peritoneal signs.  No indication of appendicitis, bowel obstruction, bowel perforation, cholecystitis, diverticulitis, PID or ectopic pregnancy.  Patient discharged home with symptomatic treatment and given strict instructions for follow-up with their primary care physician.  I have also discussed reasons to return immediately to the ER.  Patient expresses understanding and agrees with plan.  Pt diagnosed with a UTI. Pt is afebrile, without tachycardia, hypotension, or other signs of serious infection.  Pt to be dc home with antibiotics and instructions to follow up with PCP if symptoms persist. Discussed return precautions. Pt appears safe for discharge.    Final Clinical Impression(s) / ED Diagnoses Final diagnoses:  Generalized  abdominal pain  Acute cystitis with hematuria    Rx / DC Orders ED Discharge Orders          Ordered    cephALEXin (KEFLEX) 500 MG capsule  4 times daily        09/25/21 1706             Etta Quill, NP 09/25/21 2220    Regan Lemming, MD 09/25/21 2241

## 2021-09-25 NOTE — ED Triage Notes (Signed)
Patient reports she has diabetes, has had x11 days of diarrhea and her sugars have been out of wack. Patient states she has abdominal pain as well.

## 2021-09-25 NOTE — Telephone Encounter (Signed)
See today's telephone 09/25/21.

## 2021-09-25 NOTE — ED Notes (Signed)
I have attempted to start an IV three times. I will now have a colleague attempt IV start.

## 2021-09-25 NOTE — Telephone Encounter (Signed)
Call from pt who's still at home for direct admission; she was told no beds.  I called bed placement - Shawn stated no beds, no discharges at this time and 50 people holding in the ED.  Called pt back - informed no beds. She is willing to go to the ED at [facility] on [address].

## 2021-09-25 NOTE — Discharge Instructions (Addendum)
Please refer to the attached instructions. Take antibiotic as directed. Follow-up with your provider at the Internal Medicine Clinic.

## 2021-09-25 NOTE — Telephone Encounter (Signed)
Pt calling back to speak with a nurse about direct admitted. Please call pt back.

## 2021-09-25 NOTE — Telephone Encounter (Signed)
I called and talked to patient about her admission.  Patient went to [facility] ED at 1 PM today for evaluation.  Her blood pressure was normal at 121/82, pulse 83, CBG at 153.  CT abdomen pelvis did not show any acute pathology.  UA was negative for leukocyte and nitrite but showed many bacteria.  She was given 5 cc of normal saline and 1 dose of Rocephin.  Patient was discharged home with Keflex.    Patient reports feeling slightly better.  She was able to tolerate p.o. okay.  Given her stable hemodynamic status and normal CT scan, I believe she will not need admission at this time.  Advised patient to finish her antibiotic course and keep up with p.o. fluid intake.  Advised patient to call us or return to the ED for worsening pain or hypotension.  Patient verbalizes understanding.

## 2021-09-26 ENCOUNTER — Other Ambulatory Visit (HOSPITAL_COMMUNITY): Payer: Self-pay

## 2021-09-26 LAB — URINE CULTURE

## 2021-09-27 ENCOUNTER — Other Ambulatory Visit (HOSPITAL_COMMUNITY): Payer: Self-pay

## 2021-10-08 ENCOUNTER — Other Ambulatory Visit: Payer: Self-pay | Admitting: Student

## 2021-10-08 ENCOUNTER — Other Ambulatory Visit (HOSPITAL_COMMUNITY): Payer: Self-pay

## 2021-10-08 DIAGNOSIS — N3281 Overactive bladder: Secondary | ICD-10-CM

## 2021-10-09 ENCOUNTER — Other Ambulatory Visit (HOSPITAL_COMMUNITY): Payer: Self-pay

## 2021-10-09 ENCOUNTER — Other Ambulatory Visit: Payer: Self-pay | Admitting: Student

## 2021-10-09 DIAGNOSIS — N3281 Overactive bladder: Secondary | ICD-10-CM

## 2021-10-09 MED ORDER — TROSPIUM CHLORIDE 20 MG PO TABS: 20.0000 mg | ORAL_TABLET | Freq: Two times a day (BID) | ORAL | 2 refills | Status: AC

## 2021-10-09 MED ORDER — TROSPIUM CHLORIDE 20 MG PO TABS
20.0000 mg | ORAL_TABLET | Freq: Two times a day (BID) | ORAL | 2 refills | Status: DC
Start: 2021-10-09 — End: 2021-10-09
  Filled 2021-10-09 (×2): qty 60, 30d supply, fill #0

## 2021-10-09 NOTE — Progress Notes (Signed)
I called to confirm that patient wants her trospium sent to [facility].  She will use [facility] Rx for the prescription.  She reports persistent diarrhea, belching and flatulence.  Also reports abdominal discomfort after eating.  She endorses nausea without vomiting.  She is able to tolerate p.o. intake.  Denies any recent antibiotic use before this episode.  Advised patient to make an appointment with the [facility] for evaluation as soon as possible.  Patient verbalizes understanding.

## 2021-10-21 ENCOUNTER — Other Ambulatory Visit: Payer: Self-pay | Admitting: Internal Medicine

## 2021-10-21 ENCOUNTER — Other Ambulatory Visit (HOSPITAL_COMMUNITY): Payer: Self-pay

## 2021-10-21 ENCOUNTER — Other Ambulatory Visit: Payer: Self-pay | Admitting: Student

## 2021-10-21 DIAGNOSIS — K219 Gastro-esophageal reflux disease without esophagitis: Secondary | ICD-10-CM

## 2021-10-21 DIAGNOSIS — E1169 Type 2 diabetes mellitus with other specified complication: Secondary | ICD-10-CM

## 2021-10-21 MED ORDER — PANTOPRAZOLE SODIUM 40 MG PO TBEC
40.0000 mg | DELAYED_RELEASE_TABLET | Freq: Every day | ORAL | 0 refills | Status: DC
  Filled 2021-10-21: qty 30, 30d supply, fill #0
  Filled 2021-11-21: qty 30, 30d supply, fill #1
  Filled 2021-12-20: qty 30, 30d supply, fill #2

## 2021-10-21 MED ORDER — UNIFINE PENTIPS 32G X 4 MM MISC
3 refills | Status: AC
  Filled 2021-10-21 (×2): qty 100, 25d supply, fill #0

## 2021-11-07 ENCOUNTER — Telehealth: Payer: Self-pay | Admitting: Student

## 2021-11-07 NOTE — Telephone Encounter (Signed)
Dr. Raliegh Ip,  I called the patient back and she states she cannot get off from work to come in for an appt.  I told her to make sure she sends the picture via mychart and keep the telehealth appt for tomorrow. Thank you, Nikelle Malatesta

## 2021-11-07 NOTE — Telephone Encounter (Signed)
Thank you :)

## 2021-11-07 NOTE — Telephone Encounter (Signed)
Agree she needs to be evaluated. Would prefer an inpatient appointment if possible.

## 2021-11-07 NOTE — Telephone Encounter (Signed)
RTC, Patient states her pilonidal cyst is re-occurring.  C/O redness and tenderness.  Requesting ABX before it "gets out of hand".  Telehealth appt made for tomorrow at 1015/ blue team/Dr. Johnney Ou.  Patient will send pictures over [place], RN,BSN

## 2021-11-07 NOTE — Telephone Encounter (Signed)
Pt requesting a call back about a re-occurring "Cyst" above buttocks Red and swollen.  Pt is requesting an antibiotic be called in.   Please call the patient  back.

## 2021-11-08 ENCOUNTER — Other Ambulatory Visit (HOSPITAL_COMMUNITY): Payer: Self-pay

## 2021-11-08 ENCOUNTER — Ambulatory Visit (INDEPENDENT_AMBULATORY_CARE_PROVIDER_SITE_OTHER): Payer: Self-pay | Admitting: Student

## 2021-11-08 DIAGNOSIS — L0591 Pilonidal cyst without abscess: Secondary | ICD-10-CM

## 2021-11-08 MED ORDER — DOXYCYCLINE HYCLATE 100 MG PO CAPS
100.0000 mg | ORAL_CAPSULE | Freq: Two times a day (BID) | ORAL | 0 refills | Status: AC
  Filled 2021-11-08: qty 10, 5d supply, fill #0

## 2021-11-08 NOTE — Patient Instructions (Signed)
Thank you, [name] for allowing [place] to provide your care today. Today we discussed .  Pain in gluteal cleft This is suspicious for an early pilonidal cyst infection.  We will treat with antibiotics for 5 days.  Please take 2 pills daily.  If you notice that the symptoms not improve I would recommend that you come in person to an appointment for further evaluation.   I have ordered the following labs for you:  Lab Orders  No laboratory test(s) ordered today     Referrals ordered today:   Referral Orders  No referral(s) requested today     I have ordered the following medication/changed the following medications:   Stop the following medications: Medications Discontinued During This Encounter  Medication Reason   doxycycline (ADOXA) 100 MG tablet    cephALEXin (KEFLEX) 500 MG capsule    linezolid (ZYVOX) 600 MG tablet      Start the following medications: Meds ordered this encounter  Medications   doxycycline (VIBRAMYCIN) 100 MG capsule    Sig: Take 1 capsule (100 mg total) by mouth 2 (two) times daily for 5 days.    Dispense:  10 capsule    Refill:  0    IM Program     Follow up: Follow-up in 1 month for resolution and follow-up in January for referral placement for general surgery   Should you have any questions or concerns please call the internal medicine clinic at [phone].    Sanjuana Letters, D.O. [place]

## 2021-11-08 NOTE — Assessment & Plan Note (Addendum)
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  She cannot make an in person appointment today as she states she had to work.  She endorses pain and redness of her left upper gluteal cleft region for the past week.  She sent a picture via MyChart.  Denies any drainage.  Denies any systemic symptoms.  She states that this feels similar to pilonidal cyst she has had in the past.    Patient notes multiple recurrences of these episodes.  She states they occur multiple times a month.  She is only received antibiotics 3-4 times and is also had the area drained 3-4 times.  She has been unable to follow-up with general surgery as she does not have insurance.  She notes that in January this will occur and at that time she would like to be referred.  Per prior documentation patient has history of multiple abscesses with possible diagnosis of hidradenitis suppurativa.  Include in the differential for this would also be epidermal inclusion cyst.  Difficult to assess as this is a telehealth appointment.  Because it feels similar to abscesses in the past, we will treat with doxycycline 100 mg twice daily for 5 days.  If no resolution patient instructed to follow-up in clinic next week.  Otherwise patient can follow-up in 1 month for visit with primary care provider and in January for referral to general surgery.  Plan: -Start doxycycline 100 mg twice daily for 5 days -Follow-up next week if no resolution

## 2021-11-08 NOTE — Progress Notes (Signed)
   CC: Tenderness and erythema of gluteal cleft region  This is a telephone encounter between Whitney Wagner and [employer] on 11/08/2021 for erythema and tenderness of the gluteal cleft region. The visit was conducted with the patient located at  work  and [employer] at [facility]. The patient's identity was confirmed using their DOB and current address. The patient has consented to being evaluated through a telephone encounter and understands the associated risks (an examination cannot be done and the patient may need to come in for an appointment) / benefits (allows the patient to remain at home, decreasing exposure to coronavirus). I personally spent 15 minutes on medical discussion.   HPI:  [name] is a 45 y.o. with PMH as below.   Please see A&P for assessment of the patient's acute and chronic medical conditions.   Past Medical History:  Diagnosis Date   Arthritis    "beginnings in my hands" (05/26/2013)   Diabetes mellitus without complication ([place]) 3536   GERD (gastroesophageal reflux disease)    otc meds prn   Golfer's elbow, right 11/06/2017   Migraines, neuralgic    '~ 3/week" (05/26/2013) - otc meds prn   Morbid obesity ([place])    Osgood-Schlatter's disease of left knee    left knee   PCOS (polycystic ovarian syndrome)    Poison ivy dermatitis 07/27/2019   Seasonal allergies    Sinusitis, acute maxillary 07/06/2020   Subacromial bursitis of left shoulder joint 11/28/2019   Tooth pain 05/26/2013   "bottom right" (05/26/2013)   Uterine fibroid    Review of Systems:   Positive for pain and redness of gluteal cleft region.  Patient feels swelling/enlargement of her Negative for systemic symptoms; fevers, chills.  Denies nausea vomiting.  Denies drainage from gluteal cleft region        Assessment & Plan:   See Encounters Tab for problem based charting.  Patient discussed with Dr.  Butch Penny Internal Medicine Resident

## 2021-11-12 NOTE — Addendum Note (Signed)
Addended by: Truddie Crumble on: 11/12/2021 09:43 AM   Modules accepted: Orders

## 2021-11-12 NOTE — Progress Notes (Signed)
Internal Medicine Clinic Attending  Case discussed with Dr. Katsadouros  At the time of the visit.  We reviewed the resident's history and exam and pertinent patient test results.  I agree with the assessment, diagnosis, and plan of care documented in the resident's note.  

## 2021-11-17 ENCOUNTER — Other Ambulatory Visit: Payer: Self-pay | Admitting: Student

## 2021-11-17 DIAGNOSIS — E1169 Type 2 diabetes mellitus with other specified complication: Secondary | ICD-10-CM

## 2021-11-17 DIAGNOSIS — G629 Polyneuropathy, unspecified: Secondary | ICD-10-CM

## 2021-11-18 NOTE — Telephone Encounter (Signed)
pregabalin (LYRICA) 100 MG capsule, REFILL REQUEST @ [place], [place] - [address].

## 2021-11-21 ENCOUNTER — Other Ambulatory Visit (HOSPITAL_COMMUNITY): Payer: Self-pay

## 2021-11-21 ENCOUNTER — Other Ambulatory Visit: Payer: Self-pay | Admitting: Student

## 2021-11-21 DIAGNOSIS — E1169 Type 2 diabetes mellitus with other specified complication: Secondary | ICD-10-CM

## 2021-11-21 NOTE — Telephone Encounter (Signed)
Next appt scheduled 12/04/21 with PCP.

## 2021-11-25 ENCOUNTER — Other Ambulatory Visit (HOSPITAL_COMMUNITY): Payer: Self-pay

## 2021-11-25 MED ORDER — JANUMET 50-1000 MG PO TABS
1.0000 | ORAL_TABLET | Freq: Two times a day (BID) | ORAL | 3 refills | Status: DC
  Filled 2021-11-25: qty 60, 30d supply, fill #0

## 2021-12-04 ENCOUNTER — Encounter: Payer: Self-pay | Admitting: Student

## 2021-12-04 ENCOUNTER — Ambulatory Visit: Payer: Self-pay | Admitting: Student

## 2021-12-04 ENCOUNTER — Encounter: Payer: No Typology Code available for payment source | Admitting: Student

## 2021-12-04 ENCOUNTER — Other Ambulatory Visit (HOSPITAL_COMMUNITY): Payer: Self-pay

## 2021-12-04 VITALS — BP 125/79 | HR 100 | Temp 98.4°F | Ht 62.0 in | Wt 290.6 lb

## 2021-12-04 DIAGNOSIS — N3281 Overactive bladder: Secondary | ICD-10-CM

## 2021-12-04 DIAGNOSIS — E1169 Type 2 diabetes mellitus with other specified complication: Secondary | ICD-10-CM

## 2021-12-04 DIAGNOSIS — Z794 Long term (current) use of insulin: Secondary | ICD-10-CM

## 2021-12-04 DIAGNOSIS — E1100 Type 2 diabetes mellitus with hyperosmolarity without nonketotic hyperglycemic-hyperosmolar coma (NKHHC): Secondary | ICD-10-CM

## 2021-12-04 LAB — POCT GLYCOSYLATED HEMOGLOBIN (HGB A1C): Hemoglobin A1C: 7.8 % — AB (ref 4.0–5.6)

## 2021-12-04 LAB — GLUCOSE, CAPILLARY: Glucose-Capillary: 156 mg/dL — ABNORMAL HIGH (ref 70–99)

## 2021-12-04 MED ORDER — FREESTYLE LIBRE 2 SENSOR MISC: 12 refills | Status: DC

## 2021-12-04 MED ORDER — JANUMET 50-1000 MG PO TABS
1.0000 | ORAL_TABLET | Freq: Two times a day (BID) | ORAL | 3 refills | Status: AC
  Filled 2021-12-04 – 2021-12-20 (×2): qty 60, 30d supply, fill #0
  Filled 2022-01-15: qty 60, 30d supply, fill #1

## 2021-12-04 MED ORDER — INSULIN DETEMIR 100 UNIT/ML FLEXPEN: 37.0000 [IU] | PEN_INJECTOR | Freq: Two times a day (BID) | SUBCUTANEOUS | 11 refills | Status: DC

## 2021-12-04 NOTE — Assessment & Plan Note (Addendum)
A1C of 7.8 today.  Reports dietary discrepancies during the holiday.  Report adherent to Levemir 37 units twice daily and NovoLog 25 units with meals.  We rediscussed about SGLT2 and GLP-1.  She was started on Invokana in the past and had severe yeast infection.  Said that she is not willing to take it again.  Ozempic was trialed in the past but was stopped due to GI side effect.    Glucometer was analyzed.  Her CBG was higher in the morning and low at night.  She had no low values.  Foot exam unremarkable.  -Will iincrease her Levemir to 40 units in the morning and 37 units at night -Continue NovoLog 25 units 3 times daily with meals -Continue Janumet -Patient say that she will have health insurance coverage in January with [facility], which will be underway for [place].  She will find a new PCP in January.  Hopefully her insurance will cover GLP-1 such as Trulicity at his appetite.

## 2021-12-04 NOTE — Patient Instructions (Addendum)
Whitney Wagner,  It was a pleasure seeing you today. I am glad that you do have health insurance coverage:  1.  Type 2 diabetes: Your A1c is 7.8 today.  Our goal to have it less than 7.   I will increase your Levemir to 40 units in the morning and 37 units at night.  Please continue NovoLog 25 units with meals.  2.  Overactive bladder: Please try attempt bladder training exercise and continue trospium.  When you have insurance, hopefully you can see a urologist and started on different regimen.  Have a Merry Christmas and Happy Hew Year.   Take care,   Dr. Alfonse Spruce

## 2021-12-04 NOTE — Assessment & Plan Note (Addendum)
Patient reports significant improvement when starting trospium initially. However she started to experience anticholinergic side effect such as dry eyes and dry mouth.  She has been reducing her dose to once daily instead of twice daily.  Today her urinary symptom has improved.  Instead of using her bathroom 3-4 times a night, she only needs to go 1-2 times.  -Hopefully after obtaining insurance, she can see a urologist and started on Myrbetriq. -Continue trospium for now -Advised patient on bladder training exercise

## 2021-12-04 NOTE — Progress Notes (Signed)
° °  CC: F/u on DM  HPI:  Whitney Wagner is a 45 y.o. with past medical history of hypertension, type 2 diabetes, HS who presents to the clinic today for follow-up on her diabetes.  Please see problem based charting for detail.   Past Medical History:  Diagnosis Date   Arthritis    "beginnings in my hands" (05/26/2013)   Diabetes mellitus without complication ([place]) 8101   GERD (gastroesophageal reflux disease)    otc meds prn   Golfer's elbow, right 11/06/2017   Migraines, neuralgic    '~ 3/week" (05/26/2013) - otc meds prn   Morbid obesity ([place])    Osgood-Schlatter's disease of left knee    left knee   PCOS (polycystic ovarian syndrome)    Poison ivy dermatitis 07/27/2019   Seasonal allergies    Sinusitis, acute maxillary 07/06/2020   Subacromial bursitis of left shoulder joint 11/28/2019   Tooth pain 05/26/2013   "bottom right" (05/26/2013)   Uterine fibroid    Review of Systems:  per HPI  Physical Exam:  Vitals:   12/04/21 1349  BP: 125/79  Pulse: 100  Temp: 98.4 F (36.9 C)  TempSrc: Oral  SpO2: 100%  Weight: 290 lb 9.6 oz (131.8 kg)  Height: 5\' 2"  (1.575 m)   Physical Exam Constitutional:      General: She is not in acute distress.    Appearance: She is not ill-appearing.  Eyes:     General:        Right eye: No discharge.        Left eye: No discharge.     Conjunctiva/sclera: Conjunctivae normal.  Cardiovascular:     Rate and Rhythm: Normal rate and regular rhythm.     Heart sounds: Normal heart sounds.  Pulmonary:     Effort: Pulmonary effort is normal. No respiratory distress.     Breath sounds: Normal breath sounds. No wheezing.  Abdominal:     General: Bowel sounds are normal.     Palpations: Abdomen is soft.  Musculoskeletal:        General: Normal range of motion.     Comments: No wounds or ulcer of bilat feet. Warm to touch. +2 dorsalis pedis pulses palpated.   Skin:    General: Skin is warm.  Neurological:     General: No focal  deficit present.     Mental Status: She is alert and oriented to person, place, and time.  Psychiatric:        Mood and Affect: Mood normal.        Behavior: Behavior normal.     Assessment & Plan:   See Encounters Tab for problem based charting.  Patient discussed with Dr.  Saverio Danker

## 2021-12-09 NOTE — Progress Notes (Signed)
Internal Medicine Clinic Attending  Case discussed with Dr. Alfonse Spruce  At the time of the visit.  We reviewed the residents history and exam and pertinent patient test results.  I agree with the assessment, diagnosis, and plan of care documented in the residents note. Reviewed glucometer readings. Daily readings mostly in the 200s. Average glucose 200, with 91% of the time above target range and 0% below in the past 30 days. Lower in the evening but still above goal.

## 2021-12-11 ENCOUNTER — Other Ambulatory Visit: Payer: Self-pay

## 2021-12-11 DIAGNOSIS — Z794 Long term (current) use of insulin: Secondary | ICD-10-CM

## 2021-12-11 MED ORDER — FREESTYLE LIBRE 14 DAY READER DEVI
0 refills | Status: AC
Start: 2021-12-11 — End: ?

## 2021-12-11 NOTE — Addendum Note (Signed)
Addended byGaylan Gerold on: 12/11/2021 11:39 AM   Modules accepted: Orders

## 2021-12-18 ENCOUNTER — Other Ambulatory Visit: Payer: Self-pay | Admitting: Student

## 2021-12-18 DIAGNOSIS — Z794 Long term (current) use of insulin: Secondary | ICD-10-CM

## 2021-12-18 NOTE — Telephone Encounter (Signed)
Continuous Blood Gluc Receiver (FREESTYLE LIBRE 14 DAY READER) DEVI, REFILL REQUEST @ [place], [place].

## 2021-12-18 NOTE — Telephone Encounter (Signed)
It looks like we sent in a prescription for a Freestyle libre 2 2 weeks ago and she uses a [employer] 14 day( the original one). So pharmacy requesting the one she uses on her behalf.  Hope this helps.

## 2021-12-20 ENCOUNTER — Other Ambulatory Visit: Payer: Self-pay | Admitting: Internal Medicine

## 2021-12-20 ENCOUNTER — Other Ambulatory Visit: Payer: Self-pay | Admitting: Student

## 2021-12-20 ENCOUNTER — Other Ambulatory Visit (HOSPITAL_COMMUNITY): Payer: Self-pay

## 2021-12-20 DIAGNOSIS — E1100 Type 2 diabetes mellitus with hyperosmolarity without nonketotic hyperglycemic-hyperosmolar coma (NKHHC): Secondary | ICD-10-CM

## 2021-12-20 DIAGNOSIS — I10 Essential (primary) hypertension: Secondary | ICD-10-CM

## 2021-12-20 MED ORDER — INSULIN DETEMIR 100 UNIT/ML FLEXPEN
PEN_INJECTOR | SUBCUTANEOUS | 11 refills | Status: AC
  Filled 2021-12-20: qty 21, 27d supply, fill #0
  Filled 2022-01-15: qty 21, 27d supply, fill #1

## 2021-12-23 ENCOUNTER — Other Ambulatory Visit (HOSPITAL_COMMUNITY): Payer: Self-pay

## 2021-12-23 ENCOUNTER — Other Ambulatory Visit: Payer: Self-pay | Admitting: Student

## 2021-12-23 DIAGNOSIS — I10 Essential (primary) hypertension: Secondary | ICD-10-CM

## 2021-12-27 MED ORDER — LISINOPRIL 10 MG PO TABS
10.0000 mg | ORAL_TABLET | Freq: Every day | ORAL | 4 refills | Status: AC
  Filled 2021-12-27: qty 30, 30d supply, fill #0

## 2021-12-30 ENCOUNTER — Other Ambulatory Visit (HOSPITAL_COMMUNITY): Payer: Self-pay

## 2021-12-31 IMAGING — CT CT ABD-PELV W/ CM
2 of 5 series · 17 of 46 positions shown, 19 images · IV contrast (APPLIED)
Comparison: 06/09/2021

CLINICAL DATA: Diarrhea, hematuria.

EXAM:
CT ABDOMEN AND PELVIS WITH CONTRAST
TECHNIQUE: Multidetector CT imaging of the abdomen and pelvis was performed
using the standard protocol following bolus administration of
intravenous contrast.
CONTRAST:  100mL OMNIPAQUE IOHEXOL 300 MG/ML  SOLN

[Series 3: abd/ pelvis 5.0 i30f 2 · axial · 0.98mm/px · z∈[-761,-331]mm · 14 of 98 slices shown, 16 images]
[im 6/98  soft-tissue]
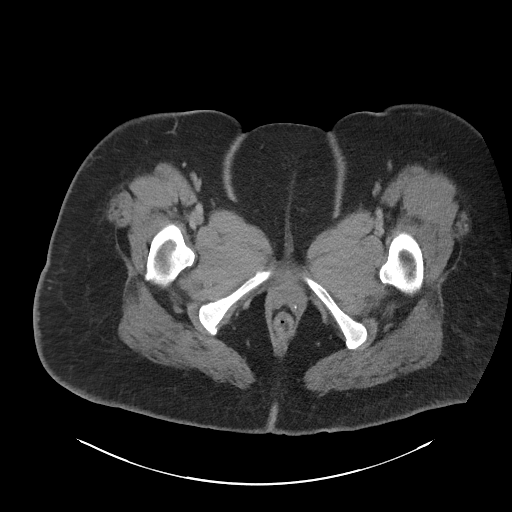
[im 6/98  bone]
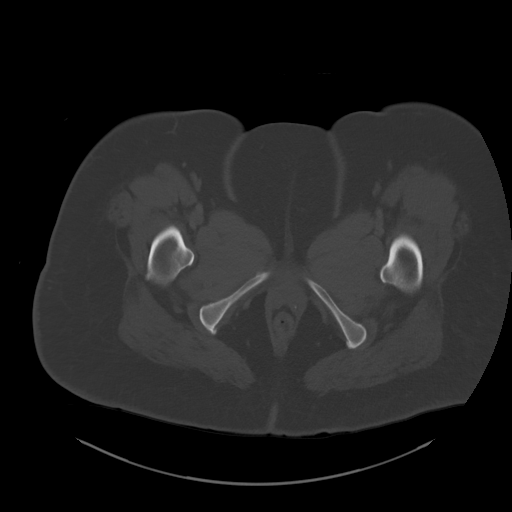
[im 11/98  soft-tissue]
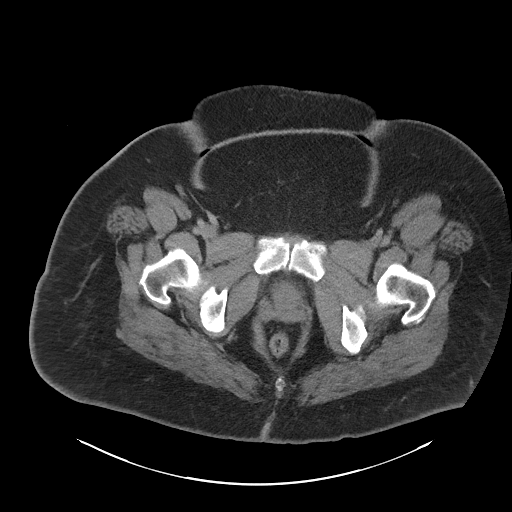
[im 21/98  soft-tissue]
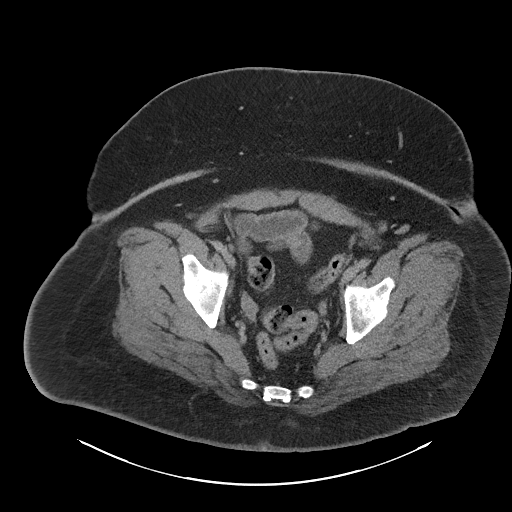
[im 26/98  soft-tissue]
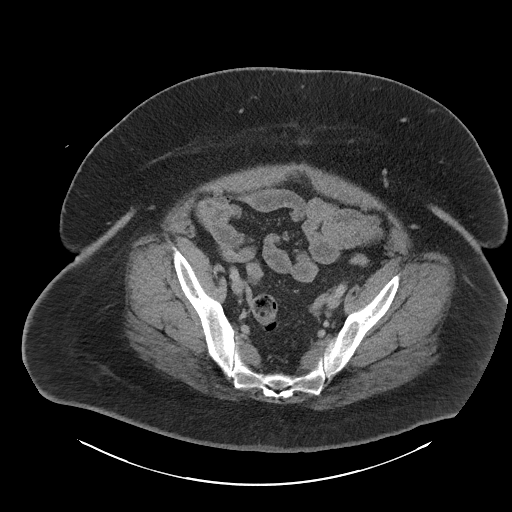
[im 31/98  soft-tissue]
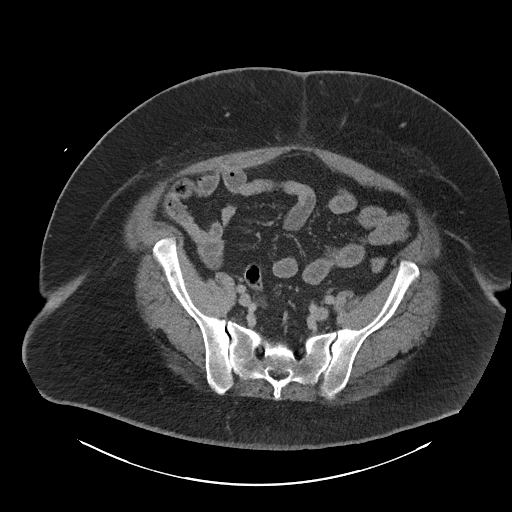
[im 41/98  soft-tissue]
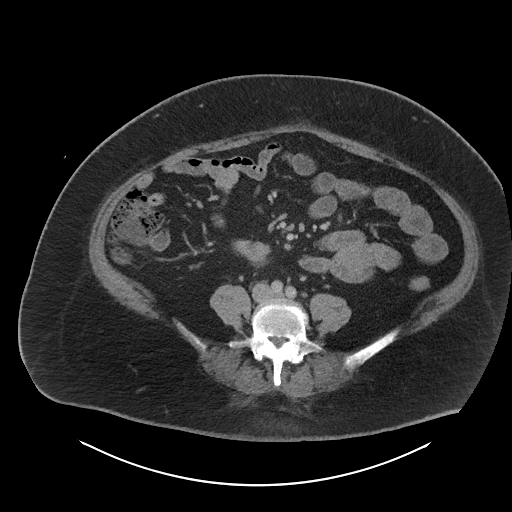
[im 46/98  soft-tissue]
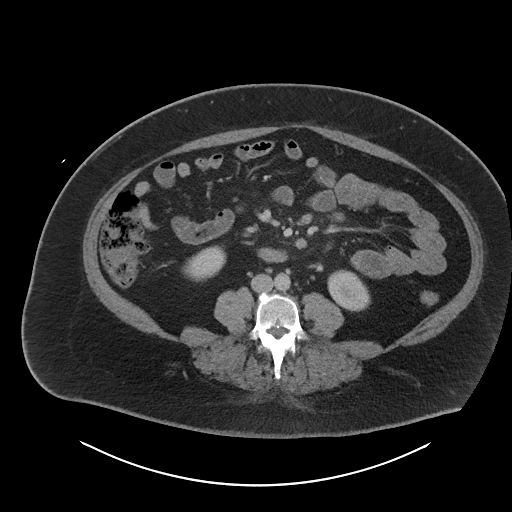
[im 52/98  soft-tissue]
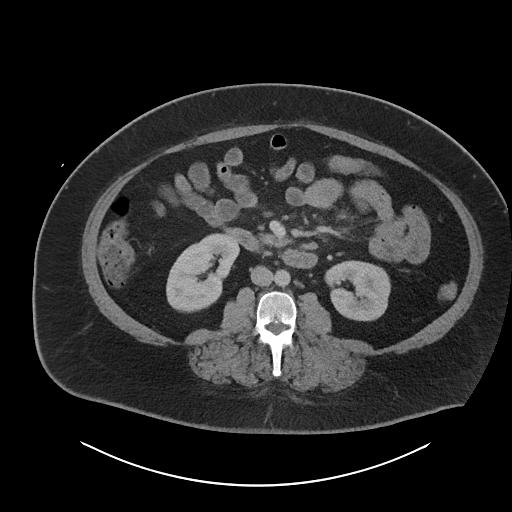
[im 57/98  soft-tissue]
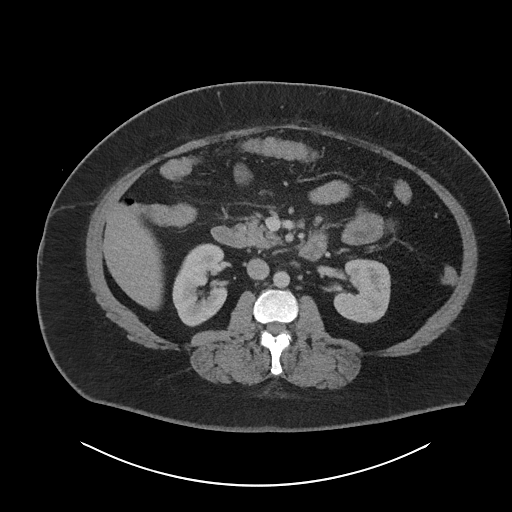
[im 57/98  bone]
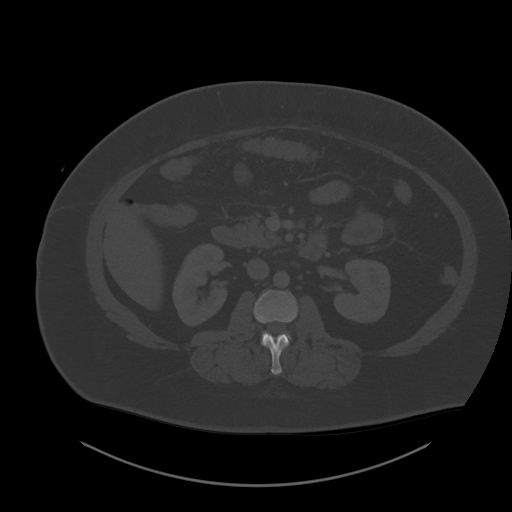
[im 67/98  soft-tissue]
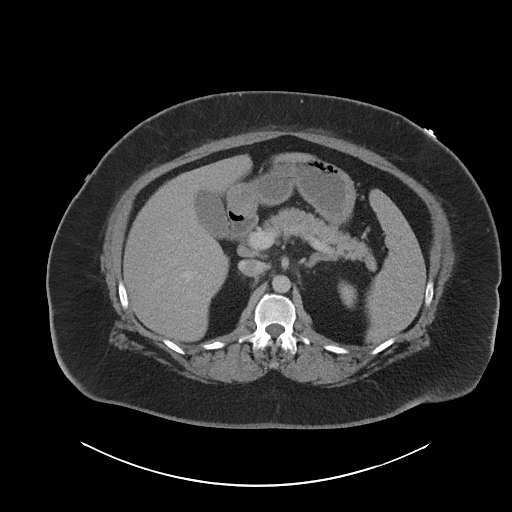
[im 72/98  soft-tissue]
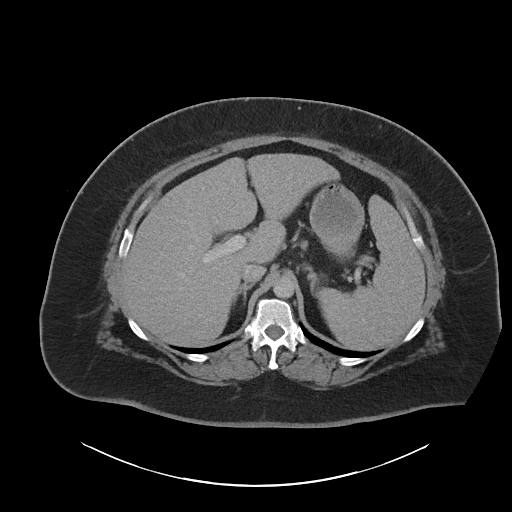
[im 77/98  soft-tissue]
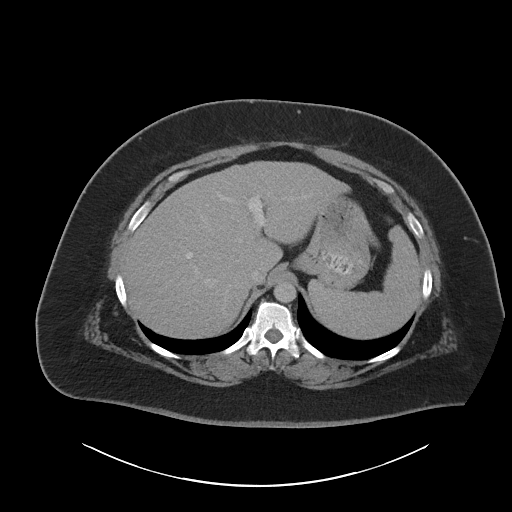
[im 87/98  soft-tissue]
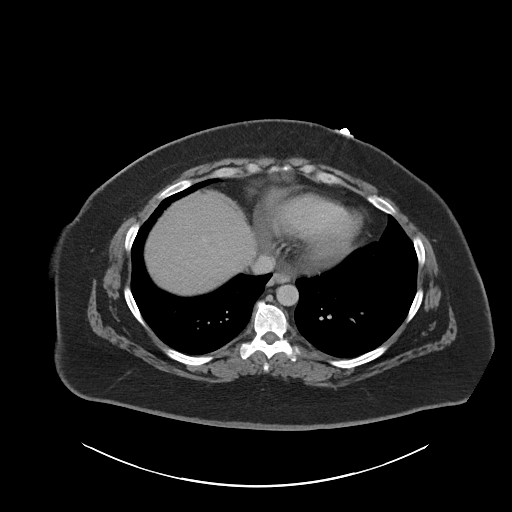
[im 92/98  soft-tissue]
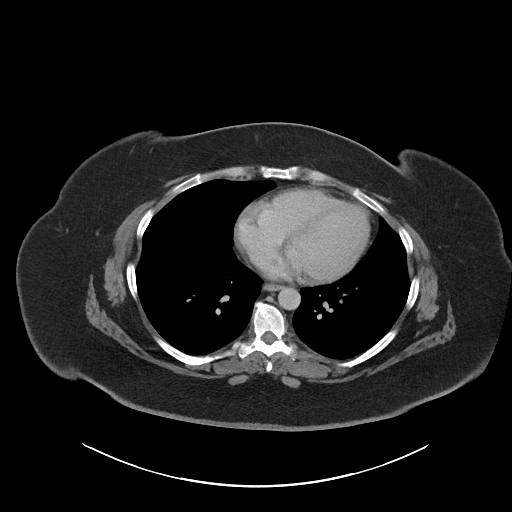

[Series 6: coronal soft tissue · coronal · 0.97mm/px · 3 of 116 slices shown]
[im 39/116  soft-tissue]
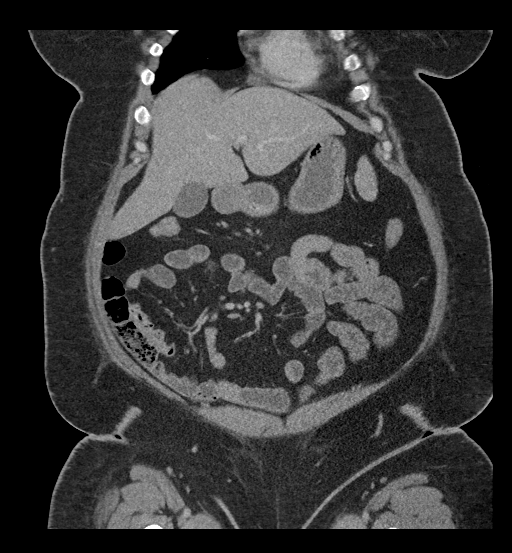
[im 52/116  soft-tissue]
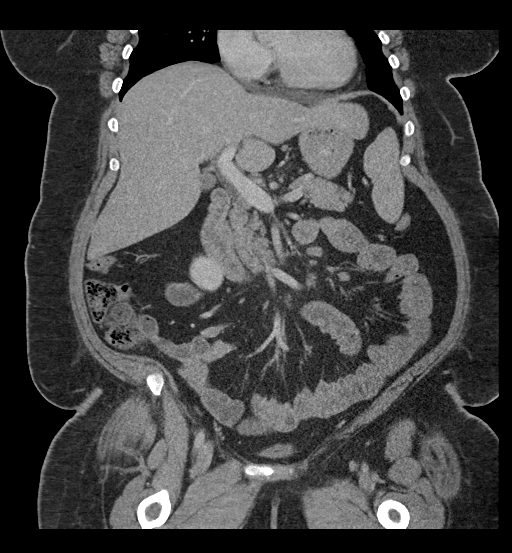
[im 64/116  soft-tissue]
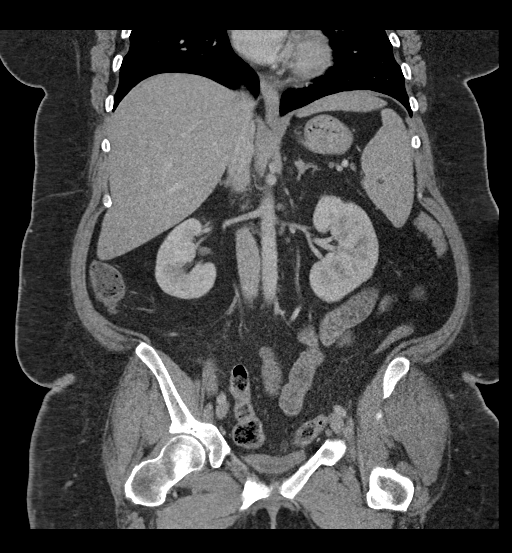

[17 of 46 positions shown; findings below may reference images not displayed]

FINDINGS: Lower chest: Lung bases are clear.

Hepatobiliary: No focal hepatic lesion. No biliary duct dilatation.
Common bile duct is normal.

Pancreas: Pancreas is normal. No ductal dilatation. No pancreatic
inflammation.

Spleen: Normal spleen

Adrenals/urinary tract: Small fat containing lesion of the LEFT
adrenal gland is unchanged. Kidneys normal. Ureters and bladder
normal.

Stomach/Bowel: Stomach, small bowel, appendix, and cecum are normal.
The colon and rectosigmoid colon are normal.

Vascular/Lymphatic: Abdominal aorta is normal caliber. No periportal
or retroperitoneal adenopathy. No pelvic adenopathy.

Reproductive: Post hysterectomy.  Adnexa unremarkable

Other: No free fluid.

Musculoskeletal: No aggressive osseous lesion.
IMPRESSION: 1. Normal kidneys, ureters and bladder.
2. Normal small bowel and colon.
3. Small benign LEFT adrenal myelolipoma.

## 2022-01-01 ENCOUNTER — Other Ambulatory Visit (HOSPITAL_COMMUNITY): Payer: Self-pay

## 2022-01-09 ENCOUNTER — Other Ambulatory Visit: Payer: Self-pay | Admitting: Internal Medicine

## 2022-01-09 DIAGNOSIS — Z794 Long term (current) use of insulin: Secondary | ICD-10-CM

## 2022-01-09 DIAGNOSIS — E1169 Type 2 diabetes mellitus with other specified complication: Secondary | ICD-10-CM

## 2022-01-15 ENCOUNTER — Other Ambulatory Visit (HOSPITAL_COMMUNITY): Payer: Self-pay

## 2022-01-15 ENCOUNTER — Other Ambulatory Visit: Payer: Self-pay | Admitting: Student

## 2022-01-15 DIAGNOSIS — K219 Gastro-esophageal reflux disease without esophagitis: Secondary | ICD-10-CM

## 2022-01-15 DIAGNOSIS — E785 Hyperlipidemia, unspecified: Secondary | ICD-10-CM

## 2022-01-16 ENCOUNTER — Other Ambulatory Visit (HOSPITAL_COMMUNITY): Payer: Self-pay

## 2022-01-16 ENCOUNTER — Other Ambulatory Visit: Payer: Self-pay | Admitting: Student

## 2022-01-16 DIAGNOSIS — E785 Hyperlipidemia, unspecified: Secondary | ICD-10-CM

## 2022-01-16 DIAGNOSIS — K219 Gastro-esophageal reflux disease without esophagitis: Secondary | ICD-10-CM

## 2022-01-17 ENCOUNTER — Other Ambulatory Visit (HOSPITAL_COMMUNITY): Payer: Self-pay

## 2022-01-17 MED ORDER — PANTOPRAZOLE SODIUM 40 MG PO TBEC
40.0000 mg | DELAYED_RELEASE_TABLET | Freq: Every day | ORAL | 0 refills | Status: AC
  Filled 2022-01-17: qty 30, 30d supply, fill #0

## 2022-01-17 MED ORDER — ATORVASTATIN CALCIUM 20 MG PO TABS
20.0000 mg | ORAL_TABLET | Freq: Every day | ORAL | 3 refills | Status: AC
  Filled 2022-01-17: qty 30, 30d supply, fill #0

## 2022-01-20 ENCOUNTER — Other Ambulatory Visit (HOSPITAL_COMMUNITY): Payer: Self-pay

## 2022-01-23 ENCOUNTER — Other Ambulatory Visit (HOSPITAL_COMMUNITY): Payer: Self-pay

## 2022-06-07 ENCOUNTER — Other Ambulatory Visit: Payer: Self-pay | Admitting: Student

## 2022-06-07 DIAGNOSIS — E785 Hyperlipidemia, unspecified: Secondary | ICD-10-CM

## 2022-07-21 ENCOUNTER — Other Ambulatory Visit: Payer: Self-pay | Admitting: Student

## 2022-07-21 DIAGNOSIS — I10 Essential (primary) hypertension: Secondary | ICD-10-CM

## 2022-09-23 ENCOUNTER — Other Ambulatory Visit (HOSPITAL_COMMUNITY): Payer: Self-pay
